# Patient Record
Sex: Male | Born: 1945
Health system: Southern US, Community
[De-identification: ages and names within clinical notes are randomized; demographics above are authoritative.]

## PROBLEM LIST (undated history)

## (undated) ENCOUNTER — Emergency Department: Discharge status: Absent without leave | Payer: Medicare HMO

## (undated) DIAGNOSIS — T8859XA Other complications of anesthesia, initial encounter: Secondary | ICD-10-CM

## (undated) DIAGNOSIS — M48061 Spinal stenosis, lumbar region without neurogenic claudication: Secondary | ICD-10-CM

## (undated) DIAGNOSIS — R252 Cramp and spasm: Secondary | ICD-10-CM

## (undated) DIAGNOSIS — F32A Depression, unspecified: Secondary | ICD-10-CM

## (undated) DIAGNOSIS — R0609 Other forms of dyspnea: Secondary | ICD-10-CM

## (undated) DIAGNOSIS — E119 Type 2 diabetes mellitus without complications: Secondary | ICD-10-CM

## (undated) DIAGNOSIS — E7800 Pure hypercholesterolemia, unspecified: Secondary | ICD-10-CM

## (undated) DIAGNOSIS — N529 Male erectile dysfunction, unspecified: Secondary | ICD-10-CM

## (undated) DIAGNOSIS — Z8489 Family history of other specified conditions: Secondary | ICD-10-CM

## (undated) DIAGNOSIS — M545 Low back pain, unspecified: Secondary | ICD-10-CM

## (undated) DIAGNOSIS — M1711 Unilateral primary osteoarthritis, right knee: Secondary | ICD-10-CM

## (undated) DIAGNOSIS — D869 Sarcoidosis, unspecified: Secondary | ICD-10-CM

## (undated) DIAGNOSIS — F329 Major depressive disorder, single episode, unspecified: Secondary | ICD-10-CM

## (undated) DIAGNOSIS — Z9889 Other specified postprocedural states: Secondary | ICD-10-CM

## (undated) DIAGNOSIS — I2510 Atherosclerotic heart disease of native coronary artery without angina pectoris: Secondary | ICD-10-CM

## (undated) DIAGNOSIS — E785 Hyperlipidemia, unspecified: Secondary | ICD-10-CM

## (undated) DIAGNOSIS — Z79899 Other long term (current) drug therapy: Secondary | ICD-10-CM

## (undated) DIAGNOSIS — M25561 Pain in right knee: Secondary | ICD-10-CM

## (undated) DIAGNOSIS — K760 Fatty (change of) liver, not elsewhere classified: Secondary | ICD-10-CM

## (undated) DIAGNOSIS — G4730 Sleep apnea, unspecified: Secondary | ICD-10-CM

## (undated) DIAGNOSIS — R413 Other amnesia: Secondary | ICD-10-CM

## (undated) DIAGNOSIS — I4510 Unspecified right bundle-branch block: Secondary | ICD-10-CM

## (undated) DIAGNOSIS — I82509 Chronic embolism and thrombosis of unspecified deep veins of unspecified lower extremity: Secondary | ICD-10-CM

## (undated) DIAGNOSIS — I10 Essential (primary) hypertension: Secondary | ICD-10-CM

## (undated) DIAGNOSIS — H25019 Cortical age-related cataract, unspecified eye: Secondary | ICD-10-CM

## (undated) DIAGNOSIS — F325 Major depressive disorder, single episode, in full remission: Secondary | ICD-10-CM

## (undated) DIAGNOSIS — Z7960 Long term (current) use of unspecified immunomodulators and immunosuppressants: Secondary | ICD-10-CM

## (undated) DIAGNOSIS — G8929 Other chronic pain: Secondary | ICD-10-CM

## (undated) DIAGNOSIS — Z8619 Personal history of other infectious and parasitic diseases: Secondary | ICD-10-CM

## (undated) DIAGNOSIS — G2581 Restless legs syndrome: Secondary | ICD-10-CM

## (undated) DIAGNOSIS — E669 Obesity, unspecified: Secondary | ICD-10-CM

## (undated) DIAGNOSIS — K649 Unspecified hemorrhoids: Secondary | ICD-10-CM

## (undated) DIAGNOSIS — F419 Anxiety disorder, unspecified: Secondary | ICD-10-CM

## (undated) DIAGNOSIS — Z7901 Long term (current) use of anticoagulants: Secondary | ICD-10-CM

## (undated) DIAGNOSIS — T4145XA Adverse effect of unspecified anesthetic, initial encounter: Secondary | ICD-10-CM

## (undated) DIAGNOSIS — I700 Atherosclerosis of aorta: Secondary | ICD-10-CM

## (undated) DIAGNOSIS — G4700 Insomnia, unspecified: Secondary | ICD-10-CM

## (undated) DIAGNOSIS — M1990 Unspecified osteoarthritis, unspecified site: Secondary | ICD-10-CM

## (undated) DIAGNOSIS — R110 Nausea: Secondary | ICD-10-CM

## (undated) DIAGNOSIS — E118 Type 2 diabetes mellitus with unspecified complications: Secondary | ICD-10-CM

## (undated) DIAGNOSIS — R918 Other nonspecific abnormal finding of lung field: Secondary | ICD-10-CM

## (undated) DIAGNOSIS — K219 Gastro-esophageal reflux disease without esophagitis: Secondary | ICD-10-CM

## (undated) DIAGNOSIS — I82409 Acute embolism and thrombosis of unspecified deep veins of unspecified lower extremity: Secondary | ICD-10-CM

## (undated) HISTORY — PX: EYE SURGERY: SHX253

## (undated) HISTORY — PX: CATARACT EXTRACTION, BILATERAL: SHX1313

## (undated) HISTORY — PX: TONSILLECTOMY: SUR1361

---

## 2010-06-14 ENCOUNTER — Other Ambulatory Visit: Payer: Self-pay | Admitting: Orthopedic Surgery

## 2010-06-14 ENCOUNTER — Other Ambulatory Visit: Payer: Self-pay

## 2010-06-14 DIAGNOSIS — M25531 Pain in right wrist: Secondary | ICD-10-CM

## 2010-10-29 ENCOUNTER — Other Ambulatory Visit (HOSPITAL_COMMUNITY): Payer: Self-pay | Admitting: Physical Medicine and Rehabilitation

## 2010-10-29 DIAGNOSIS — M545 Low back pain: Secondary | ICD-10-CM

## 2010-11-11 ENCOUNTER — Ambulatory Visit (HOSPITAL_COMMUNITY)
Admission: RE | Admit: 2010-11-11 | Discharge: 2010-11-11 | Disposition: A | Discharge status: Home / Self Care | Payer: Managed Care, Other (non HMO) | Source: Ambulatory Visit | Attending: Physical Medicine and Rehabilitation | Admitting: Physical Medicine and Rehabilitation

## 2010-11-11 ENCOUNTER — Encounter (HOSPITAL_COMMUNITY)
Admission: RE | Admit: 2010-11-11 | Discharge: 2010-11-11 | Disposition: A | Discharge status: Home / Self Care | Payer: Managed Care, Other (non HMO) | Source: Ambulatory Visit | Attending: Physical Medicine and Rehabilitation | Admitting: Physical Medicine and Rehabilitation

## 2010-11-11 DIAGNOSIS — M545 Low back pain, unspecified: Secondary | ICD-10-CM | POA: Insufficient documentation

## 2010-11-11 DIAGNOSIS — G8929 Other chronic pain: Secondary | ICD-10-CM | POA: Insufficient documentation

## 2010-11-11 DIAGNOSIS — R948 Abnormal results of function studies of other organs and systems: Secondary | ICD-10-CM | POA: Insufficient documentation

## 2010-11-11 MED ORDER — TECHNETIUM TC 99M MEDRONATE IV KIT
25.0000 | PACK | Freq: Once | INTRAVENOUS | Status: AC | PRN
  Administered 2010-11-11: 25 via INTRAVENOUS

## 2011-03-20 ENCOUNTER — Ambulatory Visit
Admission: RE | Admit: 2011-03-20 | Discharge: 2011-03-20 | Disposition: A | Discharge status: Home / Self Care | Payer: Managed Care, Other (non HMO) | Source: Ambulatory Visit | Attending: Neurosurgery | Admitting: Neurosurgery

## 2011-03-20 ENCOUNTER — Other Ambulatory Visit: Payer: Self-pay | Admitting: Neurosurgery

## 2011-03-20 DIAGNOSIS — M545 Low back pain: Secondary | ICD-10-CM

## 2012-04-14 HISTORY — PX: ACHILLES TENDON REPAIR: SUR1153

## 2013-12-09 DIAGNOSIS — M545 Low back pain, unspecified: Secondary | ICD-10-CM | POA: Insufficient documentation

## 2013-12-09 DIAGNOSIS — I10 Essential (primary) hypertension: Secondary | ICD-10-CM | POA: Insufficient documentation

## 2013-12-09 DIAGNOSIS — M533 Sacrococcygeal disorders, not elsewhere classified: Secondary | ICD-10-CM | POA: Insufficient documentation

## 2013-12-28 DIAGNOSIS — M48061 Spinal stenosis, lumbar region without neurogenic claudication: Secondary | ICD-10-CM | POA: Insufficient documentation

## 2013-12-28 DIAGNOSIS — M47816 Spondylosis without myelopathy or radiculopathy, lumbar region: Secondary | ICD-10-CM | POA: Insufficient documentation

## 2014-05-30 DIAGNOSIS — F325 Major depressive disorder, single episode, in full remission: Secondary | ICD-10-CM | POA: Insufficient documentation

## 2014-05-30 DIAGNOSIS — E119 Type 2 diabetes mellitus without complications: Secondary | ICD-10-CM | POA: Insufficient documentation

## 2014-05-30 DIAGNOSIS — I10 Essential (primary) hypertension: Secondary | ICD-10-CM | POA: Insufficient documentation

## 2014-08-20 DIAGNOSIS — F5111 Primary hypersomnia: Secondary | ICD-10-CM | POA: Diagnosis not present

## 2014-08-20 DIAGNOSIS — G4710 Hypersomnia, unspecified: Secondary | ICD-10-CM | POA: Diagnosis not present

## 2014-09-01 DIAGNOSIS — G4733 Obstructive sleep apnea (adult) (pediatric): Secondary | ICD-10-CM | POA: Diagnosis not present

## 2014-09-19 DIAGNOSIS — H43313 Vitreous membranes and strands, bilateral: Secondary | ICD-10-CM | POA: Diagnosis not present

## 2014-09-19 DIAGNOSIS — H5210 Myopia, unspecified eye: Secondary | ICD-10-CM | POA: Diagnosis not present

## 2014-09-19 DIAGNOSIS — H5203 Hypermetropia, bilateral: Secondary | ICD-10-CM | POA: Diagnosis not present

## 2014-09-21 DIAGNOSIS — L219 Seborrheic dermatitis, unspecified: Secondary | ICD-10-CM | POA: Diagnosis not present

## 2014-09-21 DIAGNOSIS — L821 Other seborrheic keratosis: Secondary | ICD-10-CM | POA: Diagnosis not present

## 2014-09-21 DIAGNOSIS — D229 Melanocytic nevi, unspecified: Secondary | ICD-10-CM | POA: Diagnosis not present

## 2014-09-21 DIAGNOSIS — L718 Other rosacea: Secondary | ICD-10-CM | POA: Diagnosis not present

## 2014-09-21 DIAGNOSIS — D1800 Hemangioma unspecified site: Secondary | ICD-10-CM | POA: Diagnosis not present

## 2014-09-21 DIAGNOSIS — L905 Scar conditions and fibrosis of skin: Secondary | ICD-10-CM | POA: Diagnosis not present

## 2014-09-21 DIAGNOSIS — Z1283 Encounter for screening for malignant neoplasm of skin: Secondary | ICD-10-CM | POA: Diagnosis not present

## 2014-09-21 DIAGNOSIS — L304 Erythema intertrigo: Secondary | ICD-10-CM | POA: Diagnosis not present

## 2014-10-02 DIAGNOSIS — G4733 Obstructive sleep apnea (adult) (pediatric): Secondary | ICD-10-CM | POA: Diagnosis not present

## 2014-10-24 DIAGNOSIS — G2581 Restless legs syndrome: Secondary | ICD-10-CM | POA: Diagnosis not present

## 2014-10-24 DIAGNOSIS — G4733 Obstructive sleep apnea (adult) (pediatric): Secondary | ICD-10-CM | POA: Diagnosis not present

## 2014-10-24 DIAGNOSIS — G479 Sleep disorder, unspecified: Secondary | ICD-10-CM | POA: Diagnosis not present

## 2014-10-31 DIAGNOSIS — M654 Radial styloid tenosynovitis [de Quervain]: Secondary | ICD-10-CM | POA: Diagnosis not present

## 2014-10-31 DIAGNOSIS — M25531 Pain in right wrist: Secondary | ICD-10-CM | POA: Diagnosis not present

## 2014-10-31 DIAGNOSIS — N403 Nodular prostate with lower urinary tract symptoms: Secondary | ICD-10-CM | POA: Diagnosis not present

## 2014-10-31 DIAGNOSIS — G479 Sleep disorder, unspecified: Secondary | ICD-10-CM | POA: Diagnosis not present

## 2014-11-27 DIAGNOSIS — G4733 Obstructive sleep apnea (adult) (pediatric): Secondary | ICD-10-CM | POA: Diagnosis not present

## 2014-11-27 DIAGNOSIS — M25511 Pain in right shoulder: Secondary | ICD-10-CM | POA: Diagnosis not present

## 2014-12-01 DIAGNOSIS — G4733 Obstructive sleep apnea (adult) (pediatric): Secondary | ICD-10-CM | POA: Diagnosis not present

## 2014-12-19 DIAGNOSIS — N401 Enlarged prostate with lower urinary tract symptoms: Secondary | ICD-10-CM | POA: Diagnosis not present

## 2014-12-19 DIAGNOSIS — N138 Other obstructive and reflux uropathy: Secondary | ICD-10-CM | POA: Diagnosis not present

## 2014-12-21 DIAGNOSIS — E780 Pure hypercholesterolemia: Secondary | ICD-10-CM | POA: Diagnosis not present

## 2014-12-21 DIAGNOSIS — E119 Type 2 diabetes mellitus without complications: Secondary | ICD-10-CM | POA: Diagnosis not present

## 2014-12-21 DIAGNOSIS — Z79899 Other long term (current) drug therapy: Secondary | ICD-10-CM | POA: Diagnosis not present

## 2014-12-28 DIAGNOSIS — G8929 Other chronic pain: Secondary | ICD-10-CM | POA: Diagnosis not present

## 2014-12-28 DIAGNOSIS — M546 Pain in thoracic spine: Secondary | ICD-10-CM | POA: Diagnosis not present

## 2014-12-28 DIAGNOSIS — M25511 Pain in right shoulder: Secondary | ICD-10-CM | POA: Diagnosis not present

## 2014-12-29 DIAGNOSIS — Z125 Encounter for screening for malignant neoplasm of prostate: Secondary | ICD-10-CM | POA: Diagnosis not present

## 2014-12-29 DIAGNOSIS — G8929 Other chronic pain: Secondary | ICD-10-CM | POA: Insufficient documentation

## 2014-12-29 DIAGNOSIS — E7800 Pure hypercholesterolemia, unspecified: Secondary | ICD-10-CM | POA: Insufficient documentation

## 2014-12-29 DIAGNOSIS — E119 Type 2 diabetes mellitus without complications: Secondary | ICD-10-CM | POA: Diagnosis not present

## 2014-12-29 DIAGNOSIS — M25511 Pain in right shoulder: Secondary | ICD-10-CM

## 2014-12-29 DIAGNOSIS — Z79899 Other long term (current) drug therapy: Secondary | ICD-10-CM | POA: Diagnosis not present

## 2014-12-29 DIAGNOSIS — I10 Essential (primary) hypertension: Secondary | ICD-10-CM | POA: Diagnosis not present

## 2015-01-01 ENCOUNTER — Other Ambulatory Visit: Payer: Self-pay | Admitting: Student

## 2015-01-01 DIAGNOSIS — M25511 Pain in right shoulder: Secondary | ICD-10-CM

## 2015-01-01 DIAGNOSIS — G4733 Obstructive sleep apnea (adult) (pediatric): Secondary | ICD-10-CM | POA: Diagnosis not present

## 2015-01-02 DIAGNOSIS — R3912 Poor urinary stream: Secondary | ICD-10-CM | POA: Diagnosis not present

## 2015-01-02 DIAGNOSIS — R3915 Urgency of urination: Secondary | ICD-10-CM | POA: Diagnosis not present

## 2015-01-02 DIAGNOSIS — R351 Nocturia: Secondary | ICD-10-CM | POA: Diagnosis not present

## 2015-01-05 DIAGNOSIS — M546 Pain in thoracic spine: Secondary | ICD-10-CM | POA: Diagnosis not present

## 2015-01-09 ENCOUNTER — Ambulatory Visit: Payer: Managed Care, Other (non HMO)

## 2015-01-09 DIAGNOSIS — N138 Other obstructive and reflux uropathy: Secondary | ICD-10-CM | POA: Diagnosis not present

## 2015-01-09 DIAGNOSIS — N3281 Overactive bladder: Secondary | ICD-10-CM | POA: Diagnosis not present

## 2015-01-09 DIAGNOSIS — N401 Enlarged prostate with lower urinary tract symptoms: Secondary | ICD-10-CM | POA: Diagnosis not present

## 2015-01-10 ENCOUNTER — Ambulatory Visit
Admission: RE | Admit: 2015-01-10 | Discharge: 2015-01-10 | Disposition: A | Discharge status: Home / Self Care | Payer: Commercial Managed Care - HMO | Source: Ambulatory Visit | Attending: Student | Admitting: Student

## 2015-01-10 DIAGNOSIS — M25511 Pain in right shoulder: Secondary | ICD-10-CM | POA: Insufficient documentation

## 2015-01-10 DIAGNOSIS — M75101 Unspecified rotator cuff tear or rupture of right shoulder, not specified as traumatic: Secondary | ICD-10-CM | POA: Insufficient documentation

## 2015-01-10 DIAGNOSIS — G8929 Other chronic pain: Secondary | ICD-10-CM | POA: Insufficient documentation

## 2015-01-24 DIAGNOSIS — Z23 Encounter for immunization: Secondary | ICD-10-CM | POA: Diagnosis not present

## 2015-01-25 DIAGNOSIS — M7581 Other shoulder lesions, right shoulder: Secondary | ICD-10-CM | POA: Diagnosis not present

## 2015-01-25 DIAGNOSIS — M75121 Complete rotator cuff tear or rupture of right shoulder, not specified as traumatic: Secondary | ICD-10-CM | POA: Diagnosis not present

## 2015-01-31 DIAGNOSIS — G4733 Obstructive sleep apnea (adult) (pediatric): Secondary | ICD-10-CM | POA: Diagnosis not present

## 2015-03-03 DIAGNOSIS — G4733 Obstructive sleep apnea (adult) (pediatric): Secondary | ICD-10-CM | POA: Diagnosis not present

## 2015-03-14 DIAGNOSIS — R69 Illness, unspecified: Secondary | ICD-10-CM | POA: Diagnosis not present

## 2015-03-27 DIAGNOSIS — H2513 Age-related nuclear cataract, bilateral: Secondary | ICD-10-CM | POA: Diagnosis not present

## 2015-04-02 DIAGNOSIS — G4733 Obstructive sleep apnea (adult) (pediatric): Secondary | ICD-10-CM | POA: Diagnosis not present

## 2015-05-03 DIAGNOSIS — G4733 Obstructive sleep apnea (adult) (pediatric): Secondary | ICD-10-CM | POA: Diagnosis not present

## 2015-06-03 DIAGNOSIS — G4733 Obstructive sleep apnea (adult) (pediatric): Secondary | ICD-10-CM | POA: Diagnosis not present

## 2015-06-04 DIAGNOSIS — M75121 Complete rotator cuff tear or rupture of right shoulder, not specified as traumatic: Secondary | ICD-10-CM | POA: Diagnosis not present

## 2015-06-04 DIAGNOSIS — M7581 Other shoulder lesions, right shoulder: Secondary | ICD-10-CM | POA: Diagnosis not present

## 2015-07-03 DIAGNOSIS — Z125 Encounter for screening for malignant neoplasm of prostate: Secondary | ICD-10-CM | POA: Diagnosis not present

## 2015-07-03 DIAGNOSIS — Z79899 Other long term (current) drug therapy: Secondary | ICD-10-CM | POA: Diagnosis not present

## 2015-07-03 DIAGNOSIS — E7800 Pure hypercholesterolemia, unspecified: Secondary | ICD-10-CM | POA: Diagnosis not present

## 2015-07-03 DIAGNOSIS — E119 Type 2 diabetes mellitus without complications: Secondary | ICD-10-CM | POA: Diagnosis not present

## 2015-07-09 DIAGNOSIS — E119 Type 2 diabetes mellitus without complications: Secondary | ICD-10-CM | POA: Diagnosis not present

## 2015-07-09 DIAGNOSIS — Z79899 Other long term (current) drug therapy: Secondary | ICD-10-CM | POA: Diagnosis not present

## 2015-07-09 DIAGNOSIS — M545 Low back pain: Secondary | ICD-10-CM | POA: Diagnosis not present

## 2015-07-09 DIAGNOSIS — G8929 Other chronic pain: Secondary | ICD-10-CM | POA: Diagnosis not present

## 2015-07-09 DIAGNOSIS — Z0000 Encounter for general adult medical examination without abnormal findings: Secondary | ICD-10-CM | POA: Diagnosis not present

## 2015-07-09 DIAGNOSIS — E7800 Pure hypercholesterolemia, unspecified: Secondary | ICD-10-CM | POA: Diagnosis not present

## 2015-07-12 DIAGNOSIS — M4726 Other spondylosis with radiculopathy, lumbar region: Secondary | ICD-10-CM | POA: Diagnosis not present

## 2015-07-12 DIAGNOSIS — M5416 Radiculopathy, lumbar region: Secondary | ICD-10-CM | POA: Diagnosis not present

## 2015-07-12 DIAGNOSIS — M4806 Spinal stenosis, lumbar region: Secondary | ICD-10-CM | POA: Diagnosis not present

## 2015-07-12 DIAGNOSIS — M5136 Other intervertebral disc degeneration, lumbar region: Secondary | ICD-10-CM | POA: Diagnosis not present

## 2015-07-24 ENCOUNTER — Encounter
Admission: RE | Admit: 2015-07-24 | Discharge: 2015-07-24 | Disposition: A | Discharge status: Home / Self Care | Payer: Commercial Managed Care - HMO | Source: Ambulatory Visit | Attending: Surgery | Admitting: Surgery

## 2015-07-24 DIAGNOSIS — Z01810 Encounter for preprocedural cardiovascular examination: Secondary | ICD-10-CM | POA: Diagnosis not present

## 2015-07-24 DIAGNOSIS — Z01812 Encounter for preprocedural laboratory examination: Secondary | ICD-10-CM | POA: Insufficient documentation

## 2015-07-24 DIAGNOSIS — I10 Essential (primary) hypertension: Secondary | ICD-10-CM | POA: Diagnosis not present

## 2015-07-24 HISTORY — DX: Gastro-esophageal reflux disease without esophagitis: K21.9

## 2015-07-24 HISTORY — DX: Sleep apnea, unspecified: G47.30

## 2015-07-24 HISTORY — DX: Family history of other specified conditions: Z84.89

## 2015-07-24 HISTORY — DX: Unspecified osteoarthritis, unspecified site: M19.90

## 2015-07-24 HISTORY — DX: Major depressive disorder, single episode, unspecified: F32.9

## 2015-07-24 HISTORY — DX: Depression, unspecified: F32.A

## 2015-07-24 HISTORY — DX: Essential (primary) hypertension: I10

## 2015-07-24 LAB — POTASSIUM: POTASSIUM: 3.9 mmol/L (ref 3.5–5.1)

## 2015-07-24 NOTE — Patient Instructions (Signed)
  Your procedure is scheduled [id number] April 20 , 2017. Report to Same Day Surgery. To find out your arrival time please call [phone] between 1PM - 3PM on Wednesday August 01, 2015.  Remember: Instructions that are not followed completely may result in serious medical risk, up to and including death, or upon the discretion of your surgeon and anesthesiologist your surgery may need to be rescheduled.    _x___ 1. Do not eat food or drink liquids after midnight. No gum chewing or hard candies.     ____ 2. No Alcohol for 24 hours before or after surgery.   ____ 3. Bring all medications with you on the day of surgery if instructed.    __x__ 4. Notify your doctor if there is any change in your medical condition     (cold, fever, infections).     Do not wear jewelry, make-up, hairpins, clips or nail polish.  Do not wear lotions, powders, or perfumes.   Do not shave 48 hours prior to surgery. Men may shave face and neck.  Do not bring valuables to the hospital.    [facility] [place] is not responsible for any belongings or valuables.               Contacts, dentures or bridgework may not be worn into surgery.  Leave your suitcase in the car. After surgery it may be brought to your room.  For patients admitted to the hospital, discharge time is determined by your treatment team.   Patients discharged the day of surgery will not be allowed to drive home.    Please read over the following fact sheets that you were given:   [facility] Preparing for Surgery  __x__ Take these medicines the morning of surgery with A SIP OF WATER:    1. amLODipine (NORVASC)  2. atorvastatin (LIPITOR)   3. citalopram (CELEXA)  4.omeprazole (PRILOSEC)   ____ Fleet Enema (as directed)   _x___ Use CHG Soap as directed on instruction sheet  ____ Use inhalers on the day of surgery and bring to hospital day of surgery  ____ Stop metformin 2 days prior to surgery    ____ Take 1/2 of usual insulin dose the  night before surgery and none on the morning of surgery.   _x___ Stop aspirin 7 days prior to surgery.  _x___ Stop Anti-inflammatories such as Meloxicam Advil, Aleve, Ibuprofen, Motrin, Naproxen, Naprosyn, Goodies powders or aspirin products. OK to take  tylenol.   ____ Stop supplements until after surgery.    _x___ Bring C-Pap to the hospital.

## 2015-08-01 MED ORDER — LACTATED RINGERS IV SOLN: INTRAVENOUS | Status: DC

## 2015-08-02 ENCOUNTER — Ambulatory Visit: Payer: Commercial Managed Care - HMO | Admitting: Anesthesiology

## 2015-08-02 ENCOUNTER — Encounter: Payer: Self-pay | Admitting: Anesthesiology

## 2015-08-02 ENCOUNTER — Encounter
Admission: RE | Disposition: A | Discharge status: Home / Self Care | Payer: Self-pay | Source: Ambulatory Visit | Attending: Surgery

## 2015-08-02 ENCOUNTER — Ambulatory Visit
Admission: RE | Admit: 2015-08-02 | Discharge: 2015-08-02 | Disposition: A | Discharge status: Home / Self Care | Payer: Commercial Managed Care - HMO | Source: Ambulatory Visit | Attending: Surgery | Admitting: Surgery

## 2015-08-02 DIAGNOSIS — I10 Essential (primary) hypertension: Secondary | ICD-10-CM | POA: Insufficient documentation

## 2015-08-02 DIAGNOSIS — M94211 Chondromalacia, right shoulder: Secondary | ICD-10-CM | POA: Insufficient documentation

## 2015-08-02 DIAGNOSIS — Z8249 Family history of ischemic heart disease and other diseases of the circulatory system: Secondary | ICD-10-CM | POA: Insufficient documentation

## 2015-08-02 DIAGNOSIS — Z9889 Other specified postprocedural states: Secondary | ICD-10-CM | POA: Insufficient documentation

## 2015-08-02 DIAGNOSIS — Z7982 Long term (current) use of aspirin: Secondary | ICD-10-CM | POA: Diagnosis not present

## 2015-08-02 DIAGNOSIS — M75101 Unspecified rotator cuff tear or rupture of right shoulder, not specified as traumatic: Secondary | ICD-10-CM | POA: Insufficient documentation

## 2015-08-02 DIAGNOSIS — Z880 Allergy status to penicillin: Secondary | ICD-10-CM | POA: Insufficient documentation

## 2015-08-02 DIAGNOSIS — Z801 Family history of malignant neoplasm of trachea, bronchus and lung: Secondary | ICD-10-CM | POA: Diagnosis not present

## 2015-08-02 DIAGNOSIS — Z833 Family history of diabetes mellitus: Secondary | ICD-10-CM | POA: Diagnosis not present

## 2015-08-02 DIAGNOSIS — M7541 Impingement syndrome of right shoulder: Secondary | ICD-10-CM | POA: Diagnosis not present

## 2015-08-02 DIAGNOSIS — M7581 Other shoulder lesions, right shoulder: Secondary | ICD-10-CM | POA: Diagnosis not present

## 2015-08-02 DIAGNOSIS — G8918 Other acute postprocedural pain: Secondary | ICD-10-CM | POA: Diagnosis not present

## 2015-08-02 DIAGNOSIS — F329 Major depressive disorder, single episode, unspecified: Secondary | ICD-10-CM | POA: Diagnosis not present

## 2015-08-02 DIAGNOSIS — E119 Type 2 diabetes mellitus without complications: Secondary | ICD-10-CM | POA: Diagnosis not present

## 2015-08-02 DIAGNOSIS — Z791 Long term (current) use of non-steroidal anti-inflammatories (NSAID): Secondary | ICD-10-CM | POA: Insufficient documentation

## 2015-08-02 DIAGNOSIS — Z5333 Arthroscopic surgical procedure converted to open procedure: Secondary | ICD-10-CM | POA: Diagnosis not present

## 2015-08-02 DIAGNOSIS — M75121 Complete rotator cuff tear or rupture of right shoulder, not specified as traumatic: Secondary | ICD-10-CM | POA: Diagnosis not present

## 2015-08-02 DIAGNOSIS — Z79899 Other long term (current) drug therapy: Secondary | ICD-10-CM | POA: Diagnosis not present

## 2015-08-02 DIAGNOSIS — M25511 Pain in right shoulder: Secondary | ICD-10-CM | POA: Diagnosis not present

## 2015-08-02 HISTORY — PX: SHOULDER ARTHROSCOPY WITH OPEN ROTATOR CUFF REPAIR: SHX6092

## 2015-08-02 LAB — GLUCOSE, CAPILLARY
Glucose-Capillary: 134 mg/dL — ABNORMAL HIGH (ref 65–99)
Glucose-Capillary: 124 mg/dL — ABNORMAL HIGH (ref 65–99)

## 2015-08-02 SURGERY — ARTHROSCOPY, SHOULDER WITH REPAIR, ROTATOR CUFF, OPEN
Anesthesia: General | Site: Shoulder | Laterality: Right | Wound class: Clean

## 2015-08-02 MED ORDER — BUPIVACAINE-EPINEPHRINE 0.5% -1:200000 IJ SOLN
INTRAMUSCULAR | Status: DC | PRN
  Administered 2015-08-02: 30 mL

## 2015-08-02 MED ORDER — ROPIVACAINE HCL 5 MG/ML IJ SOLN
INTRAMUSCULAR | Status: AC
  Filled 2015-08-02: qty 40

## 2015-08-02 MED ORDER — FENTANYL CITRATE (PF) 100 MCG/2ML IJ SOLN: 25.0000 ug | INTRAMUSCULAR | Status: DC | PRN

## 2015-08-02 MED ORDER — GLYCOPYRROLATE 0.2 MG/ML IJ SOLN
INTRAMUSCULAR | Status: DC | PRN
  Administered 2015-08-02: 0.2 mg via INTRAVENOUS

## 2015-08-02 MED ORDER — SODIUM CHLORIDE 0.9 % IV SOLN
INTRAVENOUS | Status: DC
  Administered 2015-08-02: 07:00:00 via INTRAVENOUS

## 2015-08-02 MED ORDER — SUGAMMADEX SODIUM 200 MG/2ML IV SOLN
INTRAVENOUS | Status: DC | PRN
  Administered 2015-08-02: 213.2 mg via INTRAVENOUS

## 2015-08-02 MED ORDER — OXYCODONE HCL 5 MG PO TABS: 5.0000 mg | ORAL_TABLET | Freq: Once | ORAL | Status: DC | PRN

## 2015-08-02 MED ORDER — ONDANSETRON HCL 4 MG/2ML IJ SOLN
INTRAMUSCULAR | Status: DC | PRN
  Administered 2015-08-02: 4 mg via INTRAVENOUS

## 2015-08-02 MED ORDER — LACTATED RINGERS IV SOLN
INTRAVENOUS | Status: DC | PRN
  Administered 2015-08-02 (×2): via INTRAVENOUS

## 2015-08-02 MED ORDER — BUPIVACAINE-EPINEPHRINE (PF) 0.5% -1:200000 IJ SOLN
INTRAMUSCULAR | Status: AC
  Filled 2015-08-02: qty 30

## 2015-08-02 MED ORDER — CLINDAMYCIN PHOSPHATE 900 MG/50ML IV SOLN: 900.0000 mg | Freq: Once | INTRAVENOUS | Status: DC

## 2015-08-02 MED ORDER — FENTANYL CITRATE (PF) 100 MCG/2ML IJ SOLN
INTRAMUSCULAR | Status: DC | PRN
  Administered 2015-08-02 (×2): 50 ug via INTRAVENOUS

## 2015-08-02 MED ORDER — OXYCODONE HCL 5 MG PO TABS: 5.0000 mg | ORAL_TABLET | ORAL | Status: DC | PRN

## 2015-08-02 MED ORDER — SODIUM CHLORIDE 0.9 % IV SOLN
10000.0000 ug | INTRAVENOUS | Status: DC | PRN
  Administered 2015-08-02: 10 ug/min via INTRAVENOUS

## 2015-08-02 MED ORDER — LIDOCAINE HCL (CARDIAC) 20 MG/ML IV SOLN
INTRAVENOUS | Status: DC | PRN
  Administered 2015-08-02: 20 mg via INTRAVENOUS

## 2015-08-02 MED ORDER — CLINDAMYCIN PHOSPHATE 900 MG/50ML IV SOLN
INTRAVENOUS | Status: AC
  Administered 2015-08-02: 900 mg via INTRAVENOUS
  Filled 2015-08-02: qty 50

## 2015-08-02 MED ORDER — EPINEPHRINE HCL 1 MG/ML IJ SOLN
INTRAMUSCULAR | Status: AC
  Filled 2015-08-02: qty 3

## 2015-08-02 MED ORDER — OXYCODONE HCL 5 MG/5ML PO SOLN: 5.0000 mg | Freq: Once | ORAL | Status: DC | PRN

## 2015-08-02 MED ORDER — ROPIVACAINE HCL 5 MG/ML IJ SOLN
INTRAMUSCULAR | Status: DC | PRN
  Administered 2015-08-02 (×3): 10 mL via PERINEURAL

## 2015-08-02 MED ORDER — MIDAZOLAM HCL 2 MG/2ML IJ SOLN
INTRAMUSCULAR | Status: DC | PRN
  Administered 2015-08-02: 2 mg via INTRAVENOUS

## 2015-08-02 MED ORDER — EPINEPHRINE HCL 1 MG/ML IJ SOLN
INTRAMUSCULAR | Status: DC | PRN
  Administered 2015-08-02: 2 mL

## 2015-08-02 MED ORDER — PROPOFOL 10 MG/ML IV BOLUS
INTRAVENOUS | Status: DC | PRN
  Administered 2015-08-02: 180 mg via INTRAVENOUS

## 2015-08-02 MED ORDER — SUCCINYLCHOLINE CHLORIDE 20 MG/ML IJ SOLN
INTRAMUSCULAR | Status: DC | PRN
  Administered 2015-08-02: 120 mg via INTRAVENOUS

## 2015-08-02 MED ORDER — ROCURONIUM BROMIDE 100 MG/10ML IV SOLN
INTRAVENOUS | Status: DC | PRN
  Administered 2015-08-02: 30 mg via INTRAVENOUS
  Administered 2015-08-02: 10 mg via INTRAVENOUS

## 2015-08-02 SURGICAL SUPPLY — 48 items
BIT DRILL JUGRKNT W/NDL BIT2.9 (DRILL) IMPLANT
BLADE FULL RADIUS 3.5 (BLADE) ×3 IMPLANT
BLADE SHAVER 4.5X7 STR FR (MISCELLANEOUS) IMPLANT
BUR ACROMIONIZER 4.0 (BURR) ×3 IMPLANT
BUR BR 5.5 WIDE MOUTH (BURR) IMPLANT
CANNULA 8.5X75 THRED (CANNULA) ×3 IMPLANT
CANNULA SHAVER 8MMX76MM (CANNULA) IMPLANT
CHLORAPREP W/TINT 26ML (MISCELLANEOUS) ×6 IMPLANT
COVER MAYO STAND STRL (DRAPES) ×3 IMPLANT
DECANTER SPIKE VIAL GLASS SM (MISCELLANEOUS) ×3 IMPLANT
DRAPE IMP U-DRAPE 54X76 (DRAPES) ×3 IMPLANT
DRAPE SURG 17X11 SM STRL (DRAPES) ×3 IMPLANT
DRILL JUGGERKNOT W/NDL BIT 2.9 (DRILL)
DRSG OPSITE POSTOP 4X8 (GAUZE/BANDAGES/DRESSINGS) IMPLANT
ELECT REM PT RETURN 9FT ADLT (ELECTROSURGICAL) ×3
ELECTRODE REM PT RTRN 9FT ADLT (ELECTROSURGICAL) ×1 IMPLANT
GAUZE PETRO XEROFOAM 1X8 (MISCELLANEOUS) ×3 IMPLANT
GAUZE SPONGE 4X4 12PLY STRL (GAUZE/BANDAGES/DRESSINGS) ×3 IMPLANT
GLOVE BIO SURGEON STRL SZ7.5 (GLOVE) ×6 IMPLANT
GLOVE BIO SURGEON STRL SZ8 (GLOVE) ×6 IMPLANT
GLOVE BIOGEL PI IND STRL 8 (GLOVE) ×1 IMPLANT
GLOVE BIOGEL PI INDICATOR 8 (GLOVE) ×2
GLOVE INDICATOR 8.0 STRL GRN (GLOVE) ×3 IMPLANT
GOWN STRL REUS W/ TWL LRG LVL3 (GOWN DISPOSABLE) ×2 IMPLANT
GOWN STRL REUS W/ TWL XL LVL3 (GOWN DISPOSABLE) ×1 IMPLANT
GOWN STRL REUS W/TWL LRG LVL3 (GOWN DISPOSABLE) ×4
GOWN STRL REUS W/TWL XL LVL3 (GOWN DISPOSABLE) ×2
GRASPER SUT 15 45D LOW PRO (SUTURE) IMPLANT
IV LACTATED RINGER IRRG 3000ML (IV SOLUTION) ×4
IV LR IRRIG 3000ML ARTHROMATIC (IV SOLUTION) ×2 IMPLANT
MANIFOLD NEPTUNE II (INSTRUMENTS) ×3 IMPLANT
MASK FACE SPIDER DISP (MASK) ×3 IMPLANT
MAT BLUE FLOOR 46X72 FLO (MISCELLANEOUS) ×3 IMPLANT
NEEDLE REVERSE CUT 1/2 CRC (NEEDLE) IMPLANT
PACK ARTHROSCOPY SHOULDER (MISCELLANEOUS) ×3 IMPLANT
SLING ARM LRG DEEP (SOFTGOODS) ×6 IMPLANT
SLING ULTRA II LG (MISCELLANEOUS) IMPLANT
STAPLER SKIN PROX 35W (STAPLE) ×3 IMPLANT
STRAP SAFETY BODY (MISCELLANEOUS) ×3 IMPLANT
SUT ETHIBOND 0 MO6 C/R (SUTURE) ×3 IMPLANT
SUT PROLENE 4 0 PS 2 18 (SUTURE) ×3 IMPLANT
SUT VIC AB 2-0 CT1 27 (SUTURE) ×4
SUT VIC AB 2-0 CT1 TAPERPNT 27 (SUTURE) ×2 IMPLANT
TAPE MICROFOAM 4IN (TAPE) ×3 IMPLANT
TUBING ARTHRO INFLOW-ONLY STRL (TUBING) ×3 IMPLANT
TUBING CONNECTING 10 (TUBING) ×2 IMPLANT
TUBING CONNECTING 10' (TUBING) ×1
WAND HAND CNTRL MULTIVAC 90 (MISCELLANEOUS) ×3 IMPLANT

## 2015-08-02 NOTE — H&P (Signed)
Paper H&P to be scanned into permanent record. H&P reviewed. No changes. 

## 2015-08-02 NOTE — Anesthesia Postprocedure Evaluation (Signed)
Anesthesia Post Note  Patient: Dominque Kravchuk  Procedure(s) Performed: Procedure(s) (LRB): SHOULDER ARTHROSCOPY WITH LIMITED DEBRIDEMENT,  MINI OPEN ROTATOR CUFF REPAIR, DECOMPRESSION (Right)  Patient location during evaluation: PACU Anesthesia Type: General Level of consciousness: awake and alert Pain management: pain level controlled Vital Signs Assessment: post-procedure vital signs reviewed and stable Respiratory status: spontaneous breathing, nonlabored ventilation, respiratory function stable and patient connected to nasal cannula oxygen Cardiovascular status: blood pressure returned to baseline and stable Postop Assessment: no signs of nausea or vomiting Anesthetic complications: no    Last Vitals:  Filed Vitals:   08/02/15 1005 08/02/15 1019  BP: 109/60 125/62  Pulse: 72 71  Temp: 36.4 C 36.1 C  Resp: 19 18    Last Pain: There were no vitals filed for this visit.               Precious Haws Piscitello

## 2015-08-02 NOTE — Anesthesia Preprocedure Evaluation (Signed)
Anesthesia Evaluation  Patient identified by MRN, date of birth, ID band Patient awake    Reviewed: Allergy & Precautions, H&P , NPO status , Patient's Chart, lab work & pertinent test results  History of Anesthesia Complications Negative for: history of anesthetic complications  Airway Mallampati: III  TM Distance: >3 FB Neck ROM: full    Dental  (+) Poor Dentition   Pulmonary neg shortness of breath, sleep apnea and Continuous Positive Airway Pressure Ventilation ,    Pulmonary exam normal breath sounds clear to auscultation       Cardiovascular Exercise Tolerance: Good hypertension, (-) angina(-) Past MI and (-) DOE negative cardio ROS Normal cardiovascular exam Rhythm:regular Rate:Normal     Neuro/Psych PSYCHIATRIC DISORDERS Depression negative neurological ROS     GI/Hepatic Neg liver ROS, GERD  Controlled,  Endo/Other  negative endocrine ROS  Renal/GU negative Renal ROS  negative genitourinary   Musculoskeletal  (+) Arthritis ,   Abdominal   Peds  Hematology negative hematology ROS (+)   Anesthesia Other Findings Past Medical History:   Family history of adverse reaction to anesthes*                Comment:daughter nausea   Hypertension                                                 Depression                                                   GERD (gastroesophageal reflux disease)                       Arthritis                                                      Comment:back   Sleep apnea                                                 Past Surgical History:   TONSILLECTOMY                                                 ACHILLES TENDON REPAIR                          Right 2014           Reproductive/Obstetrics negative OB ROS                             Anesthesia Physical Anesthesia Plan  ASA: III  Anesthesia Plan: General ETT   Post-op Pain Management: GA  combined w/ Regional for post-op pain   Induction:   Airway Management  Planned:   Additional Equipment:   Intra-op Plan:   Post-operative Plan:   Informed Consent: I have reviewed the patients History and Physical, chart, labs and discussed the procedure including the risks, benefits and alternatives for the proposed anesthesia with the patient or authorized representative who has indicated his/her understanding and acceptance.   Dental Advisory Given  Plan Discussed with: Anesthesiologist, CRNA and Surgeon  Anesthesia Plan Comments:         Anesthesia Quick Evaluation

## 2015-08-02 NOTE — Transfer of Care (Signed)
Immediate Anesthesia Transfer of Care Note  Patient: George Daniels  Procedure(s) Performed: Procedure(s): SHOULDER ARTHROSCOPY WITH LIMITED DEBRIDEMENT,  MINI OPEN ROTATOR CUFF REPAIR, DECOMPRESSION (Right)  Patient Location: PACU  Anesthesia Type:General  Level of Consciousness: awake, alert  and oriented  Airway & Oxygen Therapy: Patient Spontanous Breathing  Post-op Assessment: Report given to RN and Post -op Vital signs reviewed and stable  Post vital signs: Reviewed and stable  Last Vitals:  Filed Vitals:   08/02/15 0612 08/02/15 0920  BP:  116/64  Pulse:  76  Temp: 36.2 C 36.1 C  Resp:  13    Complications: No apparent anesthesia complications

## 2015-08-02 NOTE — Anesthesia Procedure Notes (Addendum)
Anesthesia Regional Block:  Interscalene brachial plexus block  Pre-Anesthetic Checklist: ,, timeout performed, Correct Patient, Correct Site, Correct Laterality, Correct Procedure, Correct Position, site marked, Risks and benefits discussed,  Surgical consent,  Pre-op evaluation,  At surgeon's request and post-op pain management  Laterality: Upper and Right  Prep: chloraprep       Needles:  Injection technique: Single-shot  Needle Type: Stimiplex     Needle Length: 5cm 5 cm Needle Gauge: 22 and 22 G    Additional Needles:  Procedures: ultrasound guided (picture in chart) Interscalene brachial plexus block Narrative:  Start time: 08/02/2015 7:32 AM End time: 08/02/2015 7:35 AM Injection made incrementally with aspirations every 5 mL.  Performed by: Personally  Anesthesiologist: Katy Fitch K  Additional Notes: Functioning IV was confirmed and monitors were applied.  A 64mm 22ga Stimuplex needle was used. Sterile prep,hand hygiene and sterile gloves were used.  Negative aspiration and negative test dose prior to incremental administration of local anesthetic. The patient tolerated the procedure well with no immediate complications.  Patient endorses baseline weakness from injury in right shoulder.   Procedure Name: Intubation Date/Time: 08/02/2015 7:50 AM Performed by: Jennette Bill Pre-anesthesia Checklist: Patient identified, Emergency Drugs available, Suction available, Patient being monitored and Timeout performed Patient Re-evaluated:Patient Re-evaluated prior to inductionOxygen Delivery Method: Circle system utilized Preoxygenation: Pre-oxygenation with 100% oxygen Intubation Type: IV induction Ventilation: Mask ventilation without difficulty Laryngoscope Size: 3 Grade View: Grade II Tube type: Oral Tube size: 7.5 mm Number of attempts: 1 Placement Confirmation: ETT inserted through vocal cords under direct vision,  positive ETCO2 and breath sounds checked-  equal and bilateral Secured at: 24 cm Dental Injury: Teeth and Oropharynx as per pre-operative assessment

## 2015-08-02 NOTE — Discharge Instructions (Addendum)
Keep dressing dry and intact.  May shower after dressing changed on post-op day #4 (Monday).  Cover staples with Band-Aids after drying off and reapply shoulder immobilizer. Apply ice frequently to shoulder. Keep shoulder immobilizer on at all times except may remove for bathing purposes. Follow-up in 10-14 days or as scheduled.  AMBULATORY SURGERY  DISCHARGE INSTRUCTIONS  1) The drugs that you were given will stay in your system until tomorrow so for the next 24 hours you should not:  A) Drive an automobile B) Make any legal decisions C) Drink any alcoholic beverage  2) You may resume regular meals tomorrow.  Today it is better to start with liquids and gradually work up to solid foods. You may eat anything you prefer, but it is better to start with liquids, then soup and crackers, and gradually work up to solid foods.  3) Please notify your doctor immediately if you have any unusual bleeding, trouble breathing, redness and pain at the surgery site, drainage, fever, or pain not relieved by medication.  4) Additional Instructions:  Please contact your physician with any problems or Same Day Surgery at [phone], Monday through Friday 6 am to 4 pm, or [place] [facility] number at [phone].

## 2015-08-02 NOTE — Op Note (Signed)
08/02/2015  9:21 AM  Patient:   George Daniels  Pre-Op Diagnosis:   Impingement/tendinopathy with rotator cuff tear, right shoulder.  Postoperative diagnosis: Impingement/tendinopathy with bursal surface tear of supraspinatus and intramuscular tear of infraspinatus and labral fraying, right shoulder.  Procedure: Limited arthroscopic debridement, arthroscopic subacromial decompression, and mini-open repairs of supraspinatus and infraspinatus tears, right shoulder.  Anesthesia: General endotracheal with interscalene block placed preoperatively by the anesthesiologist.  Surgeon:   Pascal Lux, MD  Assistant:   None  Findings: As above. There was some fraying of the anterior and postero-superior portions of the labrum without detachment from the glenoid. There was grade 1-2 chondromalacia involving the central portion of the glenoid, but the articular surface of the humeral head was in excellent condition. The biceps tendon and articular portion of the rotator cuff both were in excellent condition as well.  Complications: None  Fluids:   2000 cc  Estimated blood loss: 5 cc  Tourniquet time: None  Drains: None  Closure: Staples   Brief clinical note: The patient is a 70 year old male with a long history of gradually worsening right shoulder pain. The patient's symptoms have progressed despite medications, activity modification, etc. The patient's history and examination are consistent with impingement/tendinopathy with a rotator cuff tear. These findings were confirmed by MRI scan. The patient presents at this time for definitive management of these shoulder symptoms.  Procedure: The patient was brought into the operating room and lain in the supine position. After adequate IV sedation was achieved, the patient underwent placement of an interscalene block by the anesthesiologist. The patient then underwent general endotracheal intubation and anesthesia before  being repositioned in the beach chair position using the beach chair positioner. The right shoulder and upper extremity were prepped with ChloraPrep solution before being draped sterilely. Preoperative antibiotics were administered. A timeout was performed to confirm the proper surgical site before the expected portal sites and incision site were injected with 0.5% Sensorcaine with epinephrine. A posterior portal was created and the glenohumeral joint thoroughly inspected with the findings as described above. An anterior portal was created using an outside-in technique. The labrum and rotator cuff were further probed, again confirming the above-noted findings. The areas of labral fraying anteriorly and posterior superiorly were debrided using the full-radius resector. The ArthroCare wand was inserted and used to obtain hemostasis as well as to "anneal" the labrum superiorly and anteriorly. The instruments were removed from the joint after suctioning the excess fluid.  The camera was repositioned through the posterior portal into the subacromial space. A separate lateral portal was created using an outside-in technique. The 3.5 mm full-radius resector was introduced and used to perform a subtotal bursectomy. The ArthroCare wand was then inserted and used to remove the periosteal tissue off the undersurface of the anterior third of the acromion as well as to recess the coracoacromial ligament from its attachment along the anterior and lateral margins of the acromion. The 4.0 mm acromionizing bur was introduced and used to complete the decompression by removing the undersurface of the anterior third of the acromion. The full radius resector was reintroduced to remove any residual bony debris before the ArthroCare wand was reintroduced to obtain hemostasis. The instruments were then removed from the subacromial space after suctioning the excess fluid.  An approximately 4-5 cm incision was made over the anterolateral  aspect of the shoulder beginning at the anterolateral corner of the acromion and extending distally in line with the bicipital groove. This incision was carried  down through the subcutaneous tissues to expose the deltoid fascia. The raphae between the anterior and middle thirds was identified and this plane developed to provide access into the subacromial space. Additional bursal tissues were debrided sharply using Metzenbaum scissors. The  small full-thickness intramuscular tear of the infraspinatus was identified and repaired using a single #0 Ethibond suture placed in a side-to-side fashion. Laterally, there was a bursal surface tear involving the anterior insertional fibers of the supraspinatus tendon. This was repaired using a second #0 Ethibond interrupted suture. Apparent watertight closures were obtained for both repairs.  The wound was copiously irrigated with sterile saline solution before the deltoid raphae was reapproximated using 2-0 Vicryl interrupted sutures. The subcutaneous tissues were closed in two layers using 2-0 Vicryl interrupted sutures before the skin was closed using staples. The portal sites also were closed using staples. A sterile bulky dressing was applied to the shoulder before the arm was placed into a shoulder immobilizer. The patient was then awakened, extubated, and returned to the recovery room in satisfactory condition after tolerating the procedure well.

## 2015-08-03 ENCOUNTER — Encounter: Payer: Self-pay | Admitting: Surgery

## 2015-08-07 DIAGNOSIS — M25511 Pain in right shoulder: Secondary | ICD-10-CM | POA: Diagnosis not present

## 2015-08-15 DIAGNOSIS — M25511 Pain in right shoulder: Secondary | ICD-10-CM | POA: Diagnosis not present

## 2015-08-22 DIAGNOSIS — M25511 Pain in right shoulder: Secondary | ICD-10-CM | POA: Diagnosis not present

## 2015-09-05 DIAGNOSIS — M25511 Pain in right shoulder: Secondary | ICD-10-CM | POA: Diagnosis not present

## 2015-09-13 DIAGNOSIS — M25511 Pain in right shoulder: Secondary | ICD-10-CM | POA: Diagnosis not present

## 2015-09-18 DIAGNOSIS — M25511 Pain in right shoulder: Secondary | ICD-10-CM | POA: Diagnosis not present

## 2015-09-20 DIAGNOSIS — M25511 Pain in right shoulder: Secondary | ICD-10-CM | POA: Diagnosis not present

## 2015-09-25 DIAGNOSIS — Z9889 Other specified postprocedural states: Secondary | ICD-10-CM | POA: Diagnosis not present

## 2015-09-25 DIAGNOSIS — M25511 Pain in right shoulder: Secondary | ICD-10-CM | POA: Diagnosis not present

## 2015-09-25 DIAGNOSIS — R29898 Other symptoms and signs involving the musculoskeletal system: Secondary | ICD-10-CM | POA: Diagnosis not present

## 2015-09-27 DIAGNOSIS — M25511 Pain in right shoulder: Secondary | ICD-10-CM | POA: Diagnosis not present

## 2015-10-02 DIAGNOSIS — M25511 Pain in right shoulder: Secondary | ICD-10-CM | POA: Diagnosis not present

## 2015-10-05 DIAGNOSIS — M25511 Pain in right shoulder: Secondary | ICD-10-CM | POA: Diagnosis not present

## 2015-10-08 DIAGNOSIS — M25511 Pain in right shoulder: Secondary | ICD-10-CM | POA: Diagnosis not present

## 2015-10-10 DIAGNOSIS — M25511 Pain in right shoulder: Secondary | ICD-10-CM | POA: Diagnosis not present

## 2015-10-17 DIAGNOSIS — M25511 Pain in right shoulder: Secondary | ICD-10-CM | POA: Diagnosis not present

## 2015-10-19 DIAGNOSIS — K1379 Other lesions of oral mucosa: Secondary | ICD-10-CM | POA: Diagnosis not present

## 2015-10-19 DIAGNOSIS — M25511 Pain in right shoulder: Secondary | ICD-10-CM | POA: Diagnosis not present

## 2015-10-23 DIAGNOSIS — M25511 Pain in right shoulder: Secondary | ICD-10-CM | POA: Diagnosis not present

## 2015-10-26 DIAGNOSIS — M25511 Pain in right shoulder: Secondary | ICD-10-CM | POA: Diagnosis not present

## 2015-10-30 DIAGNOSIS — M25511 Pain in right shoulder: Secondary | ICD-10-CM | POA: Diagnosis not present

## 2015-11-02 DIAGNOSIS — M25511 Pain in right shoulder: Secondary | ICD-10-CM | POA: Diagnosis not present

## 2015-11-05 DIAGNOSIS — M75121 Complete rotator cuff tear or rupture of right shoulder, not specified as traumatic: Secondary | ICD-10-CM | POA: Diagnosis not present

## 2015-11-05 DIAGNOSIS — M7581 Other shoulder lesions, right shoulder: Secondary | ICD-10-CM | POA: Diagnosis not present

## 2015-11-05 DIAGNOSIS — M25511 Pain in right shoulder: Secondary | ICD-10-CM | POA: Diagnosis not present

## 2015-11-07 DIAGNOSIS — M25511 Pain in right shoulder: Secondary | ICD-10-CM | POA: Diagnosis not present

## 2015-11-12 DIAGNOSIS — M25511 Pain in right shoulder: Secondary | ICD-10-CM | POA: Diagnosis not present

## 2015-11-14 DIAGNOSIS — M25511 Pain in right shoulder: Secondary | ICD-10-CM | POA: Diagnosis not present

## 2015-11-15 DIAGNOSIS — H5210 Myopia, unspecified eye: Secondary | ICD-10-CM | POA: Diagnosis not present

## 2015-11-15 DIAGNOSIS — H524 Presbyopia: Secondary | ICD-10-CM | POA: Diagnosis not present

## 2015-11-15 DIAGNOSIS — H43313 Vitreous membranes and strands, bilateral: Secondary | ICD-10-CM | POA: Diagnosis not present

## 2015-11-15 DIAGNOSIS — H2513 Age-related nuclear cataract, bilateral: Secondary | ICD-10-CM | POA: Diagnosis not present

## 2015-11-15 DIAGNOSIS — H5213 Myopia, bilateral: Secondary | ICD-10-CM | POA: Diagnosis not present

## 2015-11-20 DIAGNOSIS — M25511 Pain in right shoulder: Secondary | ICD-10-CM | POA: Diagnosis not present

## 2015-11-22 DIAGNOSIS — M25511 Pain in right shoulder: Secondary | ICD-10-CM | POA: Diagnosis not present

## 2015-11-26 DIAGNOSIS — M25511 Pain in right shoulder: Secondary | ICD-10-CM | POA: Diagnosis not present

## 2015-11-29 DIAGNOSIS — M25511 Pain in right shoulder: Secondary | ICD-10-CM | POA: Diagnosis not present

## 2015-12-04 DIAGNOSIS — M25511 Pain in right shoulder: Secondary | ICD-10-CM | POA: Diagnosis not present

## 2015-12-06 DIAGNOSIS — M25511 Pain in right shoulder: Secondary | ICD-10-CM | POA: Diagnosis not present

## 2015-12-07 DIAGNOSIS — G2581 Restless legs syndrome: Secondary | ICD-10-CM | POA: Diagnosis not present

## 2015-12-11 DIAGNOSIS — M25511 Pain in right shoulder: Secondary | ICD-10-CM | POA: Diagnosis not present

## 2015-12-13 DIAGNOSIS — M25511 Pain in right shoulder: Secondary | ICD-10-CM | POA: Diagnosis not present

## 2015-12-18 DIAGNOSIS — R29898 Other symptoms and signs involving the musculoskeletal system: Secondary | ICD-10-CM | POA: Diagnosis not present

## 2015-12-18 DIAGNOSIS — M25611 Stiffness of right shoulder, not elsewhere classified: Secondary | ICD-10-CM | POA: Diagnosis not present

## 2015-12-18 DIAGNOSIS — M25511 Pain in right shoulder: Secondary | ICD-10-CM | POA: Diagnosis not present

## 2015-12-18 DIAGNOSIS — Z9889 Other specified postprocedural states: Secondary | ICD-10-CM | POA: Diagnosis not present

## 2015-12-20 DIAGNOSIS — M25511 Pain in right shoulder: Secondary | ICD-10-CM | POA: Diagnosis not present

## 2015-12-28 DIAGNOSIS — M25511 Pain in right shoulder: Secondary | ICD-10-CM | POA: Diagnosis not present

## 2015-12-31 DIAGNOSIS — M25511 Pain in right shoulder: Secondary | ICD-10-CM | POA: Diagnosis not present

## 2016-01-04 DIAGNOSIS — M75121 Complete rotator cuff tear or rupture of right shoulder, not specified as traumatic: Secondary | ICD-10-CM | POA: Diagnosis not present

## 2016-01-04 DIAGNOSIS — M7581 Other shoulder lesions, right shoulder: Secondary | ICD-10-CM | POA: Diagnosis not present

## 2016-01-08 DIAGNOSIS — M25511 Pain in right shoulder: Secondary | ICD-10-CM | POA: Diagnosis not present

## 2016-01-09 DIAGNOSIS — L219 Seborrheic dermatitis, unspecified: Secondary | ICD-10-CM | POA: Diagnosis not present

## 2016-01-09 DIAGNOSIS — R202 Paresthesia of skin: Secondary | ICD-10-CM | POA: Diagnosis not present

## 2016-01-09 DIAGNOSIS — L718 Other rosacea: Secondary | ICD-10-CM | POA: Diagnosis not present

## 2016-01-09 DIAGNOSIS — D1800 Hemangioma unspecified site: Secondary | ICD-10-CM | POA: Diagnosis not present

## 2016-01-09 DIAGNOSIS — L578 Other skin changes due to chronic exposure to nonionizing radiation: Secondary | ICD-10-CM | POA: Diagnosis not present

## 2016-01-09 DIAGNOSIS — D179 Benign lipomatous neoplasm, unspecified: Secondary | ICD-10-CM | POA: Diagnosis not present

## 2016-01-09 DIAGNOSIS — Z1283 Encounter for screening for malignant neoplasm of skin: Secondary | ICD-10-CM | POA: Diagnosis not present

## 2016-01-09 DIAGNOSIS — D229 Melanocytic nevi, unspecified: Secondary | ICD-10-CM | POA: Diagnosis not present

## 2016-01-09 DIAGNOSIS — L304 Erythema intertrigo: Secondary | ICD-10-CM | POA: Diagnosis not present

## 2016-01-15 DIAGNOSIS — M25511 Pain in right shoulder: Secondary | ICD-10-CM | POA: Diagnosis not present

## 2016-01-16 DIAGNOSIS — E119 Type 2 diabetes mellitus without complications: Secondary | ICD-10-CM | POA: Diagnosis not present

## 2016-01-16 DIAGNOSIS — E7800 Pure hypercholesterolemia, unspecified: Secondary | ICD-10-CM | POA: Diagnosis not present

## 2016-01-16 DIAGNOSIS — Z79899 Other long term (current) drug therapy: Secondary | ICD-10-CM | POA: Diagnosis not present

## 2016-01-16 DIAGNOSIS — G2581 Restless legs syndrome: Secondary | ICD-10-CM | POA: Diagnosis not present

## 2016-01-22 DIAGNOSIS — Z79899 Other long term (current) drug therapy: Secondary | ICD-10-CM | POA: Diagnosis not present

## 2016-01-22 DIAGNOSIS — Z23 Encounter for immunization: Secondary | ICD-10-CM | POA: Diagnosis not present

## 2016-01-22 DIAGNOSIS — Z125 Encounter for screening for malignant neoplasm of prostate: Secondary | ICD-10-CM | POA: Diagnosis not present

## 2016-01-22 DIAGNOSIS — G894 Chronic pain syndrome: Secondary | ICD-10-CM | POA: Diagnosis not present

## 2016-01-22 DIAGNOSIS — I10 Essential (primary) hypertension: Secondary | ICD-10-CM | POA: Diagnosis not present

## 2016-01-22 DIAGNOSIS — E119 Type 2 diabetes mellitus without complications: Secondary | ICD-10-CM | POA: Diagnosis not present

## 2016-01-22 DIAGNOSIS — E7800 Pure hypercholesterolemia, unspecified: Secondary | ICD-10-CM | POA: Diagnosis not present

## 2016-01-24 DIAGNOSIS — R29898 Other symptoms and signs involving the musculoskeletal system: Secondary | ICD-10-CM | POA: Diagnosis not present

## 2016-01-24 DIAGNOSIS — M25511 Pain in right shoulder: Secondary | ICD-10-CM | POA: Diagnosis not present

## 2016-02-07 DIAGNOSIS — Z9889 Other specified postprocedural states: Secondary | ICD-10-CM | POA: Diagnosis not present

## 2016-02-07 DIAGNOSIS — R29898 Other symptoms and signs involving the musculoskeletal system: Secondary | ICD-10-CM | POA: Diagnosis not present

## 2016-02-07 DIAGNOSIS — M25511 Pain in right shoulder: Secondary | ICD-10-CM | POA: Diagnosis not present

## 2016-02-07 DIAGNOSIS — M25611 Stiffness of right shoulder, not elsewhere classified: Secondary | ICD-10-CM | POA: Diagnosis not present

## 2016-02-21 DIAGNOSIS — H25043 Posterior subcapsular polar age-related cataract, bilateral: Secondary | ICD-10-CM | POA: Diagnosis not present

## 2016-03-10 DIAGNOSIS — Z6832 Body mass index (BMI) 32.0-32.9, adult: Secondary | ICD-10-CM | POA: Diagnosis not present

## 2016-03-10 DIAGNOSIS — G2581 Restless legs syndrome: Secondary | ICD-10-CM | POA: Diagnosis not present

## 2016-03-10 DIAGNOSIS — E6609 Other obesity due to excess calories: Secondary | ICD-10-CM | POA: Diagnosis not present

## 2016-03-26 DIAGNOSIS — L304 Erythema intertrigo: Secondary | ICD-10-CM | POA: Diagnosis not present

## 2016-03-26 DIAGNOSIS — L718 Other rosacea: Secondary | ICD-10-CM | POA: Diagnosis not present

## 2016-03-26 DIAGNOSIS — L219 Seborrheic dermatitis, unspecified: Secondary | ICD-10-CM | POA: Diagnosis not present

## 2016-05-06 DIAGNOSIS — H02839 Dermatochalasis of unspecified eye, unspecified eyelid: Secondary | ICD-10-CM | POA: Diagnosis not present

## 2016-05-06 DIAGNOSIS — H2513 Age-related nuclear cataract, bilateral: Secondary | ICD-10-CM | POA: Diagnosis not present

## 2016-05-06 DIAGNOSIS — H18413 Arcus senilis, bilateral: Secondary | ICD-10-CM | POA: Diagnosis not present

## 2016-05-06 DIAGNOSIS — H2511 Age-related nuclear cataract, right eye: Secondary | ICD-10-CM | POA: Diagnosis not present

## 2016-05-06 DIAGNOSIS — E119 Type 2 diabetes mellitus without complications: Secondary | ICD-10-CM | POA: Diagnosis not present

## 2016-05-14 DIAGNOSIS — E7800 Pure hypercholesterolemia, unspecified: Secondary | ICD-10-CM | POA: Diagnosis not present

## 2016-05-14 DIAGNOSIS — E119 Type 2 diabetes mellitus without complications: Secondary | ICD-10-CM | POA: Diagnosis not present

## 2016-05-14 DIAGNOSIS — Z0000 Encounter for general adult medical examination without abnormal findings: Secondary | ICD-10-CM | POA: Insufficient documentation

## 2016-05-14 DIAGNOSIS — I10 Essential (primary) hypertension: Secondary | ICD-10-CM | POA: Diagnosis not present

## 2016-05-21 DIAGNOSIS — M7711 Lateral epicondylitis, right elbow: Secondary | ICD-10-CM | POA: Diagnosis not present

## 2016-05-21 DIAGNOSIS — M25521 Pain in right elbow: Secondary | ICD-10-CM | POA: Diagnosis not present

## 2016-05-21 DIAGNOSIS — M7521 Bicipital tendinitis, right shoulder: Secondary | ICD-10-CM | POA: Diagnosis not present

## 2016-06-30 DIAGNOSIS — H2511 Age-related nuclear cataract, right eye: Secondary | ICD-10-CM | POA: Diagnosis not present

## 2016-07-01 DIAGNOSIS — H2512 Age-related nuclear cataract, left eye: Secondary | ICD-10-CM | POA: Diagnosis not present

## 2016-07-21 DIAGNOSIS — H2512 Age-related nuclear cataract, left eye: Secondary | ICD-10-CM | POA: Diagnosis not present

## 2016-07-29 ENCOUNTER — Other Ambulatory Visit: Payer: Self-pay | Admitting: Student

## 2016-07-29 DIAGNOSIS — M7711 Lateral epicondylitis, right elbow: Secondary | ICD-10-CM

## 2016-08-06 DIAGNOSIS — E119 Type 2 diabetes mellitus without complications: Secondary | ICD-10-CM | POA: Diagnosis not present

## 2016-08-06 DIAGNOSIS — R5383 Other fatigue: Secondary | ICD-10-CM | POA: Diagnosis not present

## 2016-08-06 DIAGNOSIS — E7800 Pure hypercholesterolemia, unspecified: Secondary | ICD-10-CM | POA: Diagnosis not present

## 2016-08-06 DIAGNOSIS — I10 Essential (primary) hypertension: Secondary | ICD-10-CM | POA: Diagnosis not present

## 2016-08-07 ENCOUNTER — Ambulatory Visit
Admission: RE | Admit: 2016-08-07 | Discharge: 2016-08-07 | Disposition: A | Discharge status: Home / Self Care | Payer: Medicare HMO | Source: Ambulatory Visit | Attending: Student | Admitting: Student

## 2016-08-07 DIAGNOSIS — M7710 Lateral epicondylitis, unspecified elbow: Secondary | ICD-10-CM | POA: Diagnosis not present

## 2016-08-07 DIAGNOSIS — M25521 Pain in right elbow: Secondary | ICD-10-CM | POA: Diagnosis not present

## 2016-08-07 DIAGNOSIS — M7711 Lateral epicondylitis, right elbow: Secondary | ICD-10-CM | POA: Diagnosis not present

## 2016-08-11 DIAGNOSIS — Z0100 Encounter for examination of eyes and vision without abnormal findings: Secondary | ICD-10-CM | POA: Diagnosis not present

## 2016-08-12 DIAGNOSIS — E7800 Pure hypercholesterolemia, unspecified: Secondary | ICD-10-CM | POA: Diagnosis not present

## 2016-08-12 DIAGNOSIS — I10 Essential (primary) hypertension: Secondary | ICD-10-CM | POA: Diagnosis not present

## 2016-08-12 DIAGNOSIS — E119 Type 2 diabetes mellitus without complications: Secondary | ICD-10-CM | POA: Diagnosis not present

## 2016-08-12 DIAGNOSIS — Z0000 Encounter for general adult medical examination without abnormal findings: Secondary | ICD-10-CM | POA: Diagnosis not present

## 2016-08-15 DIAGNOSIS — M7711 Lateral epicondylitis, right elbow: Secondary | ICD-10-CM | POA: Insufficient documentation

## 2016-08-18 DIAGNOSIS — M5416 Radiculopathy, lumbar region: Secondary | ICD-10-CM | POA: Diagnosis not present

## 2016-08-18 DIAGNOSIS — M4726 Other spondylosis with radiculopathy, lumbar region: Secondary | ICD-10-CM | POA: Diagnosis not present

## 2016-08-18 DIAGNOSIS — M5136 Other intervertebral disc degeneration, lumbar region: Secondary | ICD-10-CM | POA: Diagnosis not present

## 2016-08-18 DIAGNOSIS — M48062 Spinal stenosis, lumbar region with neurogenic claudication: Secondary | ICD-10-CM | POA: Diagnosis not present

## 2016-09-04 DIAGNOSIS — H209 Unspecified iridocyclitis: Secondary | ICD-10-CM | POA: Diagnosis not present

## 2016-09-04 DIAGNOSIS — H04123 Dry eye syndrome of bilateral lacrimal glands: Secondary | ICD-10-CM | POA: Diagnosis not present

## 2016-09-10 DIAGNOSIS — H209 Unspecified iridocyclitis: Secondary | ICD-10-CM | POA: Diagnosis not present

## 2016-09-24 ENCOUNTER — Encounter
Admission: RE | Admit: 2016-09-24 | Discharge: 2016-09-24 | Disposition: A | Discharge status: Home / Self Care | Payer: Medicare HMO | Source: Ambulatory Visit | Attending: Surgery | Admitting: Surgery

## 2016-09-24 DIAGNOSIS — I10 Essential (primary) hypertension: Secondary | ICD-10-CM | POA: Insufficient documentation

## 2016-09-24 DIAGNOSIS — M7711 Lateral epicondylitis, right elbow: Secondary | ICD-10-CM | POA: Diagnosis not present

## 2016-09-24 DIAGNOSIS — I4510 Unspecified right bundle-branch block: Secondary | ICD-10-CM | POA: Insufficient documentation

## 2016-09-24 DIAGNOSIS — Z01818 Encounter for other preprocedural examination: Secondary | ICD-10-CM | POA: Insufficient documentation

## 2016-09-24 DIAGNOSIS — M25532 Pain in left wrist: Secondary | ICD-10-CM | POA: Diagnosis not present

## 2016-09-24 DIAGNOSIS — S63502A Unspecified sprain of left wrist, initial encounter: Secondary | ICD-10-CM | POA: Diagnosis not present

## 2016-09-24 HISTORY — DX: Type 2 diabetes mellitus without complications: E11.9

## 2016-09-24 HISTORY — DX: Restless legs syndrome: G25.81

## 2016-09-24 NOTE — Patient Instructions (Signed)
  Your procedure is scheduled on: 10/02/16 Report to Same Day Surgery 2nd floor medical mall [facility] Entrance-take elevator on left to 2nd floor.  Check in with surgery information desk.) To find out your arrival time please call [phone] between 1PM - 3PM on 10/01/16  Remember: Instructions that are not followed completely may result in serious medical risk, up to and including death, or upon the discretion of your surgeon and anesthesiologist your surgery may need to be rescheduled.    _x___ 1. Do not eat food or drink liquids after midnight. No gum chewing or                              hard candies.     __x__ 2. No Alcohol for 24 hours before or after surgery.   __x__3. No Smoking for 24 prior to surgery.   ____  4. Bring all medications with you on the day of surgery if instructed.    __x__ 5. Notify your doctor if there is any change in your medical condition     (cold, fever, infections).     Do not wear jewelry, make-up, hairpins, clips or nail polish.  Do not wear lotions, powders, or perfumes. You may wear deodorant.  Do not shave 48 hours prior to surgery. Men may shave face and neck.  Do not bring valuables to the hospital.    [facility] is not responsible for any belongings or valuables.               Contacts, dentures or bridgework may not be worn into surgery.  Leave your suitcase in the car. After surgery it may be brought to your room.  For patients admitted to the hospital, discharge time is determined by your                       treatment team.   Patients discharged the day of surgery will not be allowed to drive home.  You will need someone to drive you home and stay with you the night of your procedure.    Please read over the following fact sheets that you were given:   [facility] Preparing for Surgery  _x___ Take anti-hypertensive (unless it includes a diuretic), cardiac, seizure, asthma,     anti-reflux and psychiatric medicines. These  include:  1. OMEPRAZOLE  2. CITALOPRAM  3. ATORVASTATIN  4. AMLODIPINE  5. ALPRAZOLAM IF NEEDED  6.  ____Fleets enema or Magnesium Citrate as directed.   _x___ Use CHG Soap or sage wipes as directed on instruction sheet   ____ Use inhalers on the day of surgery and bring to hospital day of surgery  _X___ Stop Metformin and Janumet 2 days prior to surgery.    ____ Take 1/2 of usual insulin dose the night before surgery and none on the morning     surgery.   _x___ Follow recommendations from Cardiologist, Pulmonologist or PCP regarding stopping Aspirin, Coumadin, Pllavix ,Eliquis, Effient, or Pradaxa, and Pletal. STOP ASPIRIN 1 WEEK PREOP X____Stop Anti-inflammatories such as Advil, Aleve, Ibuprofen, Motrin, Naproxen, Naprosyn, Goodies powders or aspirin products. OK to take Tylenol and Celebrex. STOP MELOXICAM NOW _x___ Stop supplements until after surgery.  But may continue Vitamin D, Vitamin B, and multivitamin. STOP FISH OIL NOW  ____ Bring C-Pap to the hospital.

## 2016-09-26 ENCOUNTER — Encounter
Admission: RE | Admit: 2016-09-26 | Discharge: 2016-09-26 | Disposition: A | Discharge status: Home / Self Care | Payer: Medicare HMO | Source: Ambulatory Visit | Attending: Surgery | Admitting: Surgery

## 2016-09-26 DIAGNOSIS — Z01818 Encounter for other preprocedural examination: Secondary | ICD-10-CM | POA: Diagnosis not present

## 2016-09-26 DIAGNOSIS — I10 Essential (primary) hypertension: Secondary | ICD-10-CM | POA: Diagnosis not present

## 2016-09-26 DIAGNOSIS — I4510 Unspecified right bundle-branch block: Secondary | ICD-10-CM | POA: Diagnosis not present

## 2016-09-26 DIAGNOSIS — M7711 Lateral epicondylitis, right elbow: Secondary | ICD-10-CM | POA: Diagnosis not present

## 2016-09-26 LAB — CBC
HCT: 42.4 % (ref 40.0–52.0)
HEMOGLOBIN: 14.7 g/dL (ref 13.0–18.0)
MCH: 30.4 pg (ref 26.0–34.0)
MCHC: 34.6 g/dL (ref 32.0–36.0)
MCV: 87.9 fL (ref 80.0–100.0)
Platelets: 152 10*3/uL (ref 150–440)
RBC: 4.82 MIL/uL (ref 4.40–5.90)
RDW: 13.6 % (ref 11.5–14.5)
WBC: 4.9 10*3/uL (ref 3.8–10.6)

## 2016-09-26 LAB — POTASSIUM: Potassium: 3.9 mmol/L (ref 3.5–5.1)

## 2016-09-26 NOTE — Pre-Procedure Instructions (Signed)
Pt here for lab draw and EKG for upcoming surgery on 10/02/16. EKG little to no change from EKG done on 07/24/2015.

## 2016-10-01 ENCOUNTER — Ambulatory Visit: Admission: RE | Admit: 2016-10-01 | Payer: Managed Care, Other (non HMO) | Source: Ambulatory Visit | Admitting: Surgery

## 2016-10-01 ENCOUNTER — Encounter: Admission: RE | Payer: Self-pay | Source: Ambulatory Visit

## 2016-10-01 SURGERY — TENNIS ELBOW RELEASE/NIRSCHEL PROCEDURE
Anesthesia: Choice | Laterality: Right

## 2016-10-01 MED ORDER — CLINDAMYCIN PHOSPHATE 900 MG/50ML IV SOLN
900.0000 mg | Freq: Once | INTRAVENOUS | Status: AC
  Administered 2016-10-02: 900 mg via INTRAVENOUS

## 2016-10-02 ENCOUNTER — Encounter: Payer: Self-pay | Admitting: *Deleted

## 2016-10-02 ENCOUNTER — Ambulatory Visit: Payer: Medicare HMO | Admitting: Anesthesiology

## 2016-10-02 ENCOUNTER — Ambulatory Visit
Admission: RE | Admit: 2016-10-02 | Discharge: 2016-10-02 | Disposition: A | Discharge status: Home / Self Care | Payer: Medicare HMO | Source: Ambulatory Visit | Attending: Surgery | Admitting: Surgery

## 2016-10-02 ENCOUNTER — Encounter
Admission: RE | Disposition: A | Discharge status: Home / Self Care | Payer: Self-pay | Source: Ambulatory Visit | Attending: Surgery

## 2016-10-02 DIAGNOSIS — K219 Gastro-esophageal reflux disease without esophagitis: Secondary | ICD-10-CM | POA: Insufficient documentation

## 2016-10-02 DIAGNOSIS — Z7982 Long term (current) use of aspirin: Secondary | ICD-10-CM | POA: Insufficient documentation

## 2016-10-02 DIAGNOSIS — Z7984 Long term (current) use of oral hypoglycemic drugs: Secondary | ICD-10-CM | POA: Insufficient documentation

## 2016-10-02 DIAGNOSIS — G4730 Sleep apnea, unspecified: Secondary | ICD-10-CM | POA: Insufficient documentation

## 2016-10-02 DIAGNOSIS — F329 Major depressive disorder, single episode, unspecified: Secondary | ICD-10-CM | POA: Insufficient documentation

## 2016-10-02 DIAGNOSIS — Z79899 Other long term (current) drug therapy: Secondary | ICD-10-CM | POA: Diagnosis not present

## 2016-10-02 DIAGNOSIS — I10 Essential (primary) hypertension: Secondary | ICD-10-CM | POA: Insufficient documentation

## 2016-10-02 DIAGNOSIS — M7711 Lateral epicondylitis, right elbow: Secondary | ICD-10-CM | POA: Diagnosis not present

## 2016-10-02 HISTORY — PX: TENNIS ELBOW RELEASE/NIRSCHEL PROCEDURE: SHX6651

## 2016-10-02 LAB — GLUCOSE, CAPILLARY
GLUCOSE-CAPILLARY: 107 mg/dL — AB (ref 65–99)
Glucose-Capillary: 108 mg/dL — ABNORMAL HIGH (ref 65–99)

## 2016-10-02 SURGERY — TENNIS ELBOW RELEASE/NIRSCHEL PROCEDURE
Anesthesia: General | Laterality: Right

## 2016-10-02 MED ORDER — FENTANYL CITRATE (PF) 100 MCG/2ML IJ SOLN
INTRAMUSCULAR | Status: DC | PRN
  Administered 2016-10-02 (×2): 50 ug via INTRAVENOUS

## 2016-10-02 MED ORDER — CLINDAMYCIN PHOSPHATE 900 MG/50ML IV SOLN
INTRAVENOUS | Status: AC
  Filled 2016-10-02: qty 50

## 2016-10-02 MED ORDER — MIDAZOLAM HCL 2 MG/2ML IJ SOLN
INTRAMUSCULAR | Status: AC
  Filled 2016-10-02: qty 2

## 2016-10-02 MED ORDER — EPHEDRINE SULFATE 50 MG/ML IJ SOLN
INTRAMUSCULAR | Status: DC | PRN
  Administered 2016-10-02: 10 mg via INTRAVENOUS
  Administered 2016-10-02: 5 mg via INTRAVENOUS
  Administered 2016-10-02 (×2): 10 mg via INTRAVENOUS

## 2016-10-02 MED ORDER — ACETAMINOPHEN 10 MG/ML IV SOLN
INTRAVENOUS | Status: DC | PRN
  Administered 2016-10-02: 1000 mg via INTRAVENOUS

## 2016-10-02 MED ORDER — FENTANYL CITRATE (PF) 100 MCG/2ML IJ SOLN
INTRAMUSCULAR | Status: AC
  Filled 2016-10-02: qty 2

## 2016-10-02 MED ORDER — EPHEDRINE SULFATE 50 MG/ML IJ SOLN
INTRAMUSCULAR | Status: AC
  Filled 2016-10-02: qty 1

## 2016-10-02 MED ORDER — NEOMYCIN-POLYMYXIN B GU 40-200000 IR SOLN
Status: AC
  Filled 2016-10-02: qty 2

## 2016-10-02 MED ORDER — FENTANYL CITRATE (PF) 100 MCG/2ML IJ SOLN: 25.0000 ug | INTRAMUSCULAR | Status: DC | PRN

## 2016-10-02 MED ORDER — OXYCODONE HCL 5 MG PO TABS: 5.0000 mg | ORAL_TABLET | ORAL | 0 refills | Status: DC | PRN

## 2016-10-02 MED ORDER — LIDOCAINE HCL (PF) 2 % IJ SOLN
INTRAMUSCULAR | Status: AC
  Filled 2016-10-02: qty 2

## 2016-10-02 MED ORDER — ONDANSETRON HCL 4 MG/2ML IJ SOLN
INTRAMUSCULAR | Status: DC | PRN
  Administered 2016-10-02: 4 mg via INTRAVENOUS

## 2016-10-02 MED ORDER — ONDANSETRON HCL 4 MG/2ML IJ SOLN
INTRAMUSCULAR | Status: AC
  Filled 2016-10-02: qty 2

## 2016-10-02 MED ORDER — BUPIVACAINE HCL (PF) 0.5 % IJ SOLN
INTRAMUSCULAR | Status: DC | PRN
  Administered 2016-10-02: 10 mL

## 2016-10-02 MED ORDER — GLYCOPYRROLATE 0.2 MG/ML IJ SOLN
INTRAMUSCULAR | Status: DC | PRN
  Administered 2016-10-02: 0.2 mg via INTRAVENOUS

## 2016-10-02 MED ORDER — PROPOFOL 10 MG/ML IV BOLUS
INTRAVENOUS | Status: DC | PRN
  Administered 2016-10-02: 200 mg via INTRAVENOUS

## 2016-10-02 MED ORDER — SODIUM CHLORIDE 0.9 % IV SOLN
INTRAVENOUS | Status: DC
  Administered 2016-10-02: 75 mL/h via INTRAVENOUS
  Administered 2016-10-02: 12:00:00 via INTRAVENOUS

## 2016-10-02 MED ORDER — MIDAZOLAM HCL 2 MG/2ML IJ SOLN
INTRAMUSCULAR | Status: DC | PRN
  Administered 2016-10-02: 2 mg via INTRAVENOUS

## 2016-10-02 MED ORDER — ACETAMINOPHEN 10 MG/ML IV SOLN
INTRAVENOUS | Status: AC
  Filled 2016-10-02: qty 100

## 2016-10-02 MED ORDER — ONDANSETRON HCL 4 MG/2ML IJ SOLN
4.0000 mg | Freq: Once | INTRAMUSCULAR | Status: DC | PRN
Start: 2016-10-02 — End: 2016-10-02

## 2016-10-02 MED ORDER — BUPIVACAINE HCL (PF) 0.5 % IJ SOLN
INTRAMUSCULAR | Status: AC
  Filled 2016-10-02: qty 30

## 2016-10-02 MED ORDER — GLYCOPYRROLATE 0.2 MG/ML IJ SOLN
INTRAMUSCULAR | Status: AC
  Filled 2016-10-02: qty 1

## 2016-10-02 MED ORDER — LIDOCAINE HCL (CARDIAC) 20 MG/ML IV SOLN
INTRAVENOUS | Status: DC | PRN
  Administered 2016-10-02: 100 mg via INTRAVENOUS

## 2016-10-02 MED ORDER — NEOMYCIN-POLYMYXIN B GU 40-200000 IR SOLN
Status: DC | PRN
  Administered 2016-10-02: 2 mL

## 2016-10-02 SURGICAL SUPPLY — 54 items
ANCHOR SUT 1.45 SZ 1 SHORT (Anchor) ×6 IMPLANT
BANDAGE ACE 4X5 VEL STRL LF (GAUZE/BANDAGES/DRESSINGS) ×6 IMPLANT
BIT DRILL JUGRKNT W/NDL BIT2.9 (DRILL) IMPLANT
BLADE SURG SZ10 CARB STEEL (BLADE) ×3 IMPLANT
BNDG COHESIVE 4X5 TAN STRL (GAUZE/BANDAGES/DRESSINGS) ×3 IMPLANT
BNDG ESMARK 4X12 TAN STRL LF (GAUZE/BANDAGES/DRESSINGS) ×3 IMPLANT
CANISTER SUCT 1200ML W/VALVE (MISCELLANEOUS) ×3 IMPLANT
CHLORAPREP W/TINT 26ML (MISCELLANEOUS) ×6 IMPLANT
CLOSURE WOUND 1/4X4 (GAUZE/BANDAGES/DRESSINGS) ×1
CORD BIP STRL DISP 12FT (MISCELLANEOUS) ×3 IMPLANT
CUFF TOURN 18 STER (MISCELLANEOUS) ×3 IMPLANT
CUFF TOURN 24 STER (MISCELLANEOUS) IMPLANT
DRAPE SURG 17X11 SM STRL (DRAPES) ×3 IMPLANT
DRILL JUGGERKNOT W/NDL BIT 2.9 (DRILL)
ELECT REM PT RETURN 9FT ADLT (ELECTROSURGICAL) ×3
ELECTRODE REM PT RTRN 9FT ADLT (ELECTROSURGICAL) ×1 IMPLANT
FORCEPS JEWEL BIP 4-3/4 STR (INSTRUMENTS) ×3 IMPLANT
GAUZE SPONGE 4X4 12PLY STRL (GAUZE/BANDAGES/DRESSINGS) ×3 IMPLANT
GAUZE XEROFORM 4X4 STRL (GAUZE/BANDAGES/DRESSINGS) ×3 IMPLANT
GLOVE BIO SURGEON STRL SZ8 (GLOVE) ×9 IMPLANT
GLOVE INDICATOR 8.0 STRL GRN (GLOVE) ×9 IMPLANT
GOWN STRL REUS W/ TWL LRG LVL3 (GOWN DISPOSABLE) ×1 IMPLANT
GOWN STRL REUS W/ TWL XL LVL3 (GOWN DISPOSABLE) ×2 IMPLANT
GOWN STRL REUS W/TWL LRG LVL3 (GOWN DISPOSABLE) ×2
GOWN STRL REUS W/TWL XL LVL3 (GOWN DISPOSABLE) ×4
KIT RM TURNOVER STRD PROC AR (KITS) ×3 IMPLANT
LABEL OR SOLS (LABEL) ×3 IMPLANT
LOOP RED MAXI  1X406MM (MISCELLANEOUS) ×2
LOOP VESSEL MAXI 1X406 RED (MISCELLANEOUS) ×1 IMPLANT
NDL SAFETY 18GX1.5 (NEEDLE) ×3 IMPLANT
NEEDLE FILTER BLUNT 18X 1/2SAF (NEEDLE) ×4
NEEDLE FILTER BLUNT 18X1 1/2 (NEEDLE) ×2 IMPLANT
NS IRRIG 1000ML POUR BTL (IV SOLUTION) IMPLANT
PACK EXTREMITY ARMC (MISCELLANEOUS) ×3 IMPLANT
PAD CAST CTTN 4X4 STRL (SOFTGOODS) IMPLANT
PADDING CAST COTTON 4X4 STRL (SOFTGOODS)
SLING ARM LRG DEEP (SOFTGOODS) ×3 IMPLANT
SPLINT WRIST LG RT TX900304 (SOFTGOODS) ×3 IMPLANT
SPONGE LAP 18X18 5 PK (GAUZE/BANDAGES/DRESSINGS) ×3 IMPLANT
STAPLER SKIN PROX 35W (STAPLE) IMPLANT
STOCKINETTE IMPERVIOUS 9X36 MD (GAUZE/BANDAGES/DRESSINGS) ×3 IMPLANT
STRAP SAFETY BODY (MISCELLANEOUS) ×6 IMPLANT
STRIP CLOSURE SKIN 1/4X4 (GAUZE/BANDAGES/DRESSINGS) ×2 IMPLANT
SUT PROLENE 4 0 PS 2 18 (SUTURE) IMPLANT
SUT VIC AB 0 CT2 27 (SUTURE) IMPLANT
SUT VIC AB 2-0 CT1 (SUTURE) ×3 IMPLANT
SUT VIC AB 3-0 SH 27 (SUTURE) ×2
SUT VIC AB 3-0 SH 27X BRD (SUTURE) ×1 IMPLANT
SUT VIC AB 4-0 SH 27 (SUTURE)
SUT VIC AB 4-0 SH 27XANBCTRL (SUTURE) IMPLANT
SUT VICRYL+ 3-0 36IN CT-1 (SUTURE) IMPLANT
SWABSTK COMLB BENZOIN TINCTURE (MISCELLANEOUS) ×3 IMPLANT
SYR 5ML LL (SYRINGE) ×3 IMPLANT
SYRINGE 10CC LL (SYRINGE) ×3 IMPLANT

## 2016-10-02 NOTE — Anesthesia Postprocedure Evaluation (Signed)
Anesthesia Post Note  Patient: Amias Hutchinson  Procedure(s) Performed: Procedure(s) (LRB): TENNIS ELBOW / OPEN DEBRIDMENT OF THE COMMON EXTENSOR ORGIN OF RIGHT ELBOW (Right)  Patient location during evaluation: PACU Anesthesia Type: General Level of consciousness: awake and alert Pain management: pain level controlled Vital Signs Assessment: post-procedure vital signs reviewed and stable Respiratory status: spontaneous breathing, nonlabored ventilation, respiratory function stable and patient connected to nasal cannula oxygen Cardiovascular status: blood pressure returned to baseline and stable Postop Assessment: no signs of nausea or vomiting Anesthetic complications: no     Last Vitals:  Vitals:   10/02/16 1303 10/02/16 1337  BP: 134/77 128/68  Pulse: 71 73  Resp: 16 16  Temp: 36.1 C     Last Pain:  Vitals:   10/02/16 1337  TempSrc:   PainSc: 2                  Martha Clan

## 2016-10-02 NOTE — Anesthesia Post-op Follow-up Note (Cosign Needed)
Anesthesia QCDR form completed.        

## 2016-10-02 NOTE — Op Note (Signed)
10/02/2016  12:15 PM  Patient:   George Daniels  Pre-Op Diagnosis:   Chronic lateral epicondylitis, right elbow.  Post-Op Diagnosis:   Same.  Procedure:   Debridement/repair of common extensor origin, right elbow.  Surgeon:   Pascal Lux, MD  Assistant:   None  Anesthesia:   General LMA  Findings:   As above.  Complications:   None  EBL:   <5 cc  Fluids:   800 cc crystalloid  TT:   33 minutes at 250 mmHg  Drains:   None  Closure:   3-0 Vicryl subcuticular sutures  Implants:   Biomet JuggerKnot 1.4 mm anchor x2  Brief Clinical Note:   The patient is a 71 year old male with a long history of intermittent lateral right elbow pain. His symptoms have persisted despite medications, injections, etc. His history and examination consistent with chronic lateral epicondylitis. The patient presents at this time for debridement and repair of the common extensor origin of his right elbow.  Procedure:   The patient was brought into the operating room and lain in the supine position. After adequate general laryngal mask anesthesia was obtained, the patient's right upper extremity was prepped with ChloraPrep solution before being draped sterilely. Preoperative antibiotics were administered. A timeout was performed to verify the appropriate surgical site before the limb was exsanguinated with an Esmarch and the tourniquet inflated to 250 mmHg. An approximately 4-5 cm incision was made over the lateral aspect of the elbow beginning at the lateral epicondyle and extending distally in line with the common extensor origin tendons. The incision was carried down through the subcutaneous tissues to expose the common extensor origin. The extensor carpi radialis brevis tendon was identified and incised in line with the incision. The areas of significantly degenerative tendinous tissues were debrided sharply with a #15 blade down to the epicondylar region. The bone itself was roughened with a rongeur to  provide a good bleeding surface for reattachment of the tendon. Two Biomet 1.4 mm JuggerKnot anchors were inserted into the exposed bone of the lateral epicondyle. The sutures were passed through the tendon and tied securely to effect the repair. The wound was copiously irrigated with bacitracin saline solution using bulb irrigation before several 2-0 Vicryl interrupted sutures were used to close the longitudinal incision in the tendon in a side-to-side fashion. The subcutaneous tissues were closed using 2-0 Vicryl interrupted sutures before the skin was closed using 3-0 Vicryl subcuticular sutures. Benzoin and Steri-Strips were applied to the skin. A total of 10 cc of 0.5% plain Sensorcaine was injected in and around the incision to help with postoperative analgesia before a sterile bulky dressing was applied to the elbow. A Velcro wrist immobilizer was applied before the patient was awakened, extubated, and returned to the recovery room in satisfactory condition after tolerating the procedure well.

## 2016-10-02 NOTE — Discharge Instructions (Addendum)
Keep dressing dry and intact. Keep hand elevated above heart level. May shower after dressing removed on postop day 4 (Monday). Cover staples/sutures with Band-Aids after drying off, then reapply velcro wrist splint. Apply ice to affected area frequently. Take ibuprofen 800 mg TID with meals for 7-10 days, then as necessary. Take pain medication as prescribed when needed.  Return for follow-up in 10-14 days or as scheduled.   AMBULATORY SURGERY  DISCHARGE INSTRUCTIONS   1) The drugs that you were given will stay in your system until tomorrow so for the next 24 hours you should not:  A) Drive an automobile B) Make any legal decisions C) Drink any alcoholic beverage   2) You may resume regular meals tomorrow.  Today it is better to start with liquids and gradually work up to solid foods.  You may eat anything you prefer, but it is better to start with liquids, then soup and crackers, and gradually work up to solid foods.   3) Please notify your doctor immediately if you have any unusual bleeding, trouble breathing, redness and pain at the surgery site, drainage, fever, or pain not relieved by medication.   4) Additional Instructions:

## 2016-10-02 NOTE — Transfer of Care (Signed)
Immediate Anesthesia Transfer of Care Note  Patient: George Daniels  Procedure(s) Performed: Procedure(s): TENNIS ELBOW / OPEN DEBRIDMENT OF THE COMMON EXTENSOR ORGIN OF RIGHT ELBOW (Right)  Patient Location: PACU  Anesthesia Type:General  Level of Consciousness: awake  Airway & Oxygen Therapy: Patient Spontanous Breathing and Patient connected to face mask oxygen  Post-op Assessment: Report given to RN and Post -op Vital signs reviewed and stable  Post vital signs: Reviewed and stable  Last Vitals:  Vitals:   10/02/16 0949  BP: 125/70  Pulse: 63  Resp: 16  Temp: (!) 35.9 C    Last Pain:  Vitals:   10/02/16 0949  TempSrc: Oral  PainSc: 3       Patients Stated Pain Goal: 0 (05/05/22 1146)  Complications: No apparent anesthesia complications

## 2016-10-02 NOTE — Anesthesia Preprocedure Evaluation (Signed)
Anesthesia Evaluation  Patient identified by MRN, date of birth, ID band Patient awake    Reviewed: Allergy & Precautions, H&P , NPO status , Patient's Chart, lab work & pertinent test results  History of Anesthesia Complications Negative for: history of anesthetic complications  Airway Mallampati: III  TM Distance: >3 FB Neck ROM: full    Dental  (+) Poor Dentition   Pulmonary neg shortness of breath, sleep apnea and Continuous Positive Airway Pressure Ventilation , neg recent URI,    Pulmonary exam normal breath sounds clear to auscultation       Cardiovascular Exercise Tolerance: Good hypertension, (-) angina(-) Past MI and (-) DOE negative cardio ROS Normal cardiovascular exam Rhythm:regular Rate:Normal     Neuro/Psych PSYCHIATRIC DISORDERS negative neurological ROS     GI/Hepatic Neg liver ROS, GERD  Controlled,  Endo/Other  negative endocrine ROSdiabetes  Renal/GU negative Renal ROS  negative genitourinary   Musculoskeletal  (+) Arthritis ,   Abdominal   Peds  Hematology negative hematology ROS (+)   Anesthesia Other Findings Past Medical History:   Family history of adverse reaction to anesthes*                Comment:daughter nausea   Hypertension                                                 Depression                                                   GERD (gastroesophageal reflux disease)                       Arthritis                                                      Comment:back   Sleep apnea                                                 Past Surgical History:   TONSILLECTOMY                                                 ACHILLES TENDON REPAIR                          Right 2014           Reproductive/Obstetrics negative OB ROS                             Anesthesia Physical  Anesthesia Plan  ASA: III  Anesthesia Plan: General   Post-op Pain  Management: GA combined w/ Regional for post-op pain   Induction: Intravenous  PONV Risk Score and Plan: 2 and Ondansetron and Dexamethasone  Airway Management Planned: LMA  Additional Equipment:   Intra-op Plan:   Post-operative Plan: Extubation in OR  Informed Consent: I have reviewed the patients History and Physical, chart, labs and discussed the procedure including the risks, benefits and alternatives for the proposed anesthesia with the patient or authorized representative who has indicated his/her understanding and acceptance.   Dental Advisory Given  Plan Discussed with: Anesthesiologist, CRNA and Surgeon  Anesthesia Plan Comments:         Anesthesia Quick Evaluation

## 2016-10-02 NOTE — Anesthesia Procedure Notes (Signed)
Procedure Name: LMA Insertion Date/Time: 10/02/2016 11:01 AM Performed by: Aline Brochure Pre-anesthesia Checklist: Patient identified, Emergency Drugs available, Suction available and Patient being monitored Patient Re-evaluated:Patient Re-evaluated prior to inductionOxygen Delivery Method: Circle system utilized Preoxygenation: Pre-oxygenation with 100% oxygen Intubation Type: IV induction Ventilation: Mask ventilation without difficulty LMA: LMA inserted LMA Size: 4.5 Number of attempts: 1 Placement Confirmation: positive ETCO2 and breath sounds checked- equal and bilateral Tube secured with: Tape Dental Injury: Teeth and Oropharynx as per pre-operative assessment

## 2016-10-02 NOTE — H&P (Signed)
Paper H&P to be scanned into permanent record. H&P reviewed and patient re-examined. No changes. 

## 2016-11-04 DIAGNOSIS — G2581 Restless legs syndrome: Secondary | ICD-10-CM | POA: Diagnosis not present

## 2016-11-10 DIAGNOSIS — S63502A Unspecified sprain of left wrist, initial encounter: Secondary | ICD-10-CM | POA: Insufficient documentation

## 2016-11-18 DIAGNOSIS — M6281 Muscle weakness (generalized): Secondary | ICD-10-CM | POA: Diagnosis not present

## 2016-11-19 ENCOUNTER — Ambulatory Visit: Payer: Medicare HMO | Admitting: Occupational Therapy

## 2016-11-20 DIAGNOSIS — M6281 Muscle weakness (generalized): Secondary | ICD-10-CM | POA: Diagnosis not present

## 2016-11-25 DIAGNOSIS — M6281 Muscle weakness (generalized): Secondary | ICD-10-CM | POA: Diagnosis not present

## 2016-11-27 DIAGNOSIS — M6281 Muscle weakness (generalized): Secondary | ICD-10-CM | POA: Diagnosis not present

## 2016-12-02 DIAGNOSIS — M6281 Muscle weakness (generalized): Secondary | ICD-10-CM | POA: Diagnosis not present

## 2016-12-04 DIAGNOSIS — M6281 Muscle weakness (generalized): Secondary | ICD-10-CM | POA: Diagnosis not present

## 2016-12-09 DIAGNOSIS — I10 Essential (primary) hypertension: Secondary | ICD-10-CM | POA: Diagnosis not present

## 2016-12-09 DIAGNOSIS — M6281 Muscle weakness (generalized): Secondary | ICD-10-CM | POA: Diagnosis not present

## 2016-12-09 DIAGNOSIS — E119 Type 2 diabetes mellitus without complications: Secondary | ICD-10-CM | POA: Diagnosis not present

## 2016-12-09 DIAGNOSIS — E7800 Pure hypercholesterolemia, unspecified: Secondary | ICD-10-CM | POA: Diagnosis not present

## 2016-12-09 DIAGNOSIS — Z0000 Encounter for general adult medical examination without abnormal findings: Secondary | ICD-10-CM | POA: Diagnosis not present

## 2016-12-10 DIAGNOSIS — E7800 Pure hypercholesterolemia, unspecified: Secondary | ICD-10-CM | POA: Diagnosis not present

## 2016-12-10 DIAGNOSIS — E119 Type 2 diabetes mellitus without complications: Secondary | ICD-10-CM | POA: Diagnosis not present

## 2016-12-10 DIAGNOSIS — S63502D Unspecified sprain of left wrist, subsequent encounter: Secondary | ICD-10-CM | POA: Diagnosis not present

## 2016-12-10 DIAGNOSIS — Z0000 Encounter for general adult medical examination without abnormal findings: Secondary | ICD-10-CM | POA: Diagnosis not present

## 2016-12-10 DIAGNOSIS — I10 Essential (primary) hypertension: Secondary | ICD-10-CM | POA: Diagnosis not present

## 2016-12-11 DIAGNOSIS — M6281 Muscle weakness (generalized): Secondary | ICD-10-CM | POA: Diagnosis not present

## 2016-12-16 DIAGNOSIS — E119 Type 2 diabetes mellitus without complications: Secondary | ICD-10-CM | POA: Diagnosis not present

## 2016-12-16 DIAGNOSIS — I10 Essential (primary) hypertension: Secondary | ICD-10-CM | POA: Diagnosis not present

## 2016-12-16 DIAGNOSIS — E7800 Pure hypercholesterolemia, unspecified: Secondary | ICD-10-CM | POA: Diagnosis not present

## 2016-12-16 DIAGNOSIS — F325 Major depressive disorder, single episode, in full remission: Secondary | ICD-10-CM | POA: Diagnosis not present

## 2017-01-12 DIAGNOSIS — M48062 Spinal stenosis, lumbar region with neurogenic claudication: Secondary | ICD-10-CM | POA: Diagnosis not present

## 2017-01-12 DIAGNOSIS — M4726 Other spondylosis with radiculopathy, lumbar region: Secondary | ICD-10-CM | POA: Diagnosis not present

## 2017-01-12 DIAGNOSIS — M5136 Other intervertebral disc degeneration, lumbar region: Secondary | ICD-10-CM | POA: Diagnosis not present

## 2017-01-12 DIAGNOSIS — M5416 Radiculopathy, lumbar region: Secondary | ICD-10-CM | POA: Diagnosis not present

## 2017-01-19 DIAGNOSIS — L578 Other skin changes due to chronic exposure to nonionizing radiation: Secondary | ICD-10-CM | POA: Diagnosis not present

## 2017-01-19 DIAGNOSIS — L304 Erythema intertrigo: Secondary | ICD-10-CM | POA: Diagnosis not present

## 2017-01-19 DIAGNOSIS — D179 Benign lipomatous neoplasm, unspecified: Secondary | ICD-10-CM | POA: Diagnosis not present

## 2017-01-19 DIAGNOSIS — D1801 Hemangioma of skin and subcutaneous tissue: Secondary | ICD-10-CM | POA: Diagnosis not present

## 2017-01-19 DIAGNOSIS — L812 Freckles: Secondary | ICD-10-CM | POA: Diagnosis not present

## 2017-01-19 DIAGNOSIS — L821 Other seborrheic keratosis: Secondary | ICD-10-CM | POA: Diagnosis not present

## 2017-01-19 DIAGNOSIS — Z1283 Encounter for screening for malignant neoplasm of skin: Secondary | ICD-10-CM | POA: Diagnosis not present

## 2017-01-19 DIAGNOSIS — D225 Melanocytic nevi of trunk: Secondary | ICD-10-CM | POA: Diagnosis not present

## 2017-01-27 DIAGNOSIS — H5213 Myopia, bilateral: Secondary | ICD-10-CM | POA: Diagnosis not present

## 2017-02-10 DIAGNOSIS — M1612 Unilateral primary osteoarthritis, left hip: Secondary | ICD-10-CM | POA: Diagnosis not present

## 2017-03-17 DIAGNOSIS — D1722 Benign lipomatous neoplasm of skin and subcutaneous tissue of left arm: Secondary | ICD-10-CM | POA: Diagnosis not present

## 2017-03-17 DIAGNOSIS — D485 Neoplasm of uncertain behavior of skin: Secondary | ICD-10-CM | POA: Diagnosis not present

## 2017-03-17 DIAGNOSIS — R234 Changes in skin texture: Secondary | ICD-10-CM | POA: Diagnosis not present

## 2017-03-17 DIAGNOSIS — L720 Epidermal cyst: Secondary | ICD-10-CM | POA: Diagnosis not present

## 2017-03-24 DIAGNOSIS — D485 Neoplasm of uncertain behavior of skin: Secondary | ICD-10-CM | POA: Diagnosis not present

## 2017-03-24 DIAGNOSIS — D1721 Benign lipomatous neoplasm of skin and subcutaneous tissue of right arm: Secondary | ICD-10-CM | POA: Diagnosis not present

## 2017-03-24 DIAGNOSIS — D1722 Benign lipomatous neoplasm of skin and subcutaneous tissue of left arm: Secondary | ICD-10-CM | POA: Diagnosis not present

## 2017-03-26 ENCOUNTER — Other Ambulatory Visit: Payer: Self-pay | Admitting: Physical Medicine and Rehabilitation

## 2017-03-26 DIAGNOSIS — M5416 Radiculopathy, lumbar region: Secondary | ICD-10-CM

## 2017-03-31 DIAGNOSIS — L720 Epidermal cyst: Secondary | ICD-10-CM | POA: Diagnosis not present

## 2017-03-31 DIAGNOSIS — L719 Rosacea, unspecified: Secondary | ICD-10-CM | POA: Diagnosis not present

## 2017-03-31 DIAGNOSIS — D1721 Benign lipomatous neoplasm of skin and subcutaneous tissue of right arm: Secondary | ICD-10-CM | POA: Diagnosis not present

## 2017-04-01 ENCOUNTER — Ambulatory Visit
Admission: RE | Admit: 2017-04-01 | Discharge: 2017-04-01 | Disposition: A | Discharge status: Home / Self Care | Payer: Medicare HMO | Source: Ambulatory Visit | Attending: Physical Medicine and Rehabilitation | Admitting: Physical Medicine and Rehabilitation

## 2017-04-01 DIAGNOSIS — M5416 Radiculopathy, lumbar region: Secondary | ICD-10-CM | POA: Insufficient documentation

## 2017-04-01 DIAGNOSIS — M533 Sacrococcygeal disorders, not elsewhere classified: Secondary | ICD-10-CM | POA: Diagnosis not present

## 2017-04-01 DIAGNOSIS — M48061 Spinal stenosis, lumbar region without neurogenic claudication: Secondary | ICD-10-CM | POA: Diagnosis not present

## 2017-04-01 DIAGNOSIS — M545 Low back pain: Secondary | ICD-10-CM | POA: Diagnosis not present

## 2017-04-03 ENCOUNTER — Inpatient Hospital Stay: Payer: Medicare HMO | Attending: Oncology | Admitting: Oncology

## 2017-04-03 ENCOUNTER — Encounter: Payer: Self-pay | Admitting: Oncology

## 2017-04-03 ENCOUNTER — Inpatient Hospital Stay: Payer: Medicare HMO

## 2017-04-03 VITALS — BP 131/73 | HR 90 | Temp 98.5°F | Resp 18 | Ht 72.0 in | Wt 228.0 lb

## 2017-04-03 DIAGNOSIS — G4730 Sleep apnea, unspecified: Secondary | ICD-10-CM | POA: Insufficient documentation

## 2017-04-03 DIAGNOSIS — N281 Cyst of kidney, acquired: Secondary | ICD-10-CM | POA: Diagnosis not present

## 2017-04-03 DIAGNOSIS — Z79899 Other long term (current) drug therapy: Secondary | ICD-10-CM | POA: Insufficient documentation

## 2017-04-03 DIAGNOSIS — M48061 Spinal stenosis, lumbar region without neurogenic claudication: Secondary | ICD-10-CM | POA: Diagnosis not present

## 2017-04-03 DIAGNOSIS — R590 Localized enlarged lymph nodes: Secondary | ICD-10-CM | POA: Diagnosis not present

## 2017-04-03 DIAGNOSIS — C7951 Secondary malignant neoplasm of bone: Secondary | ICD-10-CM | POA: Insufficient documentation

## 2017-04-03 DIAGNOSIS — K219 Gastro-esophageal reflux disease without esophagitis: Secondary | ICD-10-CM | POA: Diagnosis not present

## 2017-04-03 DIAGNOSIS — M5126 Other intervertebral disc displacement, lumbar region: Secondary | ICD-10-CM | POA: Insufficient documentation

## 2017-04-03 DIAGNOSIS — C801 Malignant (primary) neoplasm, unspecified: Secondary | ICD-10-CM | POA: Diagnosis not present

## 2017-04-03 DIAGNOSIS — E119 Type 2 diabetes mellitus without complications: Secondary | ICD-10-CM | POA: Insufficient documentation

## 2017-04-03 DIAGNOSIS — F329 Major depressive disorder, single episode, unspecified: Secondary | ICD-10-CM | POA: Diagnosis not present

## 2017-04-03 DIAGNOSIS — G2581 Restless legs syndrome: Secondary | ICD-10-CM | POA: Insufficient documentation

## 2017-04-03 DIAGNOSIS — M1990 Unspecified osteoarthritis, unspecified site: Secondary | ICD-10-CM | POA: Insufficient documentation

## 2017-04-03 DIAGNOSIS — R918 Other nonspecific abnormal finding of lung field: Secondary | ICD-10-CM | POA: Diagnosis not present

## 2017-04-03 DIAGNOSIS — I10 Essential (primary) hypertension: Secondary | ICD-10-CM | POA: Insufficient documentation

## 2017-04-03 LAB — COMPREHENSIVE METABOLIC PANEL
ALK PHOS: 97 U/L (ref 38–126)
ALT: 26 U/L (ref 17–63)
AST: 24 U/L (ref 15–41)
Albumin: 4.2 g/dL (ref 3.5–5.0)
Anion gap: 9 (ref 5–15)
BUN: 14 mg/dL (ref 6–20)
CALCIUM: 9.4 mg/dL (ref 8.9–10.3)
CO2: 30 mmol/L (ref 22–32)
CREATININE: 0.96 mg/dL (ref 0.61–1.24)
Chloride: 95 mmol/L — ABNORMAL LOW (ref 101–111)
Glucose, Bld: 114 mg/dL — ABNORMAL HIGH (ref 65–99)
Potassium: 3.8 mmol/L (ref 3.5–5.1)
Sodium: 134 mmol/L — ABNORMAL LOW (ref 135–145)
Total Bilirubin: 1.1 mg/dL (ref 0.3–1.2)
Total Protein: 7.4 g/dL (ref 6.5–8.1)

## 2017-04-03 LAB — CBC WITH DIFFERENTIAL/PLATELET
BASOS PCT: 1 %
Basophils Absolute: 0.1 10*3/uL (ref 0–0.1)
EOS ABS: 0.2 10*3/uL (ref 0–0.7)
EOS PCT: 3 %
HCT: 43.1 % (ref 40.0–52.0)
HEMOGLOBIN: 14.9 g/dL (ref 13.0–18.0)
LYMPHS ABS: 1 10*3/uL (ref 1.0–3.6)
Lymphocytes Relative: 16 %
MCH: 31.2 pg (ref 26.0–34.0)
MCHC: 34.5 g/dL (ref 32.0–36.0)
MCV: 90.3 fL (ref 80.0–100.0)
MONOS PCT: 7 %
Monocytes Absolute: 0.4 10*3/uL (ref 0.2–1.0)
NEUTROS PCT: 73 %
Neutro Abs: 4.4 10*3/uL (ref 1.4–6.5)
PLATELETS: 183 10*3/uL (ref 150–440)
RBC: 4.78 MIL/uL (ref 4.40–5.90)
RDW: 13.6 % (ref 11.5–14.5)
WBC: 6.1 10*3/uL (ref 3.8–10.6)

## 2017-04-03 LAB — LACTATE DEHYDROGENASE: LDH: 130 U/L (ref 98–192)

## 2017-04-03 LAB — PSA: Prostatic Specific Antigen: 0.7 ng/mL (ref 0.00–4.00)

## 2017-04-03 NOTE — Progress Notes (Signed)
Hematology/Oncology Consult note [facility] [phone] [phone]  Patient Care Team: Kirk Ruths, MD as PCP - General (Internal Medicine)   Name of the patient: George Daniels  [id number]  02-08-1946    Reason for referral- bone lesions   Referring physician- Dr. Ouida Sills  Date of visit: 04/03/17   History of presenting illness-patient is a 71 year old male with a past medical history significant for chronic back pain secondary to arthritis, hypertension among other medical problems.  He reports that his back pain has been gradually getting worse over the last few months which prompted MRI of the lumbar spine without contrast.  MRI on 04/01/2017 was compared to the one that he had in 2013Which showed multiple new lesions in his bones some of which were present in 2013.  Market progression of numerous bone lesions consistent with metastatic disease.  New severe spinal stenosis at L4-L5 due to a combination of hypertrophy of the ligamentum flavum and facet joints and a broad-based disc bulge.  Chronic progressive severe bilateral facet arthritis at L5-S1 without focal neural impingement patient states that back in  January 2013 patient had some symptoms of back pain which had prompted an MRI of the spine.  This was followed by a bone scan and a repeat MRI in September 2013 and he was not aware of any suspicious bony lesions that could have been suggestive of cancer.  He is also been getting his PSA checked and reports that his last PSA done less than 3 months ago was normal.  He is a lifetime non-smoker and is also up-to-date with his colonoscopy which was 3 years ago and was normal.  He has also seen urology in the past for symptoms of urinary frequency and hesitancy and had workup done at that time which was reportedly unremarkable  Other than chronic back pain which is been acutely worse over the last couple of months patient reports  feeling well otherwise.  His appetite is good and he denies any unintentional weight denies any blood in his urine or stool  ECOG PS- 0  Pain scale- 4   Review of systems- Review of Systems  Constitutional: Positive for malaise/fatigue. Negative for chills, fever and weight loss.  HENT: Negative for congestion, ear discharge and nosebleeds.   Eyes: Negative for blurred vision.  Respiratory: Negative for cough, hemoptysis, sputum production, shortness of breath and wheezing.   Cardiovascular: Negative for chest pain, palpitations, orthopnea and claudication.  Gastrointestinal: Negative for abdominal pain, blood in stool, constipation, diarrhea, heartburn, melena, nausea and vomiting.  Genitourinary: Negative for dysuria, flank pain, frequency, hematuria and urgency.  Musculoskeletal: Positive for back pain. Negative for joint pain and myalgias.  Skin: Negative for rash.  Neurological: Negative for dizziness, tingling, focal weakness, seizures, weakness and headaches.  Endo/Heme/Allergies: Does not bruise/bleed easily.  Psychiatric/Behavioral: Negative for depression and suicidal ideas. The patient does not have insomnia.     Allergies  Allergen Reactions  . Penicillins Rash    Has patient had a PCN reaction causing immediate rash, facial/tongue/throat swelling, SOB or lightheadedness with hypotension: No Has patient had a PCN reaction causing severe rash involving mucus membranes or skin necrosis: No Has patient had a PCN reaction that required hospitalization: No Has patient had a PCN reaction occurring within the last 10 years: No If all of the above answers are "NO", then may proceed with Cephalosporin use.     There are no active problems to display for this patient.  Past Medical History:  Diagnosis Date  . Arthritis    back  . Depression   . Diabetes mellitus without complication ([place])   . Family history of adverse reaction to anesthesia    daughter nausea  . GERD  (gastroesophageal reflux disease)   . Hypertension   . RLS (restless legs syndrome)   . Sleep apnea    does not use cpap     Past Surgical History:  Procedure Laterality Date  . ACHILLES TENDON REPAIR Right 2014  . SHOULDER ARTHROSCOPY WITH OPEN ROTATOR CUFF REPAIR Right 08/02/2015   Procedure: SHOULDER ARTHROSCOPY WITH LIMITED DEBRIDEMENT,  MINI OPEN ROTATOR CUFF REPAIR, DECOMPRESSION;  Surgeon: Corky Mull, MD;  Location: [facility] ORS;  Service: Orthopedics;  Laterality: Right;  . TENNIS ELBOW RELEASE/NIRSCHEL PROCEDURE Right 10/02/2016   Procedure: TENNIS ELBOW / OPEN DEBRIDMENT OF THE COMMON EXTENSOR ORGIN OF RIGHT ELBOW;  Surgeon: Corky Mull, MD;  Location: [facility] ORS;  Service: Orthopedics;  Laterality: Right;  . TONSILLECTOMY      Social History   Socioeconomic History  . Marital status: Married    Spouse name: Not on file  . Number of children: Not on file  . Years of education: Not on file  . Highest education level: Not on file  Social Needs  . Financial resource strain: Not on file  . Food insecurity - worry: Not on file  . Food insecurity - inability: Not on file  . Transportation needs - medical: Not on file  . Transportation needs - non-medical: Not on file  Occupational History  . Not on file  Tobacco Use  . Smoking status: Never Smoker  . Smokeless tobacco: Never Used  Substance and Sexual Activity  . Alcohol use: Yes    Alcohol/week: 0.0 - 0.6 oz    Comment: 1 bloody Mary every two weeks  . Drug use: No  . Sexual activity: Not on file  Other Topics Concern  . Not on file  Social History Narrative  . Not on file     History reviewed. No pertinent family history.   Current Outpatient Medications:  .  ALPRAZolam (XANAX) 0.5 MG tablet, Take 0.5 mg by mouth daily as needed for anxiety. , Disp: , Rfl:  .  amLODipine (NORVASC) 5 MG tablet, Take 5 mg by mouth daily. , Disp: , Rfl:  .  aspirin 81 MG tablet, Take 81 mg by mouth every morning., Disp: , Rfl:    .  atorvastatin (LIPITOR) 10 MG tablet, Take 10 mg by mouth every morning., Disp: , Rfl:  .  cetirizine (ZYRTEC) 10 MG tablet, Take 10 mg by mouth at bedtime., Disp: , Rfl:  .  citalopram (CELEXA) 40 MG tablet, Take 40 mg by mouth every morning., Disp: , Rfl:  .  clotrimazole (LOTRIMIN) 1 % cream, Apply 1 application topically every other day., Disp: , Rfl:  .  gabapentin (NEURONTIN) 300 MG capsule, Take 300 mg by mouth at bedtime., Disp: , Rfl:  .  hydrocortisone cream 1 %, Apply 1 application topically every other day., Disp: , Rfl:  .  lisinopril-hydrochlorothiazide (PRINZIDE,ZESTORETIC) 20-25 MG tablet, Take 1 tablet by mouth every morning., Disp: , Rfl:  .  metFORMIN (GLUCOPHAGE-XR) 500 MG 24 hr tablet, Take 500 mg by mouth daily with supper. , Disp: , Rfl:  .  Multiple Vitamin (MULTIVITAMIN WITH MINERALS) TABS tablet, Take 1 tablet by mouth daily., Disp: , Rfl:  .  naproxen sodium (ALEVE) 220 MG tablet, Take 220 mg  by mouth., Disp: , Rfl:  .  Omega-3 Fatty Acids (FISH OIL) 1000 MG CAPS, Take 1,000 mg by mouth daily., Disp: , Rfl:  .  omeprazole (PRILOSEC) 40 MG capsule, Take 40 mg by mouth every evening., Disp: , Rfl:  .  rOPINIRole (REQUIP) 1 MG tablet, Take 1 mg by mouth at bedtime. , Disp: , Rfl:  .  tiZANidine (ZANAFLEX) 2 MG tablet, Take 2 mg by mouth every 6 (six) hours as needed for muscle spasms., Disp: , Rfl:  .  traZODone (DESYREL) 100 MG tablet, Take 100 mg by mouth at bedtime., Disp: , Rfl:    Physical exam:  Vitals:   04/03/17 1154 04/03/17 1156  BP:  131/73  Pulse:  90  Resp: 18   Temp:  98.5 F (36.9 C)  TempSrc:  Tympanic  Weight: 228 lb (103.4 kg)   Height: 6' (1.829 m)    Physical Exam  Constitutional: He is oriented to person, place, and time and well-developed, well-nourished, and in no distress.  HENT:  Head: Normocephalic and atraumatic.  Eyes: EOM are normal. Pupils are equal, round, and reactive to light.  Neck: Normal range of motion.   Cardiovascular: Normal rate, regular rhythm and normal heart sounds.  Pulmonary/Chest: Effort normal and breath sounds normal.  Abdominal: Soft. Bowel sounds are normal.  No palpable splenomegaly  Lymphadenopathy:  No palpable cervical, supraclavicular, axillary or inguinal adenopathy  Neurological: He is alert and oriented to person, place, and time.  Strength 5 x 5 in bilateral lower extremities  Skin: Skin is warm and dry.       CMP Latest Ref Rng & Units 09/26/2016  Potassium 3.5 - 5.1 mmol/L 3.9   CBC Latest Ref Rng & Units 09/26/2016  WBC 3.8 - 10.6 K/uL 4.9  Hemoglobin 13.0 - 18.0 g/dL 14.7  Hematocrit 40.0 - 52.0 % 42.4  Platelets 150 - 440 K/uL 152    No images are attached to the encounter.  Mr Lumbar Spine Wo Contrast  Result Date: 04/02/2017 CLINICAL DATA:  Chronic low back pain and left leg pain. EXAM: MRI LUMBAR SPINE WITHOUT CONTRAST TECHNIQUE: Multiplanar, multisequence MR imaging of the lumbar spine was performed. No intravenous contrast was administered. COMPARISON:  Radiographs dated 05/30/2014 and MRI dated 05/21/2011 FINDINGS: Segmentation:  Standard. Alignment: Minimal retrolisthesis of L1 on L2 and of L2 on L3. Minimal anterolisthesis of L4 on L5. Vertebrae: There has been marked progression in the size of all of the lesions demonstrated on the prior exam of 05/21/2011. In addition, there are new lesions in T12, L4, L5, and S3. The previously demonstrated lesions in L1, L2, L3, L4, and S1 have all enlarged. There is no pathologic fracture or cortical disruption. No extension of the lesions into the soft tissues or spinal canal. Conus medullaris and cauda equina: Conus extends to the L1 level. Conus and cauda equina appear normal. Paraspinal and other soft tissues: Bilateral benign-appearing renal cysts. Otherwise negative. Disc levels: T12-L1:  Normal disc.  No neural impingement. L1-2: New disc desiccation and disc space narrowing with a small broad-based disc  bulge with accompanying osteophytes without neural impingement. Marked progression of bone lesions in the vertebral bodies extending into the posterior elements of L1 and L2. L2-3: New disc desiccation and disc space narrowing with a small broad-based disc bulge with accompanying osteophytes with a small soft disc protrusion into the left neural foramen. The diffuse disc bulge creates moderate compression of the thecal sac without focal neural impingement. Slight hypertrophy  of the ligamentum flavum and facet joints. Progressive bone lesions in the L3 vertebra extending into the left side of the posterior elements. L3-4: New disc desiccation. New small broad-based disc bulge without neural impingement. Small protrusion into the lateral aspect of the left neural foramen which posteriorly deviates the left L3 nerve lateral to the neural foramen, best seen on image 23 of series 5. Progression of bone lesions in L4. L4-5: New disc desiccation with a small broad-based disc bulge. Progressive now severe bilateral facet arthritis with increased ligamentum flavum hypertrophy combining with the disc bulge to create severe spinal stenosis and moderately severe bilateral foraminal stenosis. New small lesion in the superior aspect of the L5 vertebral body. L5-S1: New slight disc bulge central and to the left. Moderately severe bilateral facet arthritis without focal neural impingement. Marked progression of bone lesion in the S1 segment. Sacrum: New lesions in S2. Lesion and S3. Possible small lesion in the right ilium seen on image 38 of series 5. IMPRESSION: 1. Marked progression of numerous bone lesions consistent with metastatic disease. Multiple new lesions as described. Given the almost 6 year time interval between the 2 MRI scans, I suspect that the most likely diagnosis is prostate cancer. 2. New severe spinal stenosis at L4-5 due to a combination of hypertrophy of the ligamentum flavum and facet joints and a  broad-based disc bulge. 3. Chronic progressive severe bilateral facet arthritis at L5-S1 without focal neural impingement. Electronically Signed   By: Lorriane Shire M.D.   On: 04/02/2017 10:39    Assessment and plan- Patient is a 71 y.o. male referred for findings of suspicious bony metastases on recent MRI of the lumbar spine  I have reviewed the MRI images again with radiology today and the etiology of these bone lesions as to whether there definitively malignant or not is currently unclear. There is also a possibility of multiple myeloma causing bone lesions given that these lesions have been present back in 2013 as well but appear worse at this time.  MRI was reported to be suspicious for prostate cancer.  However patient's recent PSA done within 3 months was reportedly normal per patient.  I did see a PSA from March 2017 which was normal at 0.6 as well  At this point I would like to proceed with a CT chest abdomen and pelvis with contrast as well as a bone scan.  If there are any other suspicious primary sites noted on the CT scan, I would like to biopsy those before attempting a bone biopsy.  I will also get a CBC, CMP, LDH, myeloma panel, serum free light chains, 24-hour urine protein and urine protein electrophoresis as well as PSA and serum testosterone today  I will tentatively see the patient back in 2 weeks time after these tests are done for further management possibly after the biopsy  Currently patient is not in severe pain and there is no sign of cord compression noted on the MRI as well as on clinical exam.  I will therefore hold off on radiation oncology referral  Thank you for this kind referral and the opportunity to participate in the care of this patient   Visit Diagnosis 1. Bone metastases ([place])     Dr. Randa Evens, MD, MPH [facility] Pager- [phone] 04/03/2017  1:10 PM

## 2017-04-04 LAB — TESTOSTERONE: Testosterone: 237 ng/dL — ABNORMAL LOW (ref 264–916)

## 2017-04-06 LAB — MULTIPLE MYELOMA PANEL, SERUM
ALBUMIN/GLOB SERPL: 1.6 (ref 0.7–1.7)
ALPHA2 GLOB SERPL ELPH-MCNC: 0.7 g/dL (ref 0.4–1.0)
Albumin SerPl Elph-Mcnc: 4.2 g/dL (ref 2.9–4.4)
Alpha 1: 0.3 g/dL (ref 0.0–0.4)
B-Globulin SerPl Elph-Mcnc: 1 g/dL (ref 0.7–1.3)
GAMMA GLOB SERPL ELPH-MCNC: 0.9 g/dL (ref 0.4–1.8)
GLOBULIN, TOTAL: 2.8 g/dL (ref 2.2–3.9)
IGG (IMMUNOGLOBIN G), SERUM: 942 mg/dL (ref 700–1600)
IgA: 79 mg/dL (ref 61–437)
IgM (Immunoglobulin M), Srm: 40 mg/dL (ref 20–172)
Total Protein ELP: 7 g/dL (ref 6.0–8.5)

## 2017-04-06 LAB — KAPPA/LAMBDA LIGHT CHAINS
KAPPA, LAMDA LIGHT CHAIN RATIO: 1.74 — AB (ref 0.26–1.65)
Kappa free light chain: 14.8 mg/L (ref 3.3–19.4)
Lambda free light chains: 8.5 mg/L (ref 5.7–26.3)

## 2017-04-08 DIAGNOSIS — R918 Other nonspecific abnormal finding of lung field: Secondary | ICD-10-CM | POA: Diagnosis not present

## 2017-04-08 DIAGNOSIS — R590 Localized enlarged lymph nodes: Secondary | ICD-10-CM | POA: Diagnosis not present

## 2017-04-08 DIAGNOSIS — E119 Type 2 diabetes mellitus without complications: Secondary | ICD-10-CM | POA: Diagnosis not present

## 2017-04-08 DIAGNOSIS — C7951 Secondary malignant neoplasm of bone: Secondary | ICD-10-CM | POA: Diagnosis not present

## 2017-04-08 DIAGNOSIS — K219 Gastro-esophageal reflux disease without esophagitis: Secondary | ICD-10-CM | POA: Diagnosis not present

## 2017-04-08 DIAGNOSIS — C801 Malignant (primary) neoplasm, unspecified: Secondary | ICD-10-CM | POA: Diagnosis not present

## 2017-04-08 DIAGNOSIS — M48061 Spinal stenosis, lumbar region without neurogenic claudication: Secondary | ICD-10-CM | POA: Diagnosis not present

## 2017-04-08 DIAGNOSIS — I10 Essential (primary) hypertension: Secondary | ICD-10-CM | POA: Diagnosis not present

## 2017-04-08 DIAGNOSIS — M5126 Other intervertebral disc displacement, lumbar region: Secondary | ICD-10-CM | POA: Diagnosis not present

## 2017-04-09 ENCOUNTER — Other Ambulatory Visit: Payer: Self-pay

## 2017-04-09 DIAGNOSIS — C7951 Secondary malignant neoplasm of bone: Secondary | ICD-10-CM

## 2017-04-09 LAB — PROTEIN, URINE, 24 HOUR
Collection Interval-UPROT: 24 hours
Protein, Urine: <6 mg/dL
URINE TOTAL VOLUME-UPROT: 1400 mL

## 2017-04-13 ENCOUNTER — Ambulatory Visit
Admission: RE | Admit: 2017-04-13 | Discharge: 2017-04-13 | Disposition: A | Discharge status: Home / Self Care | Payer: Medicare HMO | Source: Ambulatory Visit | Attending: Oncology | Admitting: Oncology

## 2017-04-13 ENCOUNTER — Inpatient Hospital Stay (HOSPITAL_BASED_OUTPATIENT_CLINIC_OR_DEPARTMENT_OTHER): Payer: Medicare HMO | Admitting: Oncology

## 2017-04-13 ENCOUNTER — Encounter: Payer: Self-pay | Admitting: Oncology

## 2017-04-13 ENCOUNTER — Other Ambulatory Visit: Payer: Self-pay | Admitting: Oncology

## 2017-04-13 ENCOUNTER — Encounter
Admission: RE | Admit: 2017-04-13 | Discharge: 2017-04-13 | Disposition: A | Discharge status: Home / Self Care | Payer: Medicare HMO | Source: Ambulatory Visit | Attending: Oncology | Admitting: Oncology

## 2017-04-13 VITALS — BP 143/76 | HR 84 | Temp 97.5°F | Resp 18

## 2017-04-13 DIAGNOSIS — C801 Malignant (primary) neoplasm, unspecified: Secondary | ICD-10-CM

## 2017-04-13 DIAGNOSIS — R9389 Abnormal findings on diagnostic imaging of other specified body structures: Secondary | ICD-10-CM | POA: Insufficient documentation

## 2017-04-13 DIAGNOSIS — E119 Type 2 diabetes mellitus without complications: Secondary | ICD-10-CM | POA: Diagnosis not present

## 2017-04-13 DIAGNOSIS — C7951 Secondary malignant neoplasm of bone: Secondary | ICD-10-CM | POA: Diagnosis not present

## 2017-04-13 DIAGNOSIS — R918 Other nonspecific abnormal finding of lung field: Secondary | ICD-10-CM | POA: Insufficient documentation

## 2017-04-13 DIAGNOSIS — K219 Gastro-esophageal reflux disease without esophagitis: Secondary | ICD-10-CM | POA: Diagnosis not present

## 2017-04-13 DIAGNOSIS — I10 Essential (primary) hypertension: Secondary | ICD-10-CM | POA: Diagnosis not present

## 2017-04-13 DIAGNOSIS — R590 Localized enlarged lymph nodes: Secondary | ICD-10-CM | POA: Diagnosis not present

## 2017-04-13 DIAGNOSIS — K7689 Other specified diseases of liver: Secondary | ICD-10-CM | POA: Insufficient documentation

## 2017-04-13 DIAGNOSIS — Z7189 Other specified counseling: Secondary | ICD-10-CM

## 2017-04-13 DIAGNOSIS — M48061 Spinal stenosis, lumbar region without neurogenic claudication: Secondary | ICD-10-CM | POA: Diagnosis not present

## 2017-04-13 DIAGNOSIS — M5126 Other intervertebral disc displacement, lumbar region: Secondary | ICD-10-CM | POA: Diagnosis not present

## 2017-04-13 MED ORDER — TECHNETIUM TC 99M MEDRONATE IV KIT
25.0000 | PACK | Freq: Once | INTRAVENOUS | Status: AC | PRN
  Administered 2017-04-13: 22.7 via INTRAVENOUS

## 2017-04-13 MED ORDER — IOPAMIDOL (ISOVUE-300) INJECTION 61%
100.0000 mL | Freq: Once | INTRAVENOUS | Status: AC | PRN
  Administered 2017-04-13: 100 mL via INTRAVENOUS

## 2017-04-13 NOTE — Progress Notes (Signed)
Hematology/Oncology Consult note [facility]  Telephone:([phone] [phone]  Patient Care Team: Kirk Ruths, MD as PCP - General (Internal Medicine)   Name of the patient: George Daniels  [id number]  [birth year]   Date of visit: 04/13/17  Diagnosis- lung mass and bone mets  Chief complaint/ Reason for visit- discuss CT scan results and further management  Heme/Onc history: patient is a 71 year old male with a past medical history significant for chronic back pain secondary to arthritis, hypertension among other medical problems.  He reports that his back pain has been gradually getting worse over the last few months which prompted MRI of the lumbar spine without contrast.  MRI on 04/01/2017 was compared to the one that he had in 2013Which showed multiple new lesions in his bones some of which were present in 2013.  Market progression of numerous bone lesions consistent with metastatic disease.  New severe spinal stenosis at L4-L5 due to a combination of hypertrophy of the ligamentum flavum and facet joints and a broad-based disc bulge.  Chronic progressive severe bilateral facet arthritis at L5-S1 without focal neural impingement patient states that back in  January 2013 patient had some symptoms of back pain which had prompted an MRI of the spine.  This was followed by a bone scan and a repeat MRI in September 2013 and he was not aware of any suspicious bony lesions that could have been suggestive of cancer.  He is also been getting his PSA checked and reports that his last PSA done less than 3 months ago was normal.  He is a lifetime non-smoker and is also up-to-date with his colonoscopy which was 3 years ago and was normal.  He has also seen urology in the past for symptoms of urinary frequency and hesitancy and had workup done at that time which was reportedly unremarkable  Other than chronic back pain which is been acutely worse over the  last couple of months patient reports feeling well otherwise.  His appetite is good and he denies any unintentional weight denies any blood in his urine or stool  Blood work from 04/03/2017 showed normal PSA.  CBC and CMP were within normal limits.  LDH was normal.  Multiple myeloma panel revealed no monoclonal protein.  Serum free light chain was mildly elevated at 1.74.  Patient had a CT chest abdomen and pelvis with contrast which showed a large irregular mass in the right lower lobe measuring 6.4 x 3.2 cm in size.  Multiple bilateral irregular pulmonary nodules.  Mediastinal adenopathy ranging from 1-1.3 cm.  Multiple lytic lesions throughout the thoracic spine.  Low-attenuation lesion in the liver in the right hepatic lobe measuring 1.9 x 1.7 cm and an additional lesion measuring 1.1 cm.  Interval history- continues to have chronic back pain but patient states it is tolerable at this time. Denies any new complaints  ECOG PS- 0 Pain scale- 0 Opioid associated constipation- no  Review of systems- Review of Systems  Constitutional: Negative for chills, fever, malaise/fatigue and weight loss.  HENT: Negative for congestion, ear discharge and nosebleeds.   Eyes: Negative for blurred vision.  Respiratory: Negative for cough, hemoptysis, sputum production, shortness of breath and wheezing.   Cardiovascular: Negative for chest pain, palpitations, orthopnea and claudication.  Gastrointestinal: Negative for abdominal pain, blood in stool, constipation, diarrhea, heartburn, melena, nausea and vomiting.  Genitourinary: Negative for dysuria, flank pain, frequency, hematuria and urgency.  Musculoskeletal: Positive for back pain. Negative for  joint pain and myalgias.  Skin: Negative for rash.  Neurological: Negative for dizziness, tingling, focal weakness, seizures, weakness and headaches.  Endo/Heme/Allergies: Does not bruise/bleed easily.  Psychiatric/Behavioral: Negative for depression and suicidal  ideas. The patient does not have insomnia.       Allergies  Allergen Reactions  . Penicillins Rash    Has patient had a PCN reaction causing immediate rash, facial/tongue/throat swelling, SOB or lightheadedness with hypotension: No Has patient had a PCN reaction causing severe rash involving mucus membranes or skin necrosis: No Has patient had a PCN reaction that required hospitalization: No Has patient had a PCN reaction occurring within the last 10 years: No If all of the above answers are "NO", then may proceed with Cephalosporin use.      Past Medical History:  Diagnosis Date  . Arthritis    back  . Depression   . Diabetes mellitus without complication ([place])   . Family history of adverse reaction to anesthesia    daughter nausea  . GERD (gastroesophageal reflux disease)   . Hypertension   . RLS (restless legs syndrome)   . Sleep apnea    does not use cpap     Past Surgical History:  Procedure Laterality Date  . ACHILLES TENDON REPAIR Right 2014  . SHOULDER ARTHROSCOPY WITH OPEN ROTATOR CUFF REPAIR Right 08/02/2015   Procedure: SHOULDER ARTHROSCOPY WITH LIMITED DEBRIDEMENT,  MINI OPEN ROTATOR CUFF REPAIR, DECOMPRESSION;  Surgeon: Corky Mull, MD;  Location: [facility] ORS;  Service: Orthopedics;  Laterality: Right;  . TENNIS ELBOW RELEASE/NIRSCHEL PROCEDURE Right 10/02/2016   Procedure: TENNIS ELBOW / OPEN DEBRIDMENT OF THE COMMON EXTENSOR ORGIN OF RIGHT ELBOW;  Surgeon: Corky Mull, MD;  Location: [facility] ORS;  Service: Orthopedics;  Laterality: Right;  . TONSILLECTOMY      Social History   Socioeconomic History  . Marital status: Married    Spouse name: Not on file  . Number of children: Not on file  . Years of education: Not on file  . Highest education level: Not on file  Social Needs  . Financial resource strain: Not on file  . Food insecurity - worry: Not on file  . Food insecurity - inability: Not on file  . Transportation needs - medical: Not on file  .  Transportation needs - non-medical: Not on file  Occupational History  . Not on file  Tobacco Use  . Smoking status: Never Smoker  . Smokeless tobacco: Never Used  Substance and Sexual Activity  . Alcohol use: Yes    Alcohol/week: 0.0 - 0.6 oz    Comment: 1 bloody Mary every two weeks  . Drug use: No  . Sexual activity: Not on file  Other Topics Concern  . Not on file  Social History Narrative  . Not on file    No family history on file.   Current Outpatient Medications:  .  ALPRAZolam (XANAX) 0.5 MG tablet, Take 0.5 mg by mouth daily as needed for anxiety. , Disp: , Rfl:  .  amLODipine (NORVASC) 5 MG tablet, Take 5 mg by mouth daily. , Disp: , Rfl:  .  aspirin 81 MG tablet, Take 81 mg by mouth every morning., Disp: , Rfl:  .  atorvastatin (LIPITOR) 10 MG tablet, Take 10 mg by mouth every morning., Disp: , Rfl:  .  cetirizine (ZYRTEC) 10 MG tablet, Take 10 mg by mouth at bedtime., Disp: , Rfl:  .  citalopram (CELEXA) 40 MG tablet, Take 40 mg by  mouth every morning., Disp: , Rfl:  .  clotrimazole (LOTRIMIN) 1 % cream, Apply 1 application topically every other day., Disp: , Rfl:  .  gabapentin (NEURONTIN) 300 MG capsule, Take 300 mg by mouth at bedtime., Disp: , Rfl:  .  hydrocortisone cream 1 %, Apply 1 application topically every other day., Disp: , Rfl:  .  lisinopril-hydrochlorothiazide (PRINZIDE,ZESTORETIC) 20-25 MG tablet, Take 1 tablet by mouth every morning., Disp: , Rfl:  .  metFORMIN (GLUCOPHAGE-XR) 500 MG 24 hr tablet, Take 500 mg by mouth daily with supper. , Disp: , Rfl:  .  Multiple Vitamin (MULTIVITAMIN WITH MINERALS) TABS tablet, Take 1 tablet by mouth daily., Disp: , Rfl:  .  naproxen sodium (ALEVE) 220 MG tablet, Take 220 mg by mouth., Disp: , Rfl:  .  Omega-3 Fatty Acids (FISH OIL) 1000 MG CAPS, Take 1,000 mg by mouth daily., Disp: , Rfl:  .  omeprazole (PRILOSEC) 40 MG capsule, Take 40 mg by mouth every evening., Disp: , Rfl:  .  rOPINIRole (REQUIP) 1 MG tablet,  Take 1 mg by mouth at bedtime. , Disp: , Rfl:  .  tiZANidine (ZANAFLEX) 2 MG tablet, Take 2 mg by mouth every 6 (six) hours as needed for muscle spasms., Disp: , Rfl:  .  traZODone (DESYREL) 100 MG tablet, Take 100 mg by mouth at bedtime., Disp: , Rfl:   Physical exam:  Vitals:   04/13/17 1518  BP: (!) 143/76  Pulse: 84  Resp: 18  Temp: (!) 97.5 F (36.4 C)  TempSrc: Tympanic   Physical Exam  Constitutional: He is oriented to person, place, and time and well-developed, well-nourished, and in no distress.  HENT:  Head: Normocephalic and atraumatic.  Eyes: EOM are normal. Pupils are equal, round, and reactive to light.  Neck: Normal range of motion.  Cardiovascular: Normal rate, regular rhythm and normal heart sounds.  Pulmonary/Chest: Effort normal and breath sounds normal.  Abdominal: Soft. Bowel sounds are normal.  Neurological: He is alert and oriented to person, place, and time.  Skin: Skin is warm and dry.     CMP Latest Ref Rng & Units 04/03/2017  Glucose 65 - 99 mg/dL 114(H)  BUN 6 - 20 mg/dL 14  Creatinine 0.61 - 1.24 mg/dL 0.96  Sodium 135 - 145 mmol/L 134(L)  Potassium 3.5 - 5.1 mmol/L 3.8  Chloride 101 - 111 mmol/L 95(L)  CO2 22 - 32 mmol/L 30  Calcium 8.9 - 10.3 mg/dL 9.4  Total Protein 6.5 - 8.1 g/dL 7.4  Total Bilirubin 0.3 - 1.2 mg/dL 1.1  Alkaline Phos 38 - 126 U/L 97  AST 15 - 41 U/L 24  ALT 17 - 63 U/L 26   CBC Latest Ref Rng & Units 04/03/2017  WBC 3.8 - 10.6 K/uL 6.1  Hemoglobin 13.0 - 18.0 g/dL 14.9  Hematocrit 40.0 - 52.0 % 43.1  Platelets 150 - 440 K/uL 183    No images are attached to the encounter.  Ct Chest W Contrast  Result Date: 04/13/2017 CLINICAL DATA:  Patient with suspicious osseous lesions on prior MRI concerning for the possibility of metastatic prostate cancer. EXAM: CT CHEST, ABDOMEN, AND PELVIS WITH CONTRAST TECHNIQUE: Multidetector CT imaging of the chest, abdomen and pelvis was performed following the standard protocol  during bolus administration of intravenous contrast. CONTRAST:  155m ISOVUE-300 IOPAMIDOL (ISOVUE-300) INJECTION 61% COMPARISON:  MR lumbar spine 04/01/2017 FINDINGS: CT CHEST FINDINGS Cardiovascular: Normal heart size. No pericardial effusion. Thoracic aortic vascular calcifications. Aorta and main  pulmonary artery are normal in caliber. Mediastinum/Nodes: Multiple prominent and mildly enlarged mediastinal lymph nodes including a 1.0 cm right peritracheal lymph node, a 1.2 cm prevascular lymph node (image 20; series 2) and a 1.3 cm subcarinal lymph node (image 26; series 2). Additional prominent subcentimeter lymph nodes are demonstrated. Esophagus is normal in appearance. Lungs/Pleura: Central airways are patent. Within the right lower lobe there is a 6.4 x 3.2 cm irregular mass (image 109; series 4). There are multiple bilateral irregular pulmonary nodules demonstrated throughout the lungs bilaterally with a reference 1.3 cm nodule in the left upper lobe (image 36; series 4) and a 1.5 cm nodule within the right middle lobe (image 74; series 4). No pleural effusion or pneumothorax. Musculoskeletal: Multiple lytic lesions throughout the visualized thoracic spine including the T3, T4 and T7 vertebral bodies. CT ABDOMEN PELVIS FINDINGS Hepatobiliary: There is a 1.9 x 1.7 cm indeterminate low-attenuation lesion within the right hepatic lobe (image 54; series 2). There is an additional 1.1 cm low-attenuation lesion within the hepatic dome (image 49; series 2). Additional subcentimeter low-attenuation lesions are demonstrated within the liver. Gallbladder is unremarkable. No intrahepatic or extrahepatic biliary ductal dilatation. Pancreas: Unremarkable Spleen:  Unremarkable Adrenals/Urinary Tract: The adrenal glands are normal. Kidneys enhance symmetrically with contrast. There is a 4.7 cm exophytic cyst off the interpolar region of the left kidney. There is a 4.0 cm cyst within the interpolar region of the right  kidney and a 1.8 cm cyst within the inferior pole of the right kidney. No suspicious enhancing renal masses identified. No hydronephrosis. Urinary bladder is unremarkable. Stomach/Bowel: Normal morphology of the stomach. No abnormal bowel wall thickening or evidence for bowel obstruction. No free fluid or free intraperitoneal air. Vascular/Lymphatic: Normal caliber abdominal aorta. No retroperitoneal lymphadenopathy. Peripheral calcified atherosclerotic plaque. Reproductive: Prostate is heterogeneous. Other: Small fat containing left inguinal hernia. Musculoskeletal: Multiple lytic lesions are demonstrated throughout the visualized lumbar spine and pelvis including a 2.4 cm lesion within the left ilium (image 108; series 2) and a 2.4 cm lesion within the L4 vertebral body. IMPRESSION: 1. Large irregular mass within the right lower lobe which may represent primary pulmonary malignancy or metastatic disease. 2. Multiple small irregular pulmonary nodules throughout the lungs bilaterally concerning for metastatic disease. 3. Prominent and mildly enlarged mediastinal lymph nodes concerning for metastatic disease. 4. Indeterminate hepatic lesions raising the possibility of metastatic disease. Depending upon pathologic diagnosis and clinical treatment, consider further evaluation with MRI. 5. Multiple lytic lesions throughout the skeleton compatible with osseous metastatic disease. Electronically Signed   By: Lovey Newcomer M.D.   On: 04/13/2017 13:22   Mr Lumbar Spine Wo Contrast  Result Date: 04/02/2017 CLINICAL DATA:  Chronic low back pain and left leg pain. EXAM: MRI LUMBAR SPINE WITHOUT CONTRAST TECHNIQUE: Multiplanar, multisequence MR imaging of the lumbar spine was performed. No intravenous contrast was administered. COMPARISON:  Radiographs dated 05/30/2014 and MRI dated 05/21/2011 FINDINGS: Segmentation:  Standard. Alignment: Minimal retrolisthesis of L1 on L2 and of L2 on L3. Minimal anterolisthesis of L4 on  L5. Vertebrae: There has been marked progression in the size of all of the lesions demonstrated on the prior exam of 05/21/2011. In addition, there are new lesions in T12, L4, L5, and S3. The previously demonstrated lesions in L1, L2, L3, L4, and S1 have all enlarged. There is no pathologic fracture or cortical disruption. No extension of the lesions into the soft tissues or spinal canal. Conus medullaris and cauda equina: Conus extends to the  L1 level. Conus and cauda equina appear normal. Paraspinal and other soft tissues: Bilateral benign-appearing renal cysts. Otherwise negative. Disc levels: T12-L1:  Normal disc.  No neural impingement. L1-2: New disc desiccation and disc space narrowing with a small broad-based disc bulge with accompanying osteophytes without neural impingement. Marked progression of bone lesions in the vertebral bodies extending into the posterior elements of L1 and L2. L2-3: New disc desiccation and disc space narrowing with a small broad-based disc bulge with accompanying osteophytes with a small soft disc protrusion into the left neural foramen. The diffuse disc bulge creates moderate compression of the thecal sac without focal neural impingement. Slight hypertrophy of the ligamentum flavum and facet joints. Progressive bone lesions in the L3 vertebra extending into the left side of the posterior elements. L3-4: New disc desiccation. New small broad-based disc bulge without neural impingement. Small protrusion into the lateral aspect of the left neural foramen which posteriorly deviates the left L3 nerve lateral to the neural foramen, best seen on image 23 of series 5. Progression of bone lesions in L4. L4-5: New disc desiccation with a small broad-based disc bulge. Progressive now severe bilateral facet arthritis with increased ligamentum flavum hypertrophy combining with the disc bulge to create severe spinal stenosis and moderately severe bilateral foraminal stenosis. New small lesion  in the superior aspect of the L5 vertebral body. L5-S1: New slight disc bulge central and to the left. Moderately severe bilateral facet arthritis without focal neural impingement. Marked progression of bone lesion in the S1 segment. Sacrum: New lesions in S2. Lesion and S3. Possible small lesion in the right ilium seen on image 38 of series 5. IMPRESSION: 1. Marked progression of numerous bone lesions consistent with metastatic disease. Multiple new lesions as described. Given the almost 6 year time interval between the 2 MRI scans, I suspect that the most likely diagnosis is prostate cancer. 2. New severe spinal stenosis at L4-5 due to a combination of hypertrophy of the ligamentum flavum and facet joints and a broad-based disc bulge. 3. Chronic progressive severe bilateral facet arthritis at L5-S1 without focal neural impingement. Electronically Signed   By: Lorriane Shire M.D.   On: 04/02/2017 10:39   Ct Abdomen Pelvis W Contrast  Result Date: 04/13/2017 CLINICAL DATA:  Patient with suspicious osseous lesions on prior MRI concerning for the possibility of metastatic prostate cancer. EXAM: CT CHEST, ABDOMEN, AND PELVIS WITH CONTRAST TECHNIQUE: Multidetector CT imaging of the chest, abdomen and pelvis was performed following the standard protocol during bolus administration of intravenous contrast. CONTRAST:  162m ISOVUE-300 IOPAMIDOL (ISOVUE-300) INJECTION 61% COMPARISON:  MR lumbar spine 04/01/2017 FINDINGS: CT CHEST FINDINGS Cardiovascular: Normal heart size. No pericardial effusion. Thoracic aortic vascular calcifications. Aorta and main pulmonary artery are normal in caliber. Mediastinum/Nodes: Multiple prominent and mildly enlarged mediastinal lymph nodes including a 1.0 cm right peritracheal lymph node, a 1.2 cm prevascular lymph node (image 20; series 2) and a 1.3 cm subcarinal lymph node (image 26; series 2). Additional prominent subcentimeter lymph nodes are demonstrated. Esophagus is normal in  appearance. Lungs/Pleura: Central airways are patent. Within the right lower lobe there is a 6.4 x 3.2 cm irregular mass (image 109; series 4). There are multiple bilateral irregular pulmonary nodules demonstrated throughout the lungs bilaterally with a reference 1.3 cm nodule in the left upper lobe (image 36; series 4) and a 1.5 cm nodule within the right middle lobe (image 74; series 4). No pleural effusion or pneumothorax. Musculoskeletal: Multiple lytic lesions throughout the visualized  thoracic spine including the T3, T4 and T7 vertebral bodies. CT ABDOMEN PELVIS FINDINGS Hepatobiliary: There is a 1.9 x 1.7 cm indeterminate low-attenuation lesion within the right hepatic lobe (image 54; series 2). There is an additional 1.1 cm low-attenuation lesion within the hepatic dome (image 49; series 2). Additional subcentimeter low-attenuation lesions are demonstrated within the liver. Gallbladder is unremarkable. No intrahepatic or extrahepatic biliary ductal dilatation. Pancreas: Unremarkable Spleen:  Unremarkable Adrenals/Urinary Tract: The adrenal glands are normal. Kidneys enhance symmetrically with contrast. There is a 4.7 cm exophytic cyst off the interpolar region of the left kidney. There is a 4.0 cm cyst within the interpolar region of the right kidney and a 1.8 cm cyst within the inferior pole of the right kidney. No suspicious enhancing renal masses identified. No hydronephrosis. Urinary bladder is unremarkable. Stomach/Bowel: Normal morphology of the stomach. No abnormal bowel wall thickening or evidence for bowel obstruction. No free fluid or free intraperitoneal air. Vascular/Lymphatic: Normal caliber abdominal aorta. No retroperitoneal lymphadenopathy. Peripheral calcified atherosclerotic plaque. Reproductive: Prostate is heterogeneous. Other: Small fat containing left inguinal hernia. Musculoskeletal: Multiple lytic lesions are demonstrated throughout the visualized lumbar spine and pelvis including a  2.4 cm lesion within the left ilium (image 108; series 2) and a 2.4 cm lesion within the L4 vertebral body. IMPRESSION: 1. Large irregular mass within the right lower lobe which may represent primary pulmonary malignancy or metastatic disease. 2. Multiple small irregular pulmonary nodules throughout the lungs bilaterally concerning for metastatic disease. 3. Prominent and mildly enlarged mediastinal lymph nodes concerning for metastatic disease. 4. Indeterminate hepatic lesions raising the possibility of metastatic disease. Depending upon pathologic diagnosis and clinical treatment, consider further evaluation with MRI. 5. Multiple lytic lesions throughout the skeleton compatible with osseous metastatic disease. Electronically Signed   By: Lovey Newcomer M.D.   On: 04/13/2017 13:22     Assessment and plan- Patient is a 71 y.o. male with newly diagnosed right lower lobe lung mass, mediastinal adenopathy, possible liver lesions and multiple bone lesions  I have personally reviewed the CT chest abdomen and pelvis images.  I also reviewed the imaging findings with Dr. Vernard Gambles from radiology who recommends a PET/CT scan or an MRI of the liver to characterize the liver lesions better to see if these are truly metastatic lesions and if so that would be the best site to biopsy.  I discussed these findings with the patient in detail.  At this time I will proceed with a PET/CT scan to complete his staging workup and get an MRI of the brain with and without contrast.  Depending on the findings of the PET/CT scan I will arrange for an IR guided biopsy either of the liver lesion or the right lower lobe lung mass.  I will see the patient back in 2 weeks time to discuss the results of the biopsy and further management.  Findings of CT scan are highly concerning for primary lung cancer that is now metastatic to the liver and the bones.  Given that he is been a lifelong non-smoker there is a high likelihood of finding molecular  alterations such as EGFR, AlK, Ros, MET or BRAF and therefore he may be a candidate for targeted therapy subsequently.  Treatment will be palliative and not curative  Discussed role of palliative RT to his back. Patient states his pain is tolerable and would like to hold off on that at this time  Given that patient has bone metastases he will also be a candidate  for monthly Xgeva.  Discussed risks and benefits of Xgeva including all but not limited to hypocalcemia and osteonecrosis of the jaw.  He will therefore need dental clearance before we proceed with Xgeva   Total face to face encounter time for this patient visit was 45 min. >50% of the time was  spent in counseling and coordination of care. Time spent in discussing CT scan findings possible diagnosis and implications of future treatment    Visit Diagnosis 1. Lung mass   2. Bone metastases ([place])   3. Goals of care, counseling/discussion      Dr. Randa Evens, MD, MPH [facility] Pager- [phone] 04/13/2017

## 2017-04-16 ENCOUNTER — Encounter: Payer: Self-pay | Admitting: *Deleted

## 2017-04-16 ENCOUNTER — Encounter: Payer: Self-pay | Admitting: Oncology

## 2017-04-16 LAB — UPEP/TP, 24-HR URINE
ALPHA 2, URINE: 0 %
Albumin, U: 100 %
Alpha 1, Urine: 0 %
BETA UR: 0 %
Gamma Globulin, Urine: 0 %
TOTAL PROTEIN, URINE-UR/DAY: 88 mg/(24.h) (ref 30–150)
Total Protein, Urine: 6.3 mg/dL
Total Volume: 1400

## 2017-04-17 ENCOUNTER — Ambulatory Visit: Payer: Medicare HMO

## 2017-04-21 ENCOUNTER — Ambulatory Visit
Admission: RE | Admit: 2017-04-21 | Discharge: 2017-04-21 | Disposition: A | Discharge status: Home / Self Care | Payer: Medicare HMO | Source: Ambulatory Visit | Attending: Oncology | Admitting: Oncology

## 2017-04-21 ENCOUNTER — Other Ambulatory Visit: Payer: Self-pay | Admitting: *Deleted

## 2017-04-21 DIAGNOSIS — R948 Abnormal results of function studies of other organs and systems: Secondary | ICD-10-CM

## 2017-04-21 DIAGNOSIS — C7951 Secondary malignant neoplasm of bone: Secondary | ICD-10-CM | POA: Insufficient documentation

## 2017-04-21 DIAGNOSIS — R918 Other nonspecific abnormal finding of lung field: Secondary | ICD-10-CM | POA: Diagnosis not present

## 2017-04-21 DIAGNOSIS — M899 Disorder of bone, unspecified: Secondary | ICD-10-CM

## 2017-04-21 DIAGNOSIS — R590 Localized enlarged lymph nodes: Secondary | ICD-10-CM | POA: Diagnosis not present

## 2017-04-21 DIAGNOSIS — R9389 Abnormal findings on diagnostic imaging of other specified body structures: Secondary | ICD-10-CM

## 2017-04-21 LAB — GLUCOSE, CAPILLARY: GLUCOSE-CAPILLARY: 104 mg/dL — AB (ref 65–99)

## 2017-04-21 MED ORDER — FLUDEOXYGLUCOSE F - 18 (FDG) INJECTION
12.5200 | Freq: Once | INTRAVENOUS | Status: AC | PRN
  Administered 2017-04-21: 12.52 via INTRAVENOUS

## 2017-04-21 MED ORDER — GADOBENATE DIMEGLUMINE 529 MG/ML IV SOLN
20.0000 mL | Freq: Once | INTRAVENOUS | Status: AC | PRN
  Administered 2017-04-21: 20 mL via INTRAVENOUS

## 2017-04-21 NOTE — Progress Notes (Signed)
[facility] Consultation      Assessment and Plan:  Lung nodule with paratracheal and hilar lymphadenopathy. -Right lower lobe nodule with scattered mediastinal lymphadenopathy, multiple bilateral lung nodules, right lower lobe nodule does not appear typical of lung cancer, in addition he is numerous spinal disease has progressed but was seen previously in 2013. -Case was discussed at tumor board, as well as with Dr. Janese Banks, Dr. Mortimer Fries, Dr. Alva Garnet, discussed with patient, we will await results of upcoming bone biopsy. - If nondiagnostic, patient will likely require bronchoscopy with ENB and possibly EBUS, for biopsy of the right lower lobe nodule as well as the left paratracheal lymph node, both of which had positive uptake on the PET scan.    Date: 04/22/2017  MRN# [id number] George Daniels 09-05-1945   George Daniels is a 72 y.o. old male seen in consultation for chief complaint of:    Chief Complaint  Patient presents with  . Advice Only    referred Dr. Janese Banks eval lung mass: denies any symptoms    HPI:  He is present with his wife here who gives some of the history. The problems started because of lower back pain for several years. In 2012 he had some abnormalities found in the spine, a follow up bone scan was apparently negative. In 2013 he had an MRI which he does not recall showing any difference.  More recently he had another MRI which showed that the lesion had progressed.  Dr. Janese Banks had compared his previous MRI from 2013 to his most recent one in 2018 which shows market progression of numerous bone lesions.  He has never been a smoker, he has some second smoke exposure from his father who was a smoker. His father had lung cancer.  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  No recent weight loss.  No travel outside the country.  Denies fevers.  Denies dyspnea cough  or other respiratory symptoms.   Images personally reviewed 04/21/17; increased SUV, particularly in the RLL nodule and left paratracheal node.  PET scan radiologist report iMPRESSION: 1. Multiple hypermetabolic pulmonary nodules bilaterally, including a dominant spiculated lesion in the right lower lobe, likely reflecting primary bronchogenic carcinoma. 2. Associated hypermetabolic mediastinal and bilateral hilar lymph nodes consistent with nodal metastases. 3. Widespread osseous metastatic disease. 4. No definite extra osseous tumor outside of the chest.    PMHX:   Past Medical History:  Diagnosis Date  . Arthritis    back  . Depression   . Diabetes mellitus without complication ([place])   . Family history of adverse reaction to anesthesia    daughter nausea  . GERD (gastroesophageal reflux disease)   . Hypertension   . RLS (restless legs syndrome)   . Sleep apnea    does not use cpap   Surgical Hx:  Past Surgical History:  Procedure Laterality Date  . ACHILLES TENDON REPAIR Right 2014  . SHOULDER ARTHROSCOPY WITH OPEN ROTATOR CUFF REPAIR Right 08/02/2015   Procedure: SHOULDER ARTHROSCOPY WITH LIMITED DEBRIDEMENT,  MINI OPEN ROTATOR CUFF REPAIR, DECOMPRESSION;  Surgeon: Corky Mull, MD;  Location: [facility];  Service: Orthopedics;  Laterality: Right;  . TENNIS ELBOW RELEASE/NIRSCHEL PROCEDURE Right 10/02/2016   Procedure: TENNIS ELBOW / OPEN DEBRIDMENT OF THE COMMON EXTENSOR ORGIN OF RIGHT ELBOW;  Surgeon: Corky Mull, MD;  Location: [facility];  Service: Orthopedics;  Laterality: Right;  . TONSILLECTOMY     Family Hx:  History reviewed. No pertinent family history. Social Hx:   Social History   Tobacco Use  . Smoking status: Never Smoker  . Smokeless tobacco: Never Used  Substance Use Topics  . Alcohol use: Yes    Alcohol/week: 0.0 - 0.6 oz    Comment: 1 bloody Mary every two weeks  . Drug use: No   Medication:    Current Outpatient Medications:  .  ALPRAZolam  (XANAX) 0.5 MG tablet, Take 0.5 mg by mouth daily as needed for anxiety. , Disp: , Rfl:  .  amLODipine (NORVASC) 5 MG tablet, Take 5 mg by mouth daily. , Disp: , Rfl:  .  aspirin 81 MG tablet, Take 81 mg by mouth every morning., Disp: , Rfl:  .  atorvastatin (LIPITOR) 10 MG tablet, Take 10 mg by mouth every morning., Disp: , Rfl:  .  cetirizine (ZYRTEC) 10 MG tablet, Take 10 mg by mouth at bedtime., Disp: , Rfl:  .  citalopram (CELEXA) 40 MG tablet, Take 40 mg by mouth every morning., Disp: , Rfl:  .  clotrimazole (LOTRIMIN) 1 % cream, Apply 1 application topically every other day., Disp: , Rfl:  .  gabapentin (NEURONTIN) 300 MG capsule, Take 300 mg by mouth at bedtime., Disp: , Rfl:  .  hydrocortisone cream 1 %, Apply 1 application topically every other day., Disp: , Rfl:  .  lisinopril-hydrochlorothiazide (PRINZIDE,ZESTORETIC) 20-25 MG tablet, Take 1 tablet by mouth every morning., Disp: , Rfl:  .  metFORMIN (GLUCOPHAGE-XR) 500 MG 24 hr tablet, Take 500 mg by mouth daily with supper. , Disp: , Rfl:  .  Multiple Vitamin (MULTIVITAMIN WITH MINERALS) TABS tablet, Take 1 tablet by mouth daily., Disp: , Rfl:  .  naproxen sodium (ALEVE) 220 MG tablet, Take 220 mg by mouth as needed. , Disp: , Rfl:  .  Omega-3 Fatty Acids (FISH OIL) 1000 MG CAPS, Take 1,000 mg by mouth daily., Disp: , Rfl:  .  omeprazole (PRILOSEC) 40 MG capsule, Take 40 mg by mouth every evening., Disp: , Rfl:  .  rOPINIRole (REQUIP) 1 MG tablet, Take 1 mg by mouth at bedtime. , Disp: , Rfl:  .  tiZANidine (ZANAFLEX) 2 MG tablet, Take 2 mg by mouth every 6 (six) hours as needed for muscle spasms., Disp: , Rfl:  .  traZODone (DESYREL) 100 MG tablet, Take 100 mg by mouth at bedtime., Disp: , Rfl:    Allergies:  Penicillins  Review of Systems: Gen:  Denies  fever, sweats, chills HEENT: Denies blurred vision, double vision. bleeds, sore throat Cvc:  No dizziness, chest pain. Resp:   Denies cough or sputum production, shortness of  breath Gi: Denies swallowing difficulty, stomach pain. Gu:  Denies bladder incontinence, burning urine Ext:   No Joint pain, stiffness. Skin: No skin rash,  hives  Endoc:  No polyuria, polydipsia. Psych: No depression, insomnia. Other:  All other systems were reviewed with the patient and were negative other that what is mentioned in the HPI.   Physical Examination:   VS: BP 136/70 (BP Location: Left Arm, Cuff Size: Normal)   Pulse 82   Ht 6' (1.829 m)   Wt 228 lb (103.4 kg)   SpO2 96%   BMI 30.92 kg/m   General Appearance: No distress  Neuro:without focal findings,  speech normal,  HEENT: PERRLA, EOM intact.   Pulmonary: normal breath sounds, No wheezing.  CardiovascularNormal S1,S2.  No m/r/g.  Abdomen: Benign, Soft, non-tender. Renal:  No costovertebral tenderness  GU:  No performed at this time. Endoc: No evident thyromegaly, no signs of acromegaly. Skin:   warm, no rashes, no ecchymosis  Extremities: normal, no cyanosis, clubbing.  Other findings:    LABORATORY PANEL:   CBC No results for input(s): WBC, HGB, HCT, PLT in the last 168 hours. ------------------------------------------------------------------------------------------------------------------  Chemistries  No results for input(s): NA, K, CL, CO2, GLUCOSE, BUN, CREATININE, CALCIUM, MG, AST, ALT, ALKPHOS, BILITOT in the last 168 hours.  Invalid input(s): GFRCGP ------------------------------------------------------------------------------------------------------------------  Cardiac Enzymes No results for input(s): TROPONINI in the last 168 hours. ------------------------------------------------------------  RADIOLOGY:  Mr Jeri Cos Wo Contrast  Result Date: 04/22/2017 CLINICAL DATA:  Lung mass.  Assessment for metastases. EXAM: MRI HEAD WITHOUT AND WITH CONTRAST TECHNIQUE: Multiplanar, multiecho pulse sequences of the brain and surrounding structures were obtained without and with intravenous contrast.  CONTRAST:  21mL MULTIHANCE GADOBENATE DIMEGLUMINE 529 MG/ML IV SOLN COMPARISON:  None. FINDINGS: Brain: The midline structures are normal. There is no acute infarct or acute hemorrhage. No mass lesion, hydrocephalus, dural abnormality or extra-axial collection. The brain parenchymal signal is normal. No age-advanced or lobar predominant atrophy. No chronic microhemorrhage or superficial siderosis. Vascular: Major intracranial arterial and venous sinus flow voids are preserved. Skull and upper cervical spine: The visualized skull base, calvarium, upper cervical spine and extracranial soft tissues are normal. Sinuses/Orbits: No fluid levels or advanced mucosal thickening. No mastoid or middle ear effusion. Normal orbits. IMPRESSION: Normal aging brain. No intracranial or calvarial metastatic disease. Electronically Signed   By: Ulyses Jarred M.D.   On: 04/22/2017 06:27   Nm Pet Image Initial (pi) Skull Base To Thigh  Result Date: 04/21/2017 CLINICAL DATA:  Initial treatment strategy for lung mass with osseous and possible hepatic metastases. History of diabetes. EXAM: NUCLEAR MEDICINE PET SKULL BASE TO THIGH TECHNIQUE: 12.52 mCi F-18 FDG was injected intravenously. Full-ring PET imaging was performed from the skull base to thigh after the radiotracer. CT data was obtained and used for attenuation correction and anatomic localization. FASTING BLOOD GLUCOSE:  Value: 104 mg/dl COMPARISON:  CTs and whole body bone scan 04/13/2017. FINDINGS: NECK No hypermetabolic cervical lymph nodes are identified.There are no lesions of the pharyngeal mucosal space. CHEST There are mildly hypermetabolic small mediastinal and hilar lymph nodes bilaterally. 11 mm AP window node on image 94 has an SUV max of 6.1. Left hilar node has an SUV max of 5.1. There are multiple hypermetabolic pulmonary nodules bilaterally. The dominant spiculated lesion posteriorly in the right lower lobe measures approximately 4.2 x 1.8 cm on image 121 and has  an SUV max of 5.2. This may reflect primary bronchogenic carcinoma. Multiple other nodules are similar to the recent chest CT. No significant vascular findings or significant pleural effusion. ABDOMEN/PELVIS There is no hypermetabolic activity within the liver, adrenal glands, spleen or pancreas. The previously demonstrated focal hepatic lesions are not well seen on this noncontrast study but do not show any focal hypermetabolic activity. There is underlying generalized hepatic steatosis. There are no hypermetabolic abdominopelvic lymph nodes. Bilateral renal cysts and aortoiliac atherosclerosis are noted. SKELETON There are multiple hypermetabolic osseous metastases throughout the axial and proximal appendicular skeleton. Representative lesions include a lytic T9 lesion with an SUV max of 7.5 and a left acetabular lesion with an SUV max of 8.2. Lesions are present within the right scapula, the left humeral neck, multiple ribs, the bony pelvis and both proximal femurs. No pathologic fracture identified.  Hypermetabolic activity associated with the left common hamstring tendon consistent with tendinosis. IMPRESSION: 1. Multiple hypermetabolic pulmonary nodules bilaterally, including a dominant spiculated lesion in the right lower lobe, likely reflecting primary bronchogenic carcinoma. 2. Associated hypermetabolic mediastinal and bilateral hilar lymph nodes consistent with nodal metastases. 3. Widespread osseous metastatic disease. 4. No definite extra osseous tumor outside of the chest. Electronically Signed   By: Richardean Sale M.D.   On: 04/21/2017 12:33       Thank  you for the consultation and for allowing [facility] to assist in the care of your patient. Our recommendations are noted above.  Please contact us if we can be of further service.   Marda Stalker, MD.  Board Certified in Internal Medicine, Pulmonary Medicine, [place], and Sleep Medicine.    [facility] Office Number: [phone]  Patricia Pesa, M.D.  Merton Border, M.D  04/22/2017

## 2017-04-22 ENCOUNTER — Ambulatory Visit: Payer: Medicare HMO | Admitting: Internal Medicine

## 2017-04-22 ENCOUNTER — Encounter: Payer: Self-pay | Admitting: Internal Medicine

## 2017-04-22 VITALS — BP 136/70 | HR 82 | Ht 72.0 in | Wt 228.0 lb

## 2017-04-22 DIAGNOSIS — R911 Solitary pulmonary nodule: Secondary | ICD-10-CM

## 2017-04-22 DIAGNOSIS — IMO0001 Reserved for inherently not codable concepts without codable children: Secondary | ICD-10-CM

## 2017-04-22 NOTE — Patient Instructions (Signed)
Will await results of bone biopsy. If this is negative will schedule you for a lung biopsy.

## 2017-04-24 ENCOUNTER — Ambulatory Visit: Payer: Medicare HMO

## 2017-04-24 ENCOUNTER — Telehealth: Payer: Self-pay | Admitting: *Deleted

## 2017-04-24 ENCOUNTER — Other Ambulatory Visit: Payer: Self-pay | Admitting: *Deleted

## 2017-04-24 NOTE — Telephone Encounter (Signed)
Left message on patient's VM to call us back re: CT guided biopsy appointment and instructions.      dhs

## 2017-04-27 ENCOUNTER — Telehealth: Payer: Self-pay | Admitting: *Deleted

## 2017-04-27 NOTE — Telephone Encounter (Signed)
Called and spoke to pt and wife.  They had got the my char tabout bx ana dknew NPO, have a driver and taking meds with sips of water for the procedure.  I did tell them that I would ask Dr Janese Banks about f/u appt when she felt like bx results would be ready and rtn call back tom. With that date.

## 2017-04-29 ENCOUNTER — Other Ambulatory Visit: Payer: Self-pay | Admitting: Radiology

## 2017-04-29 MED ORDER — MIDAZOLAM HCL 5 MG/5ML IJ SOLN
INTRAMUSCULAR | Status: AC
  Filled 2017-04-29: qty 5

## 2017-04-29 MED ORDER — FENTANYL CITRATE (PF) 100 MCG/2ML IJ SOLN
INTRAMUSCULAR | Status: AC
  Filled 2017-04-29: qty 4

## 2017-04-30 ENCOUNTER — Ambulatory Visit
Admission: RE | Admit: 2017-04-30 | Discharge: 2017-04-30 | Disposition: A | Discharge status: Home / Self Care | Payer: Medicare HMO | Source: Ambulatory Visit | Attending: Oncology | Admitting: Oncology

## 2017-04-30 ENCOUNTER — Telehealth: Payer: Self-pay | Admitting: *Deleted

## 2017-04-30 DIAGNOSIS — G8929 Other chronic pain: Secondary | ICD-10-CM | POA: Diagnosis not present

## 2017-04-30 DIAGNOSIS — R918 Other nonspecific abnormal finding of lung field: Secondary | ICD-10-CM | POA: Diagnosis not present

## 2017-04-30 DIAGNOSIS — M899 Disorder of bone, unspecified: Secondary | ICD-10-CM | POA: Insufficient documentation

## 2017-04-30 DIAGNOSIS — K219 Gastro-esophageal reflux disease without esophagitis: Secondary | ICD-10-CM | POA: Insufficient documentation

## 2017-04-30 DIAGNOSIS — M545 Low back pain: Secondary | ICD-10-CM | POA: Diagnosis not present

## 2017-04-30 DIAGNOSIS — Z880 Allergy status to penicillin: Secondary | ICD-10-CM | POA: Insufficient documentation

## 2017-04-30 DIAGNOSIS — F329 Major depressive disorder, single episode, unspecified: Secondary | ICD-10-CM | POA: Insufficient documentation

## 2017-04-30 DIAGNOSIS — G4730 Sleep apnea, unspecified: Secondary | ICD-10-CM | POA: Insufficient documentation

## 2017-04-30 DIAGNOSIS — I10 Essential (primary) hypertension: Secondary | ICD-10-CM | POA: Insufficient documentation

## 2017-04-30 DIAGNOSIS — R948 Abnormal results of function studies of other organs and systems: Secondary | ICD-10-CM | POA: Diagnosis not present

## 2017-04-30 DIAGNOSIS — M79605 Pain in left leg: Secondary | ICD-10-CM | POA: Insufficient documentation

## 2017-04-30 DIAGNOSIS — M898X8 Other specified disorders of bone, other site: Secondary | ICD-10-CM | POA: Diagnosis not present

## 2017-04-30 DIAGNOSIS — Z8546 Personal history of malignant neoplasm of prostate: Secondary | ICD-10-CM | POA: Diagnosis not present

## 2017-04-30 DIAGNOSIS — M5126 Other intervertebral disc displacement, lumbar region: Secondary | ICD-10-CM | POA: Insufficient documentation

## 2017-04-30 DIAGNOSIS — M1990 Unspecified osteoarthritis, unspecified site: Secondary | ICD-10-CM | POA: Diagnosis not present

## 2017-04-30 DIAGNOSIS — Z7984 Long term (current) use of oral hypoglycemic drugs: Secondary | ICD-10-CM | POA: Diagnosis not present

## 2017-04-30 DIAGNOSIS — E119 Type 2 diabetes mellitus without complications: Secondary | ICD-10-CM | POA: Insufficient documentation

## 2017-04-30 DIAGNOSIS — R590 Localized enlarged lymph nodes: Secondary | ICD-10-CM | POA: Insufficient documentation

## 2017-04-30 DIAGNOSIS — C7951 Secondary malignant neoplasm of bone: Secondary | ICD-10-CM | POA: Insufficient documentation

## 2017-04-30 DIAGNOSIS — Z79899 Other long term (current) drug therapy: Secondary | ICD-10-CM | POA: Insufficient documentation

## 2017-04-30 DIAGNOSIS — R9389 Abnormal findings on diagnostic imaging of other specified body structures: Secondary | ICD-10-CM | POA: Insufficient documentation

## 2017-04-30 DIAGNOSIS — M48061 Spinal stenosis, lumbar region without neurogenic claudication: Secondary | ICD-10-CM | POA: Insufficient documentation

## 2017-04-30 HISTORY — DX: Adverse effect of unspecified anesthetic, initial encounter: T41.45XA

## 2017-04-30 HISTORY — DX: Nausea: R11.0

## 2017-04-30 HISTORY — DX: Other complications of anesthesia, initial encounter: T88.59XA

## 2017-04-30 LAB — CBC
HCT: 42 % (ref 40.0–52.0)
Hemoglobin: 14.4 g/dL (ref 13.0–18.0)
MCH: 30.8 pg (ref 26.0–34.0)
MCHC: 34.4 g/dL (ref 32.0–36.0)
MCV: 89.4 fL (ref 80.0–100.0)
PLATELETS: 158 10*3/uL (ref 150–440)
RBC: 4.69 MIL/uL (ref 4.40–5.90)
RDW: 13.5 % (ref 11.5–14.5)
WBC: 4.6 10*3/uL (ref 3.8–10.6)

## 2017-04-30 LAB — GLUCOSE, CAPILLARY: Glucose-Capillary: 104 mg/dL — ABNORMAL HIGH (ref 65–99)

## 2017-04-30 LAB — PROTIME-INR
INR: 1.05
Prothrombin Time: 13.6 seconds (ref 11.4–15.2)

## 2017-04-30 LAB — APTT: APTT: 30 s (ref 24–36)

## 2017-04-30 MED ORDER — SODIUM CHLORIDE 0.9 % IV SOLN
INTRAVENOUS | Status: DC
  Administered 2017-04-30: 09:00:00 via INTRAVENOUS

## 2017-04-30 MED ORDER — FENTANYL CITRATE (PF) 100 MCG/2ML IJ SOLN
INTRAMUSCULAR | Status: AC
  Filled 2017-04-30: qty 4

## 2017-04-30 MED ORDER — FENTANYL CITRATE (PF) 100 MCG/2ML IJ SOLN
INTRAMUSCULAR | Status: AC | PRN
  Administered 2017-04-30: 50 ug via INTRAVENOUS
  Administered 2017-04-30: 25 ug via INTRAVENOUS

## 2017-04-30 MED ORDER — MIDAZOLAM HCL 5 MG/5ML IJ SOLN
INTRAMUSCULAR | Status: AC | PRN
  Administered 2017-04-30 (×3): 1 mg via INTRAVENOUS

## 2017-04-30 MED ORDER — LIDOCAINE HCL (PF) 1 % IJ SOLN
INTRAMUSCULAR | Status: AC | PRN
  Administered 2017-04-30: 10 mL

## 2017-04-30 MED ORDER — MIDAZOLAM HCL 5 MG/5ML IJ SOLN
INTRAMUSCULAR | Status: AC
  Filled 2017-04-30: qty 5

## 2017-04-30 NOTE — Telephone Encounter (Signed)
Call patient to let them know that he did have his biopsy today and he was okay at home.  Follow-up appointment is  Monday 2 PM in [place].  Call around lunchtime to make sure we have the report if we do not have pathology back by then I will contact patient and we will reschedule to another day. patient agreeable to plan.

## 2017-04-30 NOTE — Procedures (Signed)
Pre procedural Dx: Bone lesion  Post procedural Dx: Same  Technically successful CT guided biopsy of indeterminate lesion within the posterior aspect of the right ilium.   EBL: None.   Complications: None immediate.   Ronny Bacon, MD Pager #: [phone]

## 2017-04-30 NOTE — Consult Note (Signed)
Chief Complaint: Metastatic disease of unknown primary  Referring Physician(s): Rao,Archana C  Patient Status: [facility] - Out-pt  History of Present Illness: George Daniels is a 72 y.o. male with a past medical history significant forhypertension, diabetes, sleep apnea and chronic back pain. He reports that his back pain has been gradually getting worse over the last few months which prompted MRI of the lumbar spine without contrast.  MRI performed on 04/01/2017 demonstrated multiple new lesions in his bones, however some of which were present in 2013.  Subsequent CT of the chest, abdomen and pelvis performed 04/13/2017 demonstrated  a large irregular mass in the right lower lobe measuring 6.4 x 3.2 cm in size, multiple bilateral irregular pulmonary nodules and mediastinal adenopathy ranging from 1-1.3 cm all of which appeared hypermetabolic on PET/CT performed 04/21/2017.    Given concern for metastatic disease, request is made for CT-guided bone lesion biopsy for tissue diagnostic purposes. He is accompanied by his wife though serves as his own historian.  Other than chronic back pain which is been acutely worse over the last couple of months, the patient reports feeling well otherwise. Specifically, he denies change in energy level or unintentional weight loss. No chest pain or terms of breath. No fever or chills.    Past Medical History:  Diagnosis Date  . Arthritis    back  . Complication of anesthesia    nausea  . Depression   . Diabetes mellitus without complication ([place])   . Family history of adverse reaction to anesthesia    daughter nausea  . GERD (gastroesophageal reflux disease)   . Hypertension   . Nausea   . RLS (restless legs syndrome)   . Sleep apnea    does not use cpap    Past Surgical History:  Procedure Laterality Date  . ACHILLES TENDON REPAIR Right 2014  . SHOULDER ARTHROSCOPY WITH OPEN ROTATOR CUFF REPAIR Right 08/02/2015   Procedure: SHOULDER  ARTHROSCOPY WITH LIMITED DEBRIDEMENT,  MINI OPEN ROTATOR CUFF REPAIR, DECOMPRESSION;  Surgeon: Corky Mull, MD;  Location: [facility];  Service: Orthopedics;  Laterality: Right;  . TENNIS ELBOW RELEASE/NIRSCHEL PROCEDURE Right 10/02/2016   Procedure: TENNIS ELBOW / OPEN DEBRIDMENT OF THE COMMON EXTENSOR ORGIN OF RIGHT ELBOW;  Surgeon: Corky Mull, MD;  Location: [facility];  Service: Orthopedics;  Laterality: Right;  . TONSILLECTOMY      Allergies: Penicillins  Medications: Prior to Admission medications   Medication Sig Start Date End Date Taking? Authorizing Provider  ALPRAZolam Duanne Moron) 0.5 MG tablet Take 0.5 mg by mouth daily as needed for anxiety.  10/30/15  Yes [provider]  amLODipine (NORVASC) 5 MG tablet Take 5 mg by mouth daily.    Yes [provider]  aspirin 81 MG tablet Take 81 mg by mouth every morning.   Yes [provider]  atorvastatin (LIPITOR) 10 MG tablet Take 10 mg by mouth every morning.   Yes [provider]  cetirizine (ZYRTEC) 10 MG tablet Take 10 mg by mouth at bedtime.   Yes [provider]  citalopram (CELEXA) 40 MG tablet Take 40 mg by mouth every morning.   Yes [provider]  clotrimazole (LOTRIMIN) 1 % cream Apply 1 application topically every other day.   Yes [provider]  gabapentin (NEURONTIN) 300 MG capsule Take 300 mg by mouth at bedtime.   Yes [provider]  hydrocortisone cream 1 % Apply 1 application topically every other day.   Yes [provider]  lisinopril-hydrochlorothiazide (PRINZIDE,ZESTORETIC) 20-25 MG tablet Take 1 tablet by mouth every morning.   Yes [provider]  metFORMIN (GLUCOPHAGE-XR) 500 MG 24 hr tablet Take 500 mg by mouth daily with supper.  05/14/16 05/14/17 Yes [provider]  Multiple Vitamin (MULTIVITAMIN WITH MINERALS) TABS tablet Take 1 tablet by mouth daily.   Yes [provider]  naproxen sodium (ALEVE) 220 MG  tablet Take 220 mg by mouth as needed.    Yes [provider]  Omega-3 Fatty Acids (FISH OIL) 1000 MG CAPS Take 1,000 mg by mouth daily.   Yes [provider]  omeprazole (PRILOSEC) 40 MG capsule Take 40 mg by mouth every evening.   Yes [provider]  rOPINIRole (REQUIP) 1 MG tablet Take 1 mg by mouth at bedtime.  07/21/16  Yes [provider]  tiZANidine (ZANAFLEX) 2 MG tablet Take 2 mg by mouth every 6 (six) hours as needed for muscle spasms.   Yes [provider]  traZODone (DESYREL) 100 MG tablet Take 100 mg by mouth at bedtime.   Yes [provider]     History reviewed. No pertinent family history.  Social History   Socioeconomic History  . Marital status: Married    Spouse name: None  . Number of children: None  . Years of education: None  . Highest education level: None  Social Needs  . Financial resource strain: None  . Food insecurity - worry: None  . Food insecurity - inability: None  . Transportation needs - medical: None  . Transportation needs - non-medical: None  Occupational History  . None  Tobacco Use  . Smoking status: Never Smoker  . Smokeless tobacco: Never Used  Substance and Sexual Activity  . Alcohol use: Yes    Alcohol/week: 0.0 - 0.6 oz    Comment: 1 bloody Mary every two weeks  . Drug use: No  . Sexual activity: None  Other Topics Concern  . None  Social History Narrative  . None    ECOG Status: 1 - Symptomatic but completely ambulatory  Review of Systems: A 12 point ROS discussed and pertinent positives are indicated in the HPI above.  All other systems are negative.  Review of Systems  Constitutional: Negative.   Respiratory: Negative.   Cardiovascular: Negative.   Musculoskeletal: Positive for back pain.    Vital Signs: BP (!) 126/59   Pulse 67   Temp 98.1 F (36.7 C) (Oral)   Resp 16   SpO2 100%   Physical Exam  Constitutional: He appears well-developed and  well-nourished.  HENT:  Head: Normocephalic and atraumatic.  Cardiovascular: Normal rate and regular rhythm.  Pulmonary/Chest: Effort normal and breath sounds normal.  Skin: Skin is warm and dry.  Psychiatric: He has a normal mood and affect. His behavior is normal.  Nursing note and vitals reviewed.   Imaging: Ct Chest W Contrast  Result Date: 04/13/2017 CLINICAL DATA:  Patient with suspicious osseous lesions on prior MRI concerning for the possibility of metastatic prostate cancer. EXAM: CT CHEST, ABDOMEN, AND PELVIS WITH CONTRAST TECHNIQUE: Multidetector CT imaging of the chest, abdomen and pelvis was performed following the standard protocol during bolus administration of intravenous contrast. CONTRAST:  158m ISOVUE-300 IOPAMIDOL (ISOVUE-300) INJECTION 61% COMPARISON:  MR lumbar spine 04/01/2017 FINDINGS: CT CHEST FINDINGS Cardiovascular: Normal heart size. No pericardial effusion. Thoracic aortic vascular calcifications. Aorta and main pulmonary artery are normal in caliber. Mediastinum/Nodes: Multiple prominent and mildly enlarged mediastinal lymph nodes including a  1.0 cm right peritracheal lymph node, a 1.2 cm prevascular lymph node (image 20; series 2) and a 1.3 cm subcarinal lymph node (image 26; series 2). Additional prominent subcentimeter lymph nodes are demonstrated. Esophagus is normal in appearance. Lungs/Pleura: Central airways are patent. Within the right lower lobe there is a 6.4 x 3.2 cm irregular mass (image 109; series 4). There are multiple bilateral irregular pulmonary nodules demonstrated throughout the lungs bilaterally with a reference 1.3 cm nodule in the left upper lobe (image 36; series 4) and a 1.5 cm nodule within the right middle lobe (image 74; series 4). No pleural effusion or pneumothorax. Musculoskeletal: Multiple lytic lesions throughout the visualized thoracic spine including the T3, T4 and T7 vertebral bodies. CT ABDOMEN PELVIS FINDINGS Hepatobiliary: There is a  1.9 x 1.7 cm indeterminate low-attenuation lesion within the right hepatic lobe (image 54; series 2). There is an additional 1.1 cm low-attenuation lesion within the hepatic dome (image 49; series 2). Additional subcentimeter low-attenuation lesions are demonstrated within the liver. Gallbladder is unremarkable. No intrahepatic or extrahepatic biliary ductal dilatation. Pancreas: Unremarkable Spleen:  Unremarkable Adrenals/Urinary Tract: The adrenal glands are normal. Kidneys enhance symmetrically with contrast. There is a 4.7 cm exophytic cyst off the interpolar region of the left kidney. There is a 4.0 cm cyst within the interpolar region of the right kidney and a 1.8 cm cyst within the inferior pole of the right kidney. No suspicious enhancing renal masses identified. No hydronephrosis. Urinary bladder is unremarkable. Stomach/Bowel: Normal morphology of the stomach. No abnormal bowel wall thickening or evidence for bowel obstruction. No free fluid or free intraperitoneal air. Vascular/Lymphatic: Normal caliber abdominal aorta. No retroperitoneal lymphadenopathy. Peripheral calcified atherosclerotic plaque. Reproductive: Prostate is heterogeneous. Other: Small fat containing left inguinal hernia. Musculoskeletal: Multiple lytic lesions are demonstrated throughout the visualized lumbar spine and pelvis including a 2.4 cm lesion within the left ilium (image 108; series 2) and a 2.4 cm lesion within the L4 vertebral body. IMPRESSION: 1. Large irregular mass within the right lower lobe which may represent primary pulmonary malignancy or metastatic disease. 2. Multiple small irregular pulmonary nodules throughout the lungs bilaterally concerning for metastatic disease. 3. Prominent and mildly enlarged mediastinal lymph nodes concerning for metastatic disease. 4. Indeterminate hepatic lesions raising the possibility of metastatic disease. Depending upon pathologic diagnosis and clinical treatment, consider further  evaluation with MRI. 5. Multiple lytic lesions throughout the skeleton compatible with osseous metastatic disease. Electronically Signed   By: Lovey Newcomer M.D.   On: 04/13/2017 13:22   Mr Jeri Cos PY Contrast  Result Date: 04/22/2017 CLINICAL DATA:  Lung mass.  Assessment for metastases. EXAM: MRI HEAD WITHOUT AND WITH CONTRAST TECHNIQUE: Multiplanar, multiecho pulse sequences of the brain and surrounding structures were obtained without and with intravenous contrast. CONTRAST:  53m MULTIHANCE GADOBENATE DIMEGLUMINE 529 MG/ML IV SOLN COMPARISON:  None. FINDINGS: Brain: The midline structures are normal. There is no acute infarct or acute hemorrhage. No mass lesion, hydrocephalus, dural abnormality or extra-axial collection. The brain parenchymal signal is normal. No age-advanced or lobar predominant atrophy. No chronic microhemorrhage or superficial siderosis. Vascular: Major intracranial arterial and venous sinus flow voids are preserved. Skull and upper cervical spine: The visualized skull base, calvarium, upper cervical spine and extracranial soft tissues are normal. Sinuses/Orbits: No fluid levels or advanced mucosal thickening. No mastoid or middle ear effusion. Normal orbits. IMPRESSION: Normal aging brain. No intracranial or calvarial metastatic disease. Electronically Signed   By: KUlyses JarredM.D.   On:  04/22/2017 06:27   Mr Lumbar Spine Wo Contrast  Result Date: 04/02/2017 CLINICAL DATA:  Chronic low back pain and left leg pain. EXAM: MRI LUMBAR SPINE WITHOUT CONTRAST TECHNIQUE: Multiplanar, multisequence MR imaging of the lumbar spine was performed. No intravenous contrast was administered. COMPARISON:  Radiographs dated 05/30/2014 and MRI dated 05/21/2011 FINDINGS: Segmentation:  Standard. Alignment: Minimal retrolisthesis of L1 on L2 and of L2 on L3. Minimal anterolisthesis of L4 on L5. Vertebrae: There has been marked progression in the size of all of the lesions demonstrated on the prior exam  of 05/21/2011. In addition, there are new lesions in T12, L4, L5, and S3. The previously demonstrated lesions in L1, L2, L3, L4, and S1 have all enlarged. There is no pathologic fracture or cortical disruption. No extension of the lesions into the soft tissues or spinal canal. Conus medullaris and cauda equina: Conus extends to the L1 level. Conus and cauda equina appear normal. Paraspinal and other soft tissues: Bilateral benign-appearing renal cysts. Otherwise negative. Disc levels: T12-L1:  Normal disc.  No neural impingement. L1-2: New disc desiccation and disc space narrowing with a small broad-based disc bulge with accompanying osteophytes without neural impingement. Marked progression of bone lesions in the vertebral bodies extending into the posterior elements of L1 and L2. L2-3: New disc desiccation and disc space narrowing with a small broad-based disc bulge with accompanying osteophytes with a small soft disc protrusion into the left neural foramen. The diffuse disc bulge creates moderate compression of the thecal sac without focal neural impingement. Slight hypertrophy of the ligamentum flavum and facet joints. Progressive bone lesions in the L3 vertebra extending into the left side of the posterior elements. L3-4: New disc desiccation. New small broad-based disc bulge without neural impingement. Small protrusion into the lateral aspect of the left neural foramen which posteriorly deviates the left L3 nerve lateral to the neural foramen, best seen on image 23 of series 5. Progression of bone lesions in L4. L4-5: New disc desiccation with a small broad-based disc bulge. Progressive now severe bilateral facet arthritis with increased ligamentum flavum hypertrophy combining with the disc bulge to create severe spinal stenosis and moderately severe bilateral foraminal stenosis. New small lesion in the superior aspect of the L5 vertebral body. L5-S1: New slight disc bulge central and to the left. Moderately  severe bilateral facet arthritis without focal neural impingement. Marked progression of bone lesion in the S1 segment. Sacrum: New lesions in S2. Lesion and S3. Possible small lesion in the right ilium seen on image 38 of series 5. IMPRESSION: 1. Marked progression of numerous bone lesions consistent with metastatic disease. Multiple new lesions as described. Given the almost 6 year time interval between the 2 MRI scans, I suspect that the most likely diagnosis is prostate cancer. 2. New severe spinal stenosis at L4-5 due to a combination of hypertrophy of the ligamentum flavum and facet joints and a broad-based disc bulge. 3. Chronic progressive severe bilateral facet arthritis at L5-S1 without focal neural impingement. Electronically Signed   By: Lorriane Shire M.D.   On: 04/02/2017 10:39   Nm Bone Scan Whole Body  Result Date: 04/13/2017 CLINICAL DATA:  Abnormal MRI with question bone lesions, reportedly normal PSA EXAM: NUCLEAR MEDICINE WHOLE BODY BONE SCAN TECHNIQUE: Whole body anterior and posterior images were obtained approximately 3 hours after intravenous injection of radiopharmaceutical. RADIOPHARMACEUTICALS:  22.70 mCi Technetium-25mMDP IV COMPARISON:  11/11/2010 Radiographic correlation: MRI lumbar spine 04/01/2017, CT chest abdomen pelvis 04/13/2017 FINDINGS: Multiple abnormal  sites of increased osseous tracer accumulation are identified within the thoracic and lumbar spine. Additional increased abnormal tracer uptake is seen at the RIGHT ilium at the inferior aspect of the RIGHT SI joint. No additional sites of concerning abnormal osseous tracer accumulation are seen. Uptake at the sternoclavicular and current clavicular joints typically degenerative. Expected urinary tract and soft tissue distribution of tracer. IMPRESSION: Multiple abnormal sites of increased osseous tracer accumulation within the thoracic spine, lumbar spine and RIGHT ilium adjacent SI joint compatible with osseous  metastatic disease. These correspond areas of lucent lesions on CT. While multiple myeloma could cause the lytic lesions identified on CT, it typically demonstrates decreased rather than increased tracer accumulation on bone scintigraphy making osseous metastatic disease the primary consideration. Electronically Signed   By: Lavonia Dana M.D.   On: 04/13/2017 15:21   Ct Abdomen Pelvis W Contrast  Result Date: 04/13/2017 CLINICAL DATA:  Patient with suspicious osseous lesions on prior MRI concerning for the possibility of metastatic prostate cancer. EXAM: CT CHEST, ABDOMEN, AND PELVIS WITH CONTRAST TECHNIQUE: Multidetector CT imaging of the chest, abdomen and pelvis was performed following the standard protocol during bolus administration of intravenous contrast. CONTRAST:  146m ISOVUE-300 IOPAMIDOL (ISOVUE-300) INJECTION 61% COMPARISON:  MR lumbar spine 04/01/2017 FINDINGS: CT CHEST FINDINGS Cardiovascular: Normal heart size. No pericardial effusion. Thoracic aortic vascular calcifications. Aorta and main pulmonary artery are normal in caliber. Mediastinum/Nodes: Multiple prominent and mildly enlarged mediastinal lymph nodes including a 1.0 cm right peritracheal lymph node, a 1.2 cm prevascular lymph node (image 20; series 2) and a 1.3 cm subcarinal lymph node (image 26; series 2). Additional prominent subcentimeter lymph nodes are demonstrated. Esophagus is normal in appearance. Lungs/Pleura: Central airways are patent. Within the right lower lobe there is a 6.4 x 3.2 cm irregular mass (image 109; series 4). There are multiple bilateral irregular pulmonary nodules demonstrated throughout the lungs bilaterally with a reference 1.3 cm nodule in the left upper lobe (image 36; series 4) and a 1.5 cm nodule within the right middle lobe (image 74; series 4). No pleural effusion or pneumothorax. Musculoskeletal: Multiple lytic lesions throughout the visualized thoracic spine including the T3, T4 and T7 vertebral  bodies. CT ABDOMEN PELVIS FINDINGS Hepatobiliary: There is a 1.9 x 1.7 cm indeterminate low-attenuation lesion within the right hepatic lobe (image 54; series 2). There is an additional 1.1 cm low-attenuation lesion within the hepatic dome (image 49; series 2). Additional subcentimeter low-attenuation lesions are demonstrated within the liver. Gallbladder is unremarkable. No intrahepatic or extrahepatic biliary ductal dilatation. Pancreas: Unremarkable Spleen:  Unremarkable Adrenals/Urinary Tract: The adrenal glands are normal. Kidneys enhance symmetrically with contrast. There is a 4.7 cm exophytic cyst off the interpolar region of the left kidney. There is a 4.0 cm cyst within the interpolar region of the right kidney and a 1.8 cm cyst within the inferior pole of the right kidney. No suspicious enhancing renal masses identified. No hydronephrosis. Urinary bladder is unremarkable. Stomach/Bowel: Normal morphology of the stomach. No abnormal bowel wall thickening or evidence for bowel obstruction. No free fluid or free intraperitoneal air. Vascular/Lymphatic: Normal caliber abdominal aorta. No retroperitoneal lymphadenopathy. Peripheral calcified atherosclerotic plaque. Reproductive: Prostate is heterogeneous. Other: Small fat containing left inguinal hernia. Musculoskeletal: Multiple lytic lesions are demonstrated throughout the visualized lumbar spine and pelvis including a 2.4 cm lesion within the left ilium (image 108; series 2) and a 2.4 cm lesion within the L4 vertebral body. IMPRESSION: 1. Large irregular mass within the right lower lobe  which may represent primary pulmonary malignancy or metastatic disease. 2. Multiple small irregular pulmonary nodules throughout the lungs bilaterally concerning for metastatic disease. 3. Prominent and mildly enlarged mediastinal lymph nodes concerning for metastatic disease. 4. Indeterminate hepatic lesions raising the possibility of metastatic disease. Depending upon  pathologic diagnosis and clinical treatment, consider further evaluation with MRI. 5. Multiple lytic lesions throughout the skeleton compatible with osseous metastatic disease. Electronically Signed   By: Lovey Newcomer M.D.   On: 04/13/2017 13:22   Nm Pet Image Initial (pi) Skull Base To Thigh  Result Date: 04/21/2017 CLINICAL DATA:  Initial treatment strategy for lung mass with osseous and possible hepatic metastases. History of diabetes. EXAM: NUCLEAR MEDICINE PET SKULL BASE TO THIGH TECHNIQUE: 12.52 mCi F-18 FDG was injected intravenously. Full-ring PET imaging was performed from the skull base to thigh after the radiotracer. CT data was obtained and used for attenuation correction and anatomic localization. FASTING BLOOD GLUCOSE:  Value: 104 mg/dl COMPARISON:  CTs and whole body bone scan 04/13/2017. FINDINGS: NECK No hypermetabolic cervical lymph nodes are identified.There are no lesions of the pharyngeal mucosal space. CHEST There are mildly hypermetabolic small mediastinal and hilar lymph nodes bilaterally. 11 mm AP window node on image 94 has an SUV max of 6.1. Left hilar node has an SUV max of 5.1. There are multiple hypermetabolic pulmonary nodules bilaterally. The dominant spiculated lesion posteriorly in the right lower lobe measures approximately 4.2 x 1.8 cm on image 121 and has an SUV max of 5.2. This may reflect primary bronchogenic carcinoma. Multiple other nodules are similar to the recent chest CT. No significant vascular findings or significant pleural effusion. ABDOMEN/PELVIS There is no hypermetabolic activity within the liver, adrenal glands, spleen or pancreas. The previously demonstrated focal hepatic lesions are not well seen on this noncontrast study but do not show any focal hypermetabolic activity. There is underlying generalized hepatic steatosis. There are no hypermetabolic abdominopelvic lymph nodes. Bilateral renal cysts and aortoiliac atherosclerosis are noted. SKELETON There are  multiple hypermetabolic osseous metastases throughout the axial and proximal appendicular skeleton. Representative lesions include a lytic T9 lesion with an SUV max of 7.5 and a left acetabular lesion with an SUV max of 8.2. Lesions are present within the right scapula, the left humeral neck, multiple ribs, the bony pelvis and both proximal femurs. No pathologic fracture identified. Hypermetabolic activity associated with the left common hamstring tendon consistent with tendinosis. IMPRESSION: 1. Multiple hypermetabolic pulmonary nodules bilaterally, including a dominant spiculated lesion in the right lower lobe, likely reflecting primary bronchogenic carcinoma. 2. Associated hypermetabolic mediastinal and bilateral hilar lymph nodes consistent with nodal metastases. 3. Widespread osseous metastatic disease. 4. No definite extra osseous tumor outside of the chest. Electronically Signed   By: Richardean Sale M.D.   On: 04/21/2017 12:33    Labs:  CBC: Recent Labs    09/26/16 1325 04/03/17 1231 04/30/17 0810  WBC 4.9 6.1 4.6  HGB 14.7 14.9 14.4  HCT 42.4 43.1 42.0  PLT 152 183 158    COAGS: Recent Labs    04/30/17 0810  INR 1.05  APTT 30    BMP: Recent Labs    09/26/16 1325 04/03/17 1231  NA  --  134*  K 3.9 3.8  CL  --  95*  CO2  --  30  GLUCOSE  --  114*  BUN  --  14  CALCIUM  --  9.4  CREATININE  --  0.96  GFRNONAA  --  >60  GFRAA  --  >60    LIVER FUNCTION TESTS: Recent Labs    04/03/17 1231  BILITOT 1.1  AST 24  ALT 26  ALKPHOS 97  PROT 7.4  ALBUMIN 4.2    TUMOR MARKERS: No results for input(s): AFPTM, CEA, CA199, CHROMGRNA in the last 8760 hours.  Assessment and Plan:  George Daniels is a 72 y.o. male with a past medical history significant forhypertension, diabetes, sleep apnea and chronic back pain, now with imaging findings worrisome for advanced metastatic disease of unknown primary. As such, patient presents today for CT-guided bone lesion biopsy for  tissue diagnostic purposes.  Risks and benefits of CT guided bone lesion biopsy were discussed with the patient including, but not limited to bleeding, infection, damage to adjacent structures or low yield requiring additional tests.  All of the patient's questions were answered, patient is agreeable to proceed.  Consent signed and in chart.  Thank you for this interesting consult.  I greatly enjoyed meeting George Daniels and look forward to participating in their care.  A copy of this report was sent to the requesting provider on this date.  Electronically Signed: Sandi Mariscal, MD 04/30/2017, 8:57 AM   I spent a total of 15 Minutes  in face to face in clinical consultation, greater than 50% of which was counseling/coordinating care for CT guided bone lesion biopsy.

## 2017-05-01 DIAGNOSIS — S63502D Unspecified sprain of left wrist, subsequent encounter: Secondary | ICD-10-CM | POA: Diagnosis not present

## 2017-05-04 ENCOUNTER — Telehealth: Payer: Self-pay | Admitting: *Deleted

## 2017-05-04 ENCOUNTER — Ambulatory Visit: Payer: Medicare HMO | Admitting: Oncology

## 2017-05-04 ENCOUNTER — Other Ambulatory Visit: Payer: Self-pay | Admitting: *Deleted

## 2017-05-04 DIAGNOSIS — R918 Other nonspecific abnormal finding of lung field: Secondary | ICD-10-CM

## 2017-05-04 LAB — SURGICAL PATHOLOGY

## 2017-05-04 NOTE — Telephone Encounter (Signed)
Called pt's house and spoke to wife.  Let her know that we got a verbal from Dr. Luana Shu that it is not cancer, she will get stains and review them later today but she is sure there is no cancer. ? Sarcoidosis.  Dr. Janese Banks spoke to IR doctor-dr. Hoss and he felt like he could do lung bx.  Wanted to see if pt was agreeable and wife states they had already talked about that and he would be fine. They would like a call back later today to get final results and will await call in future when bx is set up.  I told her we will do it. Cancelled appt today since we spoke over phone. She is agreeable and will let pt know

## 2017-05-05 ENCOUNTER — Other Ambulatory Visit: Payer: Self-pay | Admitting: Oncology

## 2017-05-05 ENCOUNTER — Ambulatory Visit
Admission: RE | Admit: 2017-05-05 | Discharge: 2017-05-05 | Disposition: A | Discharge status: Home / Self Care | Payer: Self-pay | Source: Ambulatory Visit | Attending: Oncology | Admitting: Oncology

## 2017-05-05 DIAGNOSIS — M545 Low back pain: Secondary | ICD-10-CM

## 2017-05-07 ENCOUNTER — Other Ambulatory Visit: Payer: Self-pay | Admitting: *Deleted

## 2017-05-07 ENCOUNTER — Telehealth: Payer: Self-pay | Admitting: *Deleted

## 2017-05-07 NOTE — Telephone Encounter (Signed)
Spoke to patient via telephone and informed him about the CT guided biopsy of the lung that is scheduled for Monday, January 28th at 9:00. Patient is to report to the [facility] at 8:00. NPO after midnight the night before and only necessary meds with a sip of water the morning of the procedure. He also has to have a driver to take him home. He verbalized understanding of all the instructions.        dhs

## 2017-05-07 NOTE — Telephone Encounter (Signed)
Please reschedule his appointment with me on 2/7 or 2/8 since I am out of town

## 2017-05-08 ENCOUNTER — Other Ambulatory Visit: Payer: Self-pay | Admitting: Radiology

## 2017-05-08 ENCOUNTER — Other Ambulatory Visit: Payer: Self-pay | Admitting: General Surgery

## 2017-05-11 ENCOUNTER — Ambulatory Visit
Admission: RE | Admit: 2017-05-11 | Discharge: 2017-05-11 | Disposition: A | Discharge status: Home / Self Care | Payer: Medicare HMO | Source: Ambulatory Visit | Attending: Oncology | Admitting: Oncology

## 2017-05-11 DIAGNOSIS — R911 Solitary pulmonary nodule: Secondary | ICD-10-CM | POA: Diagnosis not present

## 2017-05-11 DIAGNOSIS — G4730 Sleep apnea, unspecified: Secondary | ICD-10-CM | POA: Diagnosis not present

## 2017-05-11 DIAGNOSIS — F329 Major depressive disorder, single episode, unspecified: Secondary | ICD-10-CM | POA: Diagnosis not present

## 2017-05-11 DIAGNOSIS — E119 Type 2 diabetes mellitus without complications: Secondary | ICD-10-CM | POA: Insufficient documentation

## 2017-05-11 DIAGNOSIS — G2581 Restless legs syndrome: Secondary | ICD-10-CM | POA: Insufficient documentation

## 2017-05-11 DIAGNOSIS — Z880 Allergy status to penicillin: Secondary | ICD-10-CM | POA: Diagnosis not present

## 2017-05-11 DIAGNOSIS — R918 Other nonspecific abnormal finding of lung field: Secondary | ICD-10-CM | POA: Insufficient documentation

## 2017-05-11 DIAGNOSIS — K219 Gastro-esophageal reflux disease without esophagitis: Secondary | ICD-10-CM | POA: Diagnosis not present

## 2017-05-11 DIAGNOSIS — M549 Dorsalgia, unspecified: Secondary | ICD-10-CM | POA: Diagnosis not present

## 2017-05-11 DIAGNOSIS — Z7982 Long term (current) use of aspirin: Secondary | ICD-10-CM | POA: Insufficient documentation

## 2017-05-11 DIAGNOSIS — Z7984 Long term (current) use of oral hypoglycemic drugs: Secondary | ICD-10-CM | POA: Insufficient documentation

## 2017-05-11 DIAGNOSIS — G8929 Other chronic pain: Secondary | ICD-10-CM | POA: Insufficient documentation

## 2017-05-11 DIAGNOSIS — Z79899 Other long term (current) drug therapy: Secondary | ICD-10-CM | POA: Insufficient documentation

## 2017-05-11 DIAGNOSIS — I10 Essential (primary) hypertension: Secondary | ICD-10-CM | POA: Insufficient documentation

## 2017-05-11 LAB — GLUCOSE, CAPILLARY: Glucose-Capillary: 113 mg/dL — ABNORMAL HIGH (ref 65–99)

## 2017-05-11 MED ORDER — FENTANYL CITRATE (PF) 100 MCG/2ML IJ SOLN
INTRAMUSCULAR | Status: AC
  Filled 2017-05-11: qty 4

## 2017-05-11 MED ORDER — MIDAZOLAM HCL 5 MG/5ML IJ SOLN
INTRAMUSCULAR | Status: AC
  Filled 2017-05-11: qty 5

## 2017-05-11 MED ORDER — SODIUM CHLORIDE 0.9 % IV SOLN
INTRAVENOUS | Status: DC
  Administered 2017-05-11: 09:00:00 via INTRAVENOUS

## 2017-05-11 NOTE — Consult Note (Signed)
Chief Complaint: Indeterminate hypermetabolic pulmonary nodules  Referring Physician(s): Rao,Archana C  Patient Status: [place] - Out-pt  History of Present Illness: George Daniels is a 72 y.o. male with past medical history significant for hypertension, diabetes, sleep apnea and chronic back pain who was found to have new osseous lesions on lumbar spine MRI performed 04/01/2017, however some of the lesions were present dating back to lumbar spine MRI performed in 2013, with subsequent PET/CT performed 04/21/2017 confirming the osseous lesions to be hypermetabolic as well as several hypermetabolic pulmonary nodules, the largest of which within the right lower lobe measuring 26.2 cm.  The patient is well-known to the interventional radiology service after undergoing a technically successful CT-guided biopsy of indeterminate lytic lesion involving the right side of the ilium performed by myself on 04/30/2017.   Pathology from that biopsy demonstrated noncaseating granulomatous suggestive of sarcoidosis, however given the presence of additional hypermetabolic pulmonary nodules, request made for CT-guided pulmonary nodule biopsy for additional diagnostic purposes.  The patient is again accompanied by his wife though serves as his own historian.  Patient continues to complain of back pain hours otherwise without complaint. Specifically, no chest pain or shortness of breath. No cough or hemoptysis. No unintentional weight loss or weight gain.  Past Medical History:  Diagnosis Date  . Arthritis    back  . Complication of anesthesia    nausea  . Depression   . Diabetes mellitus without complication ([place])   . Family history of adverse reaction to anesthesia    daughter nausea  . GERD (gastroesophageal reflux disease)   . Hypertension   . Nausea   . RLS (restless legs syndrome)   . Sleep apnea    does not use cpap    Past Surgical History:  Procedure Laterality Date  . ACHILLES TENDON  REPAIR Right 2014  . SHOULDER ARTHROSCOPY WITH OPEN ROTATOR CUFF REPAIR Right 08/02/2015   Procedure: SHOULDER ARTHROSCOPY WITH LIMITED DEBRIDEMENT,  MINI OPEN ROTATOR CUFF REPAIR, DECOMPRESSION;  Surgeon: Corky Mull, MD;  Location: [facility] ORS;  Service: Orthopedics;  Laterality: Right;  . TENNIS ELBOW RELEASE/NIRSCHEL PROCEDURE Right 10/02/2016   Procedure: TENNIS ELBOW / OPEN DEBRIDMENT OF THE COMMON EXTENSOR ORGIN OF RIGHT ELBOW;  Surgeon: Corky Mull, MD;  Location: [facility] ORS;  Service: Orthopedics;  Laterality: Right;  . TONSILLECTOMY      Allergies: Penicillins  Medications: Prior to Admission medications   Medication Sig Start Date End Date Taking? Authorizing Provider  amLODipine (NORVASC) 5 MG tablet Take 5 mg by mouth daily.    Yes [provider]  aspirin 81 MG tablet Take 81 mg by mouth every morning.   Yes [provider]  atorvastatin (LIPITOR) 10 MG tablet Take 10 mg by mouth every morning.   Yes [provider]  cetirizine (ZYRTEC) 10 MG tablet Take 10 mg by mouth at bedtime.   Yes [provider]  citalopram (CELEXA) 40 MG tablet Take 40 mg by mouth every morning.   Yes [provider]  clotrimazole (LOTRIMIN) 1 % cream Apply 1 application topically every other day.   Yes [provider]  gabapentin (NEURONTIN) 300 MG capsule Take 300 mg by mouth at bedtime.   Yes [provider]  hydrocortisone cream 1 % Apply 1 application topically every other day.   Yes [provider]  lisinopril-hydrochlorothiazide (PRINZIDE,ZESTORETIC) 20-25 MG tablet Take 1 tablet by mouth every morning.   Yes [provider]  metFORMIN (GLUCOPHAGE-XR) 500 MG 24  hr tablet Take 500 mg by mouth daily with supper.  05/14/16 05/14/17 Yes [provider]  Multiple Vitamin (MULTIVITAMIN WITH MINERALS) TABS tablet Take 1 tablet by mouth daily.   Yes [provider]  naproxen sodium (ALEVE) 220 MG tablet Take 220  mg by mouth as needed.    Yes [provider]  omeprazole (PRILOSEC) 40 MG capsule Take 40 mg by mouth every evening.   Yes [provider]  rOPINIRole (REQUIP) 1 MG tablet Take 1 mg by mouth at bedtime.  07/21/16  Yes [provider]  tiZANidine (ZANAFLEX) 2 MG tablet Take 2 mg by mouth every 6 (six) hours as needed for muscle spasms.   Yes [provider]  traZODone (DESYREL) 100 MG tablet Take 100 mg by mouth at bedtime.   Yes [provider]  ALPRAZolam Duanne Moron) 0.5 MG tablet Take 0.5 mg by mouth daily as needed for anxiety.  10/30/15   [provider]  Omega-3 Fatty Acids (FISH OIL) 1000 MG CAPS Take 1,000 mg by mouth daily.    [provider]     History reviewed. No pertinent family history.  Social History   Socioeconomic History  . Marital status: Married    Spouse name: None  . Number of children: None  . Years of education: None  . Highest education level: None  Social Needs  . Financial resource strain: None  . Food insecurity - worry: None  . Food insecurity - inability: None  . Transportation needs - medical: None  . Transportation needs - non-medical: None  Occupational History  . None  Tobacco Use  . Smoking status: Never Smoker  . Smokeless tobacco: Never Used  Substance and Sexual Activity  . Alcohol use: Yes    Alcohol/week: 0.0 - 0.6 oz    Comment: 1 bloody Mary every two weeks  . Drug use: No  . Sexual activity: None  Other Topics Concern  . None  Social History Narrative  . None    ECOG Status: 1 - Symptomatic but completely ambulatory  Review of Systems: A 12 point ROS discussed and pertinent positives are indicated in the HPI above.  All other systems are negative.  Review of Systems  Constitutional: Negative.  Negative for activity change, appetite change, fatigue and unexpected weight change.  Respiratory: Negative.  Negative for cough and shortness of breath.   Cardiovascular:  Negative.   Gastrointestinal: Negative.   Musculoskeletal: Positive for back pain.    Vital Signs: BP 125/62   Pulse 62   Temp 97.6 F (36.4 C) (Oral)   Resp 18   SpO2 99%   Physical Exam  Nursing note and vitals reviewed.   Imaging: Ct Chest W Contrast  Result Date: 04/13/2017 CLINICAL DATA:  Patient with suspicious osseous lesions on prior MRI concerning for the possibility of metastatic prostate cancer. EXAM: CT CHEST, ABDOMEN, AND PELVIS WITH CONTRAST TECHNIQUE: Multidetector CT imaging of the chest, abdomen and pelvis was performed following the standard protocol during bolus administration of intravenous contrast. CONTRAST:  199m ISOVUE-300 IOPAMIDOL (ISOVUE-300) INJECTION 61% COMPARISON:  MR lumbar spine 04/01/2017 FINDINGS: CT CHEST FINDINGS Cardiovascular: Normal heart size. No pericardial effusion. Thoracic aortic vascular calcifications. Aorta and main pulmonary artery are normal in caliber. Mediastinum/Nodes: Multiple prominent and mildly enlarged mediastinal lymph nodes including a 1.0 cm right peritracheal lymph node, a 1.2 cm prevascular lymph node (image 20; series 2) and a 1.3 cm subcarinal lymph node (image 26; series 2). Additional prominent subcentimeter  lymph nodes are demonstrated. Esophagus is normal in appearance. Lungs/Pleura: Central airways are patent. Within the right lower lobe there is a 6.4 x 3.2 cm irregular mass (image 109; series 4). There are multiple bilateral irregular pulmonary nodules demonstrated throughout the lungs bilaterally with a reference 1.3 cm nodule in the left upper lobe (image 36; series 4) and a 1.5 cm nodule within the right middle lobe (image 74; series 4). No pleural effusion or pneumothorax. Musculoskeletal: Multiple lytic lesions throughout the visualized thoracic spine including the T3, T4 and T7 vertebral bodies. CT ABDOMEN PELVIS FINDINGS Hepatobiliary: There is a 1.9 x 1.7 cm indeterminate low-attenuation lesion within the right  hepatic lobe (image 54; series 2). There is an additional 1.1 cm low-attenuation lesion within the hepatic dome (image 49; series 2). Additional subcentimeter low-attenuation lesions are demonstrated within the liver. Gallbladder is unremarkable. No intrahepatic or extrahepatic biliary ductal dilatation. Pancreas: Unremarkable Spleen:  Unremarkable Adrenals/Urinary Tract: The adrenal glands are normal. Kidneys enhance symmetrically with contrast. There is a 4.7 cm exophytic cyst off the interpolar region of the left kidney. There is a 4.0 cm cyst within the interpolar region of the right kidney and a 1.8 cm cyst within the inferior pole of the right kidney. No suspicious enhancing renal masses identified. No hydronephrosis. Urinary bladder is unremarkable. Stomach/Bowel: Normal morphology of the stomach. No abnormal bowel wall thickening or evidence for bowel obstruction. No free fluid or free intraperitoneal air. Vascular/Lymphatic: Normal caliber abdominal aorta. No retroperitoneal lymphadenopathy. Peripheral calcified atherosclerotic plaque. Reproductive: Prostate is heterogeneous. Other: Small fat containing left inguinal hernia. Musculoskeletal: Multiple lytic lesions are demonstrated throughout the visualized lumbar spine and pelvis including a 2.4 cm lesion within the left ilium (image 108; series 2) and a 2.4 cm lesion within the L4 vertebral body. IMPRESSION: 1. Large irregular mass within the right lower lobe which may represent primary pulmonary malignancy or metastatic disease. 2. Multiple small irregular pulmonary nodules throughout the lungs bilaterally concerning for metastatic disease. 3. Prominent and mildly enlarged mediastinal lymph nodes concerning for metastatic disease. 4. Indeterminate hepatic lesions raising the possibility of metastatic disease. Depending upon pathologic diagnosis and clinical treatment, consider further evaluation with MRI. 5. Multiple lytic lesions throughout the skeleton  compatible with osseous metastatic disease. Electronically Signed   By: Lovey Newcomer M.D.   On: 04/13/2017 13:22   Mr Jeri Cos SR Contrast  Result Date: 04/22/2017 CLINICAL DATA:  Lung mass.  Assessment for metastases. EXAM: MRI HEAD WITHOUT AND WITH CONTRAST TECHNIQUE: Multiplanar, multiecho pulse sequences of the brain and surrounding structures were obtained without and with intravenous contrast. CONTRAST:  39m MULTIHANCE GADOBENATE DIMEGLUMINE 529 MG/ML IV SOLN COMPARISON:  None. FINDINGS: Brain: The midline structures are normal. There is no acute infarct or acute hemorrhage. No mass lesion, hydrocephalus, dural abnormality or extra-axial collection. The brain parenchymal signal is normal. No age-advanced or lobar predominant atrophy. No chronic microhemorrhage or superficial siderosis. Vascular: Major intracranial arterial and venous sinus flow voids are preserved. Skull and upper cervical spine: The visualized skull base, calvarium, upper cervical spine and extracranial soft tissues are normal. Sinuses/Orbits: No fluid levels or advanced mucosal thickening. No mastoid or middle ear effusion. Normal orbits. IMPRESSION: Normal aging brain. No intracranial or calvarial metastatic disease. Electronically Signed   By: KUlyses JarredM.D.   On: 04/22/2017 06:27   Nm Bone Scan Whole Body  Result Date: 04/13/2017 CLINICAL DATA:  Abnormal MRI with question bone lesions, reportedly normal PSA EXAM: NUCLEAR MEDICINE WHOLE BODY  BONE SCAN TECHNIQUE: Whole body anterior and posterior images were obtained approximately 3 hours after intravenous injection of radiopharmaceutical. RADIOPHARMACEUTICALS:  22.70 mCi Technetium-21mMDP IV COMPARISON:  11/11/2010 Radiographic correlation: MRI lumbar spine 04/01/2017, CT chest abdomen pelvis 04/13/2017 FINDINGS: Multiple abnormal sites of increased osseous tracer accumulation are identified within the thoracic and lumbar spine. Additional increased abnormal tracer uptake is  seen at the RIGHT ilium at the inferior aspect of the RIGHT SI joint. No additional sites of concerning abnormal osseous tracer accumulation are seen. Uptake at the sternoclavicular and current clavicular joints typically degenerative. Expected urinary tract and soft tissue distribution of tracer. IMPRESSION: Multiple abnormal sites of increased osseous tracer accumulation within the thoracic spine, lumbar spine and RIGHT ilium adjacent SI joint compatible with osseous metastatic disease. These correspond areas of lucent lesions on CT. While multiple myeloma could cause the lytic lesions identified on CT, it typically demonstrates decreased rather than increased tracer accumulation on bone scintigraphy making osseous metastatic disease the primary consideration. Electronically Signed   By: MLavonia DanaM.D.   On: 04/13/2017 15:21   Ct Abdomen Pelvis W Contrast  Result Date: 04/13/2017 CLINICAL DATA:  Patient with suspicious osseous lesions on prior MRI concerning for the possibility of metastatic prostate cancer. EXAM: CT CHEST, ABDOMEN, AND PELVIS WITH CONTRAST TECHNIQUE: Multidetector CT imaging of the chest, abdomen and pelvis was performed following the standard protocol during bolus administration of intravenous contrast. CONTRAST:  1014mISOVUE-300 IOPAMIDOL (ISOVUE-300) INJECTION 61% COMPARISON:  MR lumbar spine 04/01/2017 FINDINGS: CT CHEST FINDINGS Cardiovascular: Normal heart size. No pericardial effusion. Thoracic aortic vascular calcifications. Aorta and main pulmonary artery are normal in caliber. Mediastinum/Nodes: Multiple prominent and mildly enlarged mediastinal lymph nodes including a 1.0 cm right peritracheal lymph node, a 1.2 cm prevascular lymph node (image 20; series 2) and a 1.3 cm subcarinal lymph node (image 26; series 2). Additional prominent subcentimeter lymph nodes are demonstrated. Esophagus is normal in appearance. Lungs/Pleura: Central airways are patent. Within the right lower  lobe there is a 6.4 x 3.2 cm irregular mass (image 109; series 4). There are multiple bilateral irregular pulmonary nodules demonstrated throughout the lungs bilaterally with a reference 1.3 cm nodule in the left upper lobe (image 36; series 4) and a 1.5 cm nodule within the right middle lobe (image 74; series 4). No pleural effusion or pneumothorax. Musculoskeletal: Multiple lytic lesions throughout the visualized thoracic spine including the T3, T4 and T7 vertebral bodies. CT ABDOMEN PELVIS FINDINGS Hepatobiliary: There is a 1.9 x 1.7 cm indeterminate low-attenuation lesion within the right hepatic lobe (image 54; series 2). There is an additional 1.1 cm low-attenuation lesion within the hepatic dome (image 49; series 2). Additional subcentimeter low-attenuation lesions are demonstrated within the liver. Gallbladder is unremarkable. No intrahepatic or extrahepatic biliary ductal dilatation. Pancreas: Unremarkable Spleen:  Unremarkable Adrenals/Urinary Tract: The adrenal glands are normal. Kidneys enhance symmetrically with contrast. There is a 4.7 cm exophytic cyst off the interpolar region of the left kidney. There is a 4.0 cm cyst within the interpolar region of the right kidney and a 1.8 cm cyst within the inferior pole of the right kidney. No suspicious enhancing renal masses identified. No hydronephrosis. Urinary bladder is unremarkable. Stomach/Bowel: Normal morphology of the stomach. No abnormal bowel wall thickening or evidence for bowel obstruction. No free fluid or free intraperitoneal air. Vascular/Lymphatic: Normal caliber abdominal aorta. No retroperitoneal lymphadenopathy. Peripheral calcified atherosclerotic plaque. Reproductive: Prostate is heterogeneous. Other: Small fat containing left inguinal hernia. Musculoskeletal: Multiple lytic  lesions are demonstrated throughout the visualized lumbar spine and pelvis including a 2.4 cm lesion within the left ilium (image 108; series 2) and a 2.4 cm lesion  within the L4 vertebral body. IMPRESSION: 1. Large irregular mass within the right lower lobe which may represent primary pulmonary malignancy or metastatic disease. 2. Multiple small irregular pulmonary nodules throughout the lungs bilaterally concerning for metastatic disease. 3. Prominent and mildly enlarged mediastinal lymph nodes concerning for metastatic disease. 4. Indeterminate hepatic lesions raising the possibility of metastatic disease. Depending upon pathologic diagnosis and clinical treatment, consider further evaluation with MRI. 5. Multiple lytic lesions throughout the skeleton compatible with osseous metastatic disease. Electronically Signed   By: Lovey Newcomer M.D.   On: 04/13/2017 13:22   Nm Pet Image Initial (pi) Skull Base To Thigh  Result Date: 04/21/2017 CLINICAL DATA:  Initial treatment strategy for lung mass with osseous and possible hepatic metastases. History of diabetes. EXAM: NUCLEAR MEDICINE PET SKULL BASE TO THIGH TECHNIQUE: 12.52 mCi F-18 FDG was injected intravenously. Full-ring PET imaging was performed from the skull base to thigh after the radiotracer. CT data was obtained and used for attenuation correction and anatomic localization. FASTING BLOOD GLUCOSE:  Value: 104 mg/dl COMPARISON:  CTs and whole body bone scan 04/13/2017. FINDINGS: NECK No hypermetabolic cervical lymph nodes are identified.There are no lesions of the pharyngeal mucosal space. CHEST There are mildly hypermetabolic small mediastinal and hilar lymph nodes bilaterally. 11 mm AP window node on image 94 has an SUV max of 6.1. Left hilar node has an SUV max of 5.1. There are multiple hypermetabolic pulmonary nodules bilaterally. The dominant spiculated lesion posteriorly in the right lower lobe measures approximately 4.2 x 1.8 cm on image 121 and has an SUV max of 5.2. This may reflect primary bronchogenic carcinoma. Multiple other nodules are similar to the recent chest CT. No significant vascular findings or  significant pleural effusion. ABDOMEN/PELVIS There is no hypermetabolic activity within the liver, adrenal glands, spleen or pancreas. The previously demonstrated focal hepatic lesions are not well seen on this noncontrast study but do not show any focal hypermetabolic activity. There is underlying generalized hepatic steatosis. There are no hypermetabolic abdominopelvic lymph nodes. Bilateral renal cysts and aortoiliac atherosclerosis are noted. SKELETON There are multiple hypermetabolic osseous metastases throughout the axial and proximal appendicular skeleton. Representative lesions include a lytic T9 lesion with an SUV max of 7.5 and a left acetabular lesion with an SUV max of 8.2. Lesions are present within the right scapula, the left humeral neck, multiple ribs, the bony pelvis and both proximal femurs. No pathologic fracture identified. Hypermetabolic activity associated with the left common hamstring tendon consistent with tendinosis. IMPRESSION: 1. Multiple hypermetabolic pulmonary nodules bilaterally, including a dominant spiculated lesion in the right lower lobe, likely reflecting primary bronchogenic carcinoma. 2. Associated hypermetabolic mediastinal and bilateral hilar lymph nodes consistent with nodal metastases. 3. Widespread osseous metastatic disease. 4. No definite extra osseous tumor outside of the chest. Electronically Signed   By: Richardean Sale M.D.   On: 04/21/2017 12:33   Ct Biopsy  Result Date: 04/30/2017 INDICATION: No known primary, now with multiple hypermetabolic osseous lesions worrisome for metastatic disease. Please perform CT-guided biopsy for tissue diagnostic purposes. EXAM: CT-GUIDED BONE LESION BIOPSY MEDICATIONS: None ANESTHESIA/SEDATION: Fentanyl 75 mcg IV; Versed 3 mg IV Sedation Time: 16 Minutes; The patient was continuously monitored during the procedure by the interventional radiology nurse under my direct supervision. COMPLICATIONS: None immediate. PROCEDURE:  Informed consent was obtained from  the patient following an explanation of the procedure, risks, benefits and alternatives. The patient understands, agrees and consents for the procedure. All questions were addressed. A time out was performed prior to the initiation of the procedure. The patient was positioned prone and non-contrast localization CT was performed of the pelvis to demonstrate the known approximately 2.9 x 2.0 cm hypermetabolic lytic lesion involving the posterior aspect the right ilium (16, series 2). The operative site was prepped and draped in the usual sterile fashion. Under sterile conditions and local anesthesia, a 22 gauge spinal needle was utilized for procedural planning. Next, an 11 gauge coaxial bone biopsy needle was advanced into peripheral aspect of the hypermetabolic lytic lesion. Needle position was confirmed with CT imaging. Initial sample was acquired with the inner 13 gauge trocar biopsy device yielding predominantly clot. Next, the outer 11 gauge coaxial bone biopsy needle was utilized to acquire a visually excellent sample. As such, the 11 gauge coaxial bone needle biopsy device was re-advanced into the peripheral aspect of the lytic lesion. Appropriate positioned was confirmed and an additional visually excellent sample was obtained. The needle was removed intact. Hemostasis was obtained with compression and a dressing was placed. The patient tolerated the procedure well without immediate post procedural complication. IMPRESSION: Technically successful CT guided biopsy of indeterminate lytic lesion involving the posterior aspect of the right ilium. Electronically Signed   By: Sandi Mariscal M.D.   On: 04/30/2017 11:58    Labs:  CBC: Recent Labs    09/26/16 1325 04/03/17 1231 04/30/17 0810  WBC 4.9 6.1 4.6  HGB 14.7 14.9 14.4  HCT 42.4 43.1 42.0  PLT 152 183 158    COAGS: Recent Labs    04/30/17 0810  INR 1.05  APTT 30    BMP: Recent Labs    09/26/16 1325  04/03/17 1231  NA  --  134*  K 3.9 3.8  CL  --  95*  CO2  --  30  GLUCOSE  --  114*  BUN  --  14  CALCIUM  --  9.4  CREATININE  --  0.96  GFRNONAA  --  >60  GFRAA  --  >60    LIVER FUNCTION TESTS: Recent Labs    04/03/17 1231  BILITOT 1.1  AST 24  ALT 26  ALKPHOS 97  PROT 7.4  ALBUMIN 4.2    TUMOR MARKERS: No results for input(s): AFPTM, CEA, CA199, CHROMGRNA in the last 8760 hours.  Assessment and Plan:  George Daniels is a 72 y.o. male with past medical history significant for hypertension, diabetes, sleep apnea and chronic back pain who was found to have new osseous lesions on lumbar spine MRI performed 04/01/2017, however some of the lesions were present dating back to lumbar spine MRI performed in 2013, with subsequent PET/CT performed 04/21/2017 confirming the osseous lesions to be hypermetabolic as well as several hypermetabolic pulmonary nodules, the largest of which within the right lower lobe measuring 26.2 cm.  The patient is well-known to the interventional radiology service after undergoing a technically successful CT-guided biopsy of indeterminate lytic lesion involving the right side of the ilium performed by myself on 04/30/2017.  Pathology from that biopsy demonstrated noncaseating granulomatous suggestive of sarcoidosis, however given the presence of additional hypermetabolic pulmonary nodules, request made for CT-guided pulmonary nodule biopsy for additional diagnostic purposes.  PET/CT performed 04/13/2017 was reviewed in detail with only accessible site for potential percutaneous biopsy being the bandlike right lower lobe mass which is noted  to contain a central internal calcification and looks atypical for either a primary or metastatic lesion.  Pathology result was discussed with interpreting pathologist, Dr. Luana Shu, who states the acquired bone lesion biopsy could be seen in the setting of diffuse sarcoidosis however sarcoidosis is a diagnosis of exclusion and  must be correlated clinically, however pulmonary lesions are often seen in the setting of diffuse sarcoidosis.  Case was also discussed with referring oncologist, Dr. Janese Banks, and the decision was made to offer the following treatment options to the patient:  #1 - Formal referral to pulmonary medicine for evaluation of potential diffuse sarcoidosis. Based on the results of the osseous lesion biopsy, my assumption is that pulmonary would either proceed with attempted bronchoscopic biopsy of one of the hypermetabolic mediastinal lymph nodes versus empirically treating the patient for the presumed diagnosis of diffuse sarcoid and obtain short-term imaging follow-up to evaluate for response.  #2 - Proceeding with CT-guided biopsy to establish a definitive diagnosis within the dominant right lower lobe opacity/mass.  Risks and benefits of CT-guided lung biopsy were discussed with the patient including, but not limited to bleeding, hemoptysis, respiratory failure requiring intubation, infection, pneumothorax requiring chest tube placement, stroke from air embolism or even death.  All of the patient's questions were answered.  Following this prolonged and detailed conversation, the patient and the patient's wife, wish to pursue further evaluation by the primary medicine service prior to proceeding with CT-guided pulmonary nodule biopsy.  Thank you for this interesting consult.  I greatly enjoyed meeting George Daniels and look forward to participating in their care.  A copy of this report was sent to the requesting provider on this date.  Electronically Signed: Sandi Mariscal, MD 05/11/2017, 9:29 AM   I spent a total of 25 Minutes in face to face in clinical consultation, greater than 50% of which was counseling/coordinating care for evaluation of hypermetabolic primary nodules

## 2017-05-11 NOTE — Progress Notes (Signed)
Dr. Pascal Lux spoke extensively re: risks & benefits of a lung biopsy. Dr. Pascal Lux then spoke with Dr. Janese Banks via phone. Uponreturn, Dr. Pascal Lux again spoke with pt. & wife & all parties involved  decidedto wait on lung biopsy . Pt.To be seen by pulmonary for followup at this point.NS lock removed without complications at site. Pt. In no acute distress.

## 2017-05-13 ENCOUNTER — Other Ambulatory Visit: Payer: Self-pay | Admitting: *Deleted

## 2017-05-13 DIAGNOSIS — D869 Sarcoidosis, unspecified: Secondary | ICD-10-CM

## 2017-05-13 NOTE — Progress Notes (Signed)
Ordered ACE level per DR.

## 2017-05-15 ENCOUNTER — Other Ambulatory Visit
Admission: RE | Admit: 2017-05-15 | Discharge: 2017-05-15 | Disposition: A | Discharge status: Home / Self Care | Payer: Medicare HMO | Source: Ambulatory Visit | Attending: Internal Medicine | Admitting: Internal Medicine

## 2017-05-15 DIAGNOSIS — D869 Sarcoidosis, unspecified: Secondary | ICD-10-CM | POA: Insufficient documentation

## 2017-05-16 LAB — ANGIOTENSIN CONVERTING ENZYME: Angiotensin-Converting Enzyme: <15 U/L (ref 14–82)

## 2017-05-18 DIAGNOSIS — G2581 Restless legs syndrome: Secondary | ICD-10-CM | POA: Diagnosis not present

## 2017-05-20 ENCOUNTER — Encounter: Payer: Self-pay | Admitting: Internal Medicine

## 2017-05-20 ENCOUNTER — Ambulatory Visit: Payer: Medicare HMO | Admitting: Internal Medicine

## 2017-05-20 VITALS — BP 126/80 | HR 92 | Resp 16 | Ht 72.0 in | Wt 232.0 lb

## 2017-05-20 DIAGNOSIS — IMO0001 Reserved for inherently not codable concepts without codable children: Secondary | ICD-10-CM

## 2017-05-20 DIAGNOSIS — D869 Sarcoidosis, unspecified: Secondary | ICD-10-CM | POA: Diagnosis not present

## 2017-05-20 DIAGNOSIS — R911 Solitary pulmonary nodule: Secondary | ICD-10-CM

## 2017-05-20 MED ORDER — PREDNISONE 20 MG PO TABS: ORAL_TABLET | ORAL | 1 refills | Status: DC

## 2017-05-20 NOTE — Progress Notes (Signed)
George Daniels      Assessment and Plan:  Sarcoidosis, Lung nodule with paratracheal and hilar lymphadenopathy. -Right lower lobe nodule with scattered mediastinal lymphadenopathy, multiple bilateral lung nodules, right lower lobe nodule does not appear typical of lung cancer, in addition he is numerous spinal disease lesions, one was biopsied, and was positive for noncaseating granulomata. - We will start empiric course of prednisone. -ACE level was negative, however patient is Daniels a ACE inhibitor at baseline. -Recheck CT of the chest after 4-6-week course of prednisone.  --Pt has baseline ECG, calcium normal, will need referred for baseline eye exam.   Meds ordered this encounter  Medications  . predniSONE (DELTASONE) 20 MG tablet    Sig: Take 2 tablets together daily for one week, then,  Take 1 tablet daily until seen by physician again.  Do not stop suddenly without consulting physician.    Dispense:  60 tablet    Refill:  1    Orders Placed This Encounter  Procedures  . CT CHEST W CONTRAST   Return in about 6 weeks (around 07/01/2017).    Date: 05/20/2017  MRN# [id number] George Daniels Jun 19, 1945   George Daniels is a 72 y.o. old male seen in Daniels for chief complaint of:    Chief Complaint  Patient presents with  . Follow-up    f/u biospy  . Lung Lesion    HPI:  Patient was noted to have back pain, an MRI showed progressive bone lesions, which have been present since 2013.  He subsequently had a PET scan which showed increased uptake in bone lesions suggestive of metastatic disease.  He then underwent CT guided biopsy of indeterminate lytic lesion involving the posterior aspect of the right ilium, which showed granulomatous disease suggestive of sarcoidosis. The patient was also noted to have a right lung band like nodularity with positive uptake Daniels PET scan, patient was then referred to interventional radiology for a CT-guided  biopsy.  However during that Daniels was decided that this would be deferred and the patient will be referred back to pulmonary for further treatment recommendations.  The patient has never been a smoker, he has some second hand smoke exposure from his father who was a smoker. His father had lung cancer.  Patient worked as a Building services engineer in an office. Never lived or worked Daniels a farm. 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  No recent weight loss.  No travel outside the country.  Denies fevers.  Denies dyspnea cough or other respiratory symptoms.  He continues have chronic back pain of 4-5/10. It has progressed from 10 years ago.     Images personally reviewed 04/21/17; increased SUV, particularly in the RLL nodule and left paratracheal node.  PET scan radiologist report iMPRESSION: 1. Multiple hypermetabolic pulmonary nodules bilaterally, including a dominant spiculated lesion in the right lower lobe, likely reflecting primary bronchogenic carcinoma. 2. Associated hypermetabolic mediastinal and bilateral hilar lymph nodes consistent with nodal metastases. 3. Widespread osseous metastatic disease. 4. No definite extra osseous tumor outside of the chest.  Medication:    Current Outpatient Medications:  .  ALPRAZolam (XANAX) 0.5 MG tablet, Take 0.5 mg by mouth daily as needed for anxiety. , Disp: , Rfl:  .  amLODipine (NORVASC) 5 MG tablet, Take 5 mg by mouth daily. , Disp: , Rfl:  .  aspirin 81 MG tablet, Take 81 mg by mouth every morning., Disp: , Rfl:  .  atorvastatin (LIPITOR) 10 MG tablet, Take 10 mg by mouth every morning., Disp: , Rfl:  .  cetirizine (ZYRTEC) 10 MG tablet, Take 10 mg by mouth at bedtime., Disp: , Rfl:  .  citalopram (CELEXA) 40 MG tablet, Take 40 mg by mouth every morning., Disp: , Rfl:  .  clotrimazole (LOTRIMIN) 1 % cream, Apply 1 application topically every other day., Disp: , Rfl:  .  gabapentin (NEURONTIN) 300 MG capsule, Take  300 mg by mouth at bedtime., Disp: , Rfl:  .  hydrocortisone cream 1 %, Apply 1 application topically every other day., Disp: , Rfl:  .  lisinopril-hydrochlorothiazide (PRINZIDE,ZESTORETIC) 20-25 MG tablet, Take 1 tablet by mouth every morning., Disp: , Rfl:  .  metFORMIN (GLUCOPHAGE-XR) 500 MG 24 hr tablet, Take 500 mg by mouth daily with supper. , Disp: , Rfl:  .  Multiple Vitamin (MULTIVITAMIN WITH MINERALS) TABS tablet, Take 1 tablet by mouth daily., Disp: , Rfl:  .  naproxen sodium (ALEVE) 220 MG tablet, Take 220 mg by mouth as needed. , Disp: , Rfl:  .  Omega-3 Fatty Acids (FISH OIL) 1000 MG CAPS, Take 1,000 mg by mouth daily., Disp: , Rfl:  .  omeprazole (PRILOSEC) 40 MG capsule, Take 40 mg by mouth every evening., Disp: , Rfl:  .  rOPINIRole (REQUIP) 1 MG tablet, Take 1 mg by mouth at bedtime. , Disp: , Rfl:  .  tiZANidine (ZANAFLEX) 2 MG tablet, Take 2 mg by mouth every 6 (six) hours as needed for muscle spasms., Disp: , Rfl:  .  traZODone (DESYREL) 100 MG tablet, Take 100 mg by mouth at bedtime., Disp: , Rfl:    Allergies:  Penicillins  Review of Systems: Gen:  Denies  fever, sweats, chills HEENT: Denies blurred vision, double vision. bleeds, sore throat Cvc:  No dizziness, chest pain. Resp:   Denies cough or sputum production, shortness of breath Gi: Denies swallowing difficulty, stomach pain. Gu:  Denies bladder incontinence, burning urine Ext:   No Joint pain, stiffness. Skin: No skin rash,  hives  Endoc:  No polyuria, polydipsia. Psych: No depression, insomnia. Other:  All other systems were reviewed with the patient and were negative other that what is mentioned in the HPI.   Physical Examination:   VS: BP 126/80 (BP Location: Left Arm, Cuff Size: Large)   Pulse 92   Resp 16   Ht 6' (1.829 m)   Wt 232 lb (105.2 kg)   SpO2 97%   BMI 31.46 kg/m   General Appearance: No distress  Neuro:without focal findings,  speech normal,  HEENT: PERRLA, EOM intact.     Pulmonary: normal breath sounds, No wheezing.  CardiovascularNormal S1,S2.  No m/r/g.   Abdomen: Benign, Soft, non-tender. Renal:  No costovertebral tenderness  GU:  No performed at this time. Endoc: No evident thyromegaly, no signs of acromegaly. Skin:   warm, no rashes, no ecchymosis  Extremities: normal, no cyanosis, clubbing.  Other findings:    LABORATORY PANEL:   CBC No results for input(s): WBC, HGB, HCT, PLT in the last 168 hours. ------------------------------------------------------------------------------------------------------------------  Chemistries  No results for input(s): NA, K, CL, CO2, GLUCOSE, BUN, CREATININE, CALCIUM, MG, AST, ALT, ALKPHOS, BILITOT in the last 168 hours.  Invalid input(s): GFRCGP ------------------------------------------------------------------------------------------------------------------  Cardiac Enzymes No results for input(s): TROPONINI in the last 168 hours. ------------------------------------------------------------  RADIOLOGY:  No results found.     Thank  you for the [facility] and for allowing [facility] to assist  in the care of your patient. Our recommendations are noted above.  Please contact us if we can be of further service.   Marda Stalker, MD.  Board Certified in Internal Medicine, Pulmonary Medicine, [place], and Sleep Medicine.  [facility] Office Number: [phone]  Patricia Pesa, M.D.  Merton Border, M.D  05/20/2017

## 2017-05-20 NOTE — Patient Instructions (Addendum)
Will treat with prednisone.  Take 40 mg daily for one week, then 20 mg daily follow up.  Do not stop suddenly as you could develop withdrawal symptoms. Withdrawal includes symptoms fatigue, dizziness. If this happens, call the office.   What is sarcoidosis? Sarcoidosis is a disorder that causes clusters of abnormal tissue to form in the body. These clusters are called "granulomas." If many granulomas form in an organ, they can keep the organ from working normally. For example, granulomas can form in the lungs and cause breathing problems.  Sarcoidosis can affect many different organs. Often it affects the lungs, but it can also affect the skin, eyes, nose, and lots of other body parts.  What are the symptoms of sarcoidosis? Sarcoidosis causes different symptoms depending on which body part it affects. Often the symptoms are mild and go away on their own.  When it affects the lungs, sarcoidosis can cause:  ?Cough  ?Trouble breathing  ?Chest pain  ?Tiredness or weakness  ?Fever  ?Weight loss  When it affects the skin, sarcoidosis can cause a mild rash or painful bumps. Sometimes the rash and bumps go away completely. Other times, they leave a scar.  Sometimes, sarcoidosis causes no symptoms even though it is damaging certain organs. For this reason, people with sarcoidosis might need to have tests to check for organ damage.  How do I know if I have sarcoidosis? There is no single "test" that can tell if you have sarcoidosis. To diagnose it, doctors and nurses look at:  ?Your symptoms and your physical exam  ?X-rays (or special X-rays called CT scans)  ?Lab work on tissue taken from your body (called a biopsy)  ?Tests that can rule out other causes of your condition  How is sarcoidosis treated? Doctors and nurses do not know what causes sarcoidosis, so they do not have a treatment to cure it. Luckily, they do have ways to treat its symptoms. Even so, if your symptoms are mild,  you might not need treatment.  Medicines called steroids can relieve the symptoms of sarcoidosis and prevent some of the damage it can cause. These steroid medicines are not the kind that athletes take to build up muscle. These steroids reduce swelling and shrink the granulomas caused by sarcoidosis. Sometimes, people can use skin creams, eye drops, and inhalers that have steroids in them. Other times, if their symptoms are severe, they take steroid pills.  Even though steroid pills can help with the problems caused by sarcoidosis, they can also cause problems of their own. For instance, steroids can cause weight gain and mood swings, and make diabetes worse. For this reason, doctors and nurses try to take people off steroids as soon as possible.  Medicines besides steroids can also help with sarcoidosis. Doctors and nurses usually use these medicines only in people who cannot take steroids or who do not get better with steroids.  How will sarcoidosis affect my life? Sarcoidosis usually goes away on its own or does not get worse. Sometimes it does get worse and can spread to many organs. Even so, people do not usually die from sarcoidosis.

## 2017-05-22 DIAGNOSIS — I10 Essential (primary) hypertension: Secondary | ICD-10-CM | POA: Diagnosis not present

## 2017-05-22 DIAGNOSIS — F325 Major depressive disorder, single episode, in full remission: Secondary | ICD-10-CM | POA: Diagnosis not present

## 2017-05-22 DIAGNOSIS — E7800 Pure hypercholesterolemia, unspecified: Secondary | ICD-10-CM | POA: Diagnosis not present

## 2017-05-22 DIAGNOSIS — E119 Type 2 diabetes mellitus without complications: Secondary | ICD-10-CM | POA: Diagnosis not present

## 2017-05-25 DIAGNOSIS — E119 Type 2 diabetes mellitus without complications: Secondary | ICD-10-CM | POA: Diagnosis not present

## 2017-05-25 DIAGNOSIS — E7800 Pure hypercholesterolemia, unspecified: Secondary | ICD-10-CM | POA: Diagnosis not present

## 2017-05-25 DIAGNOSIS — F325 Major depressive disorder, single episode, in full remission: Secondary | ICD-10-CM | POA: Diagnosis not present

## 2017-05-25 DIAGNOSIS — I10 Essential (primary) hypertension: Secondary | ICD-10-CM | POA: Diagnosis not present

## 2017-05-25 DIAGNOSIS — D869 Sarcoidosis, unspecified: Secondary | ICD-10-CM | POA: Diagnosis not present

## 2017-06-01 DIAGNOSIS — M5136 Other intervertebral disc degeneration, lumbar region: Secondary | ICD-10-CM | POA: Diagnosis not present

## 2017-06-01 DIAGNOSIS — M5416 Radiculopathy, lumbar region: Secondary | ICD-10-CM | POA: Diagnosis not present

## 2017-06-01 DIAGNOSIS — M4726 Other spondylosis with radiculopathy, lumbar region: Secondary | ICD-10-CM | POA: Diagnosis not present

## 2017-06-01 DIAGNOSIS — M48062 Spinal stenosis, lumbar region with neurogenic claudication: Secondary | ICD-10-CM | POA: Diagnosis not present

## 2017-06-09 DIAGNOSIS — M48062 Spinal stenosis, lumbar region with neurogenic claudication: Secondary | ICD-10-CM | POA: Diagnosis not present

## 2017-06-09 DIAGNOSIS — M5136 Other intervertebral disc degeneration, lumbar region: Secondary | ICD-10-CM | POA: Diagnosis not present

## 2017-06-09 DIAGNOSIS — M5416 Radiculopathy, lumbar region: Secondary | ICD-10-CM | POA: Diagnosis not present

## 2017-06-16 DIAGNOSIS — M5442 Lumbago with sciatica, left side: Secondary | ICD-10-CM | POA: Diagnosis not present

## 2017-06-16 DIAGNOSIS — M48062 Spinal stenosis, lumbar region with neurogenic claudication: Secondary | ICD-10-CM | POA: Diagnosis not present

## 2017-06-16 DIAGNOSIS — G8929 Other chronic pain: Secondary | ICD-10-CM | POA: Diagnosis not present

## 2017-06-16 DIAGNOSIS — M5416 Radiculopathy, lumbar region: Secondary | ICD-10-CM | POA: Diagnosis not present

## 2017-06-22 ENCOUNTER — Other Ambulatory Visit
Admission: RE | Admit: 2017-06-22 | Discharge: 2017-06-22 | Disposition: A | Discharge status: Home / Self Care | Payer: Medicare HMO | Source: Ambulatory Visit | Attending: Internal Medicine | Admitting: Internal Medicine

## 2017-06-22 DIAGNOSIS — D869 Sarcoidosis, unspecified: Secondary | ICD-10-CM | POA: Diagnosis not present

## 2017-06-22 NOTE — Addendum Note (Signed)
Addended by: Santiago Bur on: 06/22/2017 10:47 AM   Modules accepted: Orders

## 2017-06-23 ENCOUNTER — Telehealth: Payer: Self-pay | Admitting: Internal Medicine

## 2017-06-23 DIAGNOSIS — Z0100 Encounter for examination of eyes and vision without abnormal findings: Secondary | ICD-10-CM | POA: Diagnosis not present

## 2017-06-23 LAB — ANGIOTENSIN CONVERTING ENZYME: Angiotensin-Converting Enzyme: <15 U/L (ref 14–82)

## 2017-06-23 NOTE — Telephone Encounter (Signed)
Pt states he is on Prednisone and needs advise if he needs to stop this gradually. States he has a 2 weeks supply left. Please call and advise.

## 2017-06-23 NOTE — Telephone Encounter (Signed)
Patient made aware that plan is to stay on Prednisone until next follow up on 3/20. Nothing further needed.

## 2017-06-24 DIAGNOSIS — M48062 Spinal stenosis, lumbar region with neurogenic claudication: Secondary | ICD-10-CM | POA: Diagnosis not present

## 2017-06-29 ENCOUNTER — Ambulatory Visit
Admission: RE | Admit: 2017-06-29 | Discharge: 2017-06-29 | Disposition: A | Discharge status: Home / Self Care | Payer: Medicare HMO | Source: Ambulatory Visit | Attending: Internal Medicine | Admitting: Internal Medicine

## 2017-06-29 DIAGNOSIS — I2510 Atherosclerotic heart disease of native coronary artery without angina pectoris: Secondary | ICD-10-CM | POA: Diagnosis not present

## 2017-06-29 DIAGNOSIS — I700 Atherosclerosis of aorta: Secondary | ICD-10-CM | POA: Diagnosis not present

## 2017-06-29 DIAGNOSIS — R911 Solitary pulmonary nodule: Secondary | ICD-10-CM | POA: Diagnosis present

## 2017-06-29 DIAGNOSIS — R918 Other nonspecific abnormal finding of lung field: Secondary | ICD-10-CM | POA: Insufficient documentation

## 2017-06-29 MED ORDER — IOPAMIDOL (ISOVUE-300) INJECTION 61%
75.0000 mL | Freq: Once | INTRAVENOUS | Status: AC | PRN
  Administered 2017-06-29: 75 mL via INTRAVENOUS

## 2017-07-01 ENCOUNTER — Ambulatory Visit: Payer: Medicare HMO | Admitting: Internal Medicine

## 2017-07-01 ENCOUNTER — Other Ambulatory Visit
Admission: RE | Admit: 2017-07-01 | Discharge: 2017-07-01 | Disposition: A | Discharge status: Home / Self Care | Payer: Medicare HMO | Source: Ambulatory Visit | Attending: Internal Medicine | Admitting: Internal Medicine

## 2017-07-01 ENCOUNTER — Encounter: Payer: Self-pay | Admitting: Internal Medicine

## 2017-07-01 VITALS — BP 118/66 | HR 68 | Resp 16 | Ht 72.0 in | Wt 230.0 lb

## 2017-07-01 DIAGNOSIS — D869 Sarcoidosis, unspecified: Secondary | ICD-10-CM

## 2017-07-01 DIAGNOSIS — R918 Other nonspecific abnormal finding of lung field: Secondary | ICD-10-CM | POA: Diagnosis not present

## 2017-07-01 MED ORDER — PREDNISONE 10 MG PO TABS: 10.0000 mg | ORAL_TABLET | Freq: Every day | ORAL | 1 refills | Status: DC

## 2017-07-01 NOTE — Progress Notes (Signed)
[facility] Consultation      Assessment and Plan:  Sarcoidosis, Lung nodule with paratracheal and hilar lymphadenopathy. -Right lower lobe nodule with scattered mediastinal lymphadenopathy, multiple bilateral lung nodules, right lower lobe nodule does not appear typical of lung cancer, in addition he is numerous spinal disease lesions, one was biopsied, and was positive for noncaseating granulomata. -Continue prednisone 20 mg daily. -Check TB QuantiFERON, if negative will start methotrexate, and titrate up over the next few weeks.  This can be paired with decrease in dose of prednisone. -Patient is asked to see an ophthalmologist for an eye exam. -Recheck CT of the chest after 3 months. -Recheck of metabolic panel and liver functions in 3 months. --Pt has baseline ECG, calcium normal.  Subcutaneous lump in lateral side of neck on right side. - Uncertain etiology, appears to hard to be a lipoma.  This may be a enlarged lymph node, and may be related to sarcoidosis, but uncertain. -Patient is planning on seeing ENT, and is encouraged to do so.  Meds ordered this encounter  Medications  . predniSONE (DELTASONE) 10 MG tablet    Sig: Take 1 tablet (10 mg total) by mouth daily with breakfast.    Dispense:  180 tablet    Refill:  1    Orders Placed This Encounter  Procedures  . CT CHEST WO CONTRAST  . QuantiFERON-TB Gold Plus  . Comp Met (CMET)   Return in about 3 months (around 10/01/2017).    Date: 07/01/2017  MRN# [id number] George Daniels 07-05-1945   George Daniels is a 72 y.o. old male seen in consultation for chief complaint of:    Chief Complaint  Patient presents with  . Sarcoidosis    pt here for follow up CT scan.    HPI:  Patient was noted to have back pain, an MRI showed progressive bone lesions, which have been present since 2013.  He subsequently had a PET scan which showed increased uptake in bone lesions suggestive of metastatic disease.    He then underwent CT guided biopsy of indeterminate lytic lesion involving the posterior aspect of the right ilium, which showed granulomatous disease suggestive of sarcoidosis.  He has remained on prednisone and has remained asymptomatic.   The patient has never been a smoker, he has some second hand smoke exposure from his father who was a smoker. His father had lung cancer.  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  No recent weight loss.  No travel outside the country.  Denies fevers.  Denies dyspnea cough or other respiratory symptoms.  He continues have chronic back pain of 4-5/10. It has progressed from 10 years ago.   Images personally reviewed chest 06/29/17; multiple bilateral nodules show slight reduction in size, there is also mild internal calcification in the masslike opacity in the right lower lobe and mediastinal lymphadenopathy.    PET scan radiologist report iMPRESSION: 1. Multiple hypermetabolic pulmonary nodules bilaterally, including a dominant spiculated lesion in the right lower lobe, likely reflecting primary bronchogenic carcinoma. 2. Associated hypermetabolic mediastinal and bilateral hilar lymph nodes consistent with nodal metastases. 3. Widespread osseous metastatic disease. 4. No definite extra osseous tumor outside of the chest.  Medication:    Current Outpatient Medications:  .  ALPRAZolam (XANAX) 0.5 MG tablet, Take 0.5 mg by mouth daily as needed for anxiety. , Disp: , Rfl:  .  amLODipine (NORVASC) 5 MG tablet, Take 5 mg by mouth daily. , Disp: , Rfl:  .  aspirin 81 MG tablet, Take 81 mg by mouth every morning., Disp: , Rfl:  .  atorvastatin (LIPITOR) 10 MG tablet, Take 10 mg by mouth every morning., Disp: , Rfl:  .  cetirizine (ZYRTEC) 10 MG tablet, Take 10 mg by mouth at bedtime., Disp: , Rfl:  .  citalopram (CELEXA) 40 MG tablet, Take 40 mg by  mouth every morning., Disp: , Rfl:  .  clotrimazole (LOTRIMIN) 1 % cream, Apply 1 application topically every other day., Disp: , Rfl:  .  gabapentin (NEURONTIN) 300 MG capsule, Take 300 mg by mouth at bedtime., Disp: , Rfl:  .  hydrocortisone cream 1 %, Apply 1 application topically every other day., Disp: , Rfl:  .  lisinopril-hydrochlorothiazide (PRINZIDE,ZESTORETIC) 20-25 MG tablet, Take 1 tablet by mouth every morning., Disp: , Rfl:  .  metFORMIN (GLUCOPHAGE-XR) 500 MG 24 hr tablet, Take 500 mg by mouth daily with supper. , Disp: , Rfl:  .  Multiple Vitamin (MULTIVITAMIN WITH MINERALS) TABS tablet, Take 1 tablet by mouth daily., Disp: , Rfl:  .  naproxen sodium (ALEVE) 220 MG tablet, Take 220 mg by mouth as needed. , Disp: , Rfl:  .  Omega-3 Fatty Acids (FISH OIL) 1000 MG CAPS, Take 1,000 mg by mouth daily., Disp: , Rfl:  .  omeprazole (PRILOSEC) 40 MG capsule, Take 40 mg by mouth every evening., Disp: , Rfl:  .  predniSONE (DELTASONE) 20 MG tablet, Take 2 tablets together daily for one week, then,  Take 1 tablet daily until seen by physician again.  Do not stop suddenly without consulting physician., Disp: 60 tablet, Rfl: 1 .  rOPINIRole (REQUIP) 1 MG tablet, Take 1 mg by mouth at bedtime. , Disp: , Rfl:  .  tiZANidine (ZANAFLEX) 2 MG tablet, Take 2 mg by mouth every 6 (six) hours as needed for muscle spasms., Disp: , Rfl:  .  traZODone (DESYREL) 100 MG tablet, Take 100 mg by mouth at bedtime., Disp: , Rfl:    Allergies:  Penicillins  Review of Systems: Gen:  Denies  fever, sweats, chills HEENT: Denies blurred vision, double vision. bleeds, sore throat Cvc:  No dizziness, chest pain. Resp:   Denies cough or sputum production, shortness of breath Gi: Denies swallowing difficulty, stomach pain. Gu:  Denies bladder incontinence, burning urine Ext:   No Joint pain, stiffness. Skin: No skin rash,  hives  Endoc:  No polyuria, polydipsia. Psych: No depression, insomnia. Other:  All other  systems were reviewed with the patient and were negative other that what is mentioned in the HPI.   Physical Examination:   VS: BP 118/66 (BP Location: Left Arm, Cuff Size: Large)   Pulse 68   Resp 16   Ht 6' (1.829 m)   Wt 230 lb (104.3 kg)   SpO2 96%   BMI 31.19 kg/m   General Appearance: No distress  Neuro:without focal findings,  speech normal,  HEENT: PERRLA, EOM intact.   Pulmonary: normal breath sounds, No wheezing.  CardiovascularNormal S1,S2.  No m/r/g.   Abdomen: Benign, Soft, non-tender. Renal:  No costovertebral tenderness  GU:  No performed at this time. Endoc: No evident thyromegaly, no signs of acromegaly. Skin:   warm, no rashes, no ecchymosis  Extremities: normal, no cyanosis, clubbing.  Other findings:    LABORATORY PANEL:   CBC No results for input(s): WBC, HGB, HCT, PLT in the last  168 hours. ------------------------------------------------------------------------------------------------------------------  Chemistries  No results for input(s): NA, K, CL, CO2, GLUCOSE, BUN, CREATININE, CALCIUM, MG, AST, ALT, ALKPHOS, BILITOT in the last 168 hours.  Invalid input(s): GFRCGP ------------------------------------------------------------------------------------------------------------------  Cardiac Enzymes No results for input(s): TROPONINI in the last 168 hours. ------------------------------------------------------------  RADIOLOGY:  Ct Chest W Contrast  Result Date: 06/29/2017 CLINICAL DATA:  Follow-up pulmonary nodularity. Hypermetabolic bilateral pulmonary nodules, mediastinal and hilar nodes and lytic osseous lesions on recent PET-CT. Biopsy of a lytic medial right iliac bone lesion on 04/30/2017 yielded noncaseating granulomatous inflammation. Patient has reportedly been treated with steroid therapy in the interval. EXAM: CT CHEST WITH CONTRAST TECHNIQUE: Multidetector CT imaging of the chest was performed during intravenous contrast administration.  CONTRAST:  61m ISOVUE-300 IOPAMIDOL (ISOVUE-300) INJECTION 61% COMPARISON:  04/13/2017 chest CT. FINDINGS: Cardiovascular: Normal heart size. No significant pericardial fluid/thickening. Left anterior descending coronary atherosclerosis. Atherosclerotic nonaneurysmal thoracic aorta. Normal caliber pulmonary arteries. No central pulmonary emboli. Mediastinum/Nodes: No discrete thyroid nodules. Unremarkable esophagus. No axillary adenopathy. Mildly enlarged right paratracheal, subcarinal, left prevascular, AP window and top-normal left greater than right hilar lymph nodes are stable to slightly decreased in size and demonstrate new faint internal calcifications. Representative 1.0 cm subcarinal node (series 2/image 68), slightly decreased from 1.2 cm. Representative 1.0 cm left prevascular node (series 2/image 54), stable. Lungs/Pleura: No pneumothorax. No pleural effusion. Irregular 4.1 x 1.8 cm masslike opacity in the posterior right lower lobe with coarse internal calcification (series 3/image 110), decreased from 5.3 x 2.3 cm on 04/13/2017 chest CT. Scattered irregular sub solid pulmonary nodules in the mid to upper lungs bilaterally involving the peribronchovascular interstitium and major fissures, all decreased in the interval. Representative 0.7 cm left upper lobe nodule (series 3/image 40), decreased from 1.1 cm. Representative lateral segment right middle lobe 1.0 cm nodule (series 3/image 81), mildly decreased from 1.2 cm. No new pulmonary nodularity. No acute consolidative airspace disease. Upper abdomen: Stable hypodense 1.5 cm right liver lobe lesion (series 2/image 137). A few additional scattered subcentimeter hypodense liver lesions are too small to characterize and appear unchanged. Partially visualize simple 4.2 cm interpolar right renal cyst. Exophytic simple 4.9 cm lateral upper left renal cyst. Musculoskeletal: Predominantly lytic osseous lesions scattered throughout the thoracic vertebral bodies  and right scapula are all stable. No new focal osseous lesions. Moderate thoracic spondylosis. IMPRESSION: 1. Irregular masslike opacity with coarse internal calcification in the right lower lobe is decreased in size. Scattered irregular subsolid pulmonary nodules in the mid to upper lungs bilaterally, all decreased in size and in a perilymphatic distribution. These findings are compatible with pulmonary sarcoidosis given the bone lesion biopsy results. 2. Mild mediastinal and bilateral hilar lymphadenopathy is stable to decreased in size and demonstrates new faint internal nodal calcification, compatible with sarcoidosis. 3. Stable lytic osseous lesions throughout the thoracic osseous structures, probably due to sarcoidosis. 4. Stable indeterminate small low-attenuation liver lesions, which were non hypermetabolic on recent PET-CT, more likely benign. 5. One vessel coronary atherosclerosis. Aortic Atherosclerosis (ICD10-I70.0). Electronically Signed   By: JIlona SorrelM.D.   On: 06/29/2017 14:10       Thank  you for the consultation and for allowing [facility] to assist in the care of your patient. Our recommendations are noted above.  Please contact u[place]if we can be of further service.   DMarda Stalker MD.  Board Certified in Internal Medicine, Pulmonary Medicine, [place] and Sleep Medicine.  [facility] Office Number:  [place]  Patricia Pesa, M.D.  Merton Border, M.D  07/01/2017

## 2017-07-01 NOTE — Patient Instructions (Addendum)
Will send for a blood test for TB.  Repeat blood test (liver and kidney function in 3 months-- before next visit).  Will call you next week, and if negative will send in prescription for methotrexate.  Repeat CT chest in about 3 months and follow up after that.

## 2017-07-02 DIAGNOSIS — M5416 Radiculopathy, lumbar region: Secondary | ICD-10-CM | POA: Diagnosis not present

## 2017-07-02 DIAGNOSIS — M48062 Spinal stenosis, lumbar region with neurogenic claudication: Secondary | ICD-10-CM | POA: Diagnosis not present

## 2017-07-04 LAB — QUANTIFERON-TB GOLD PLUS (RQFGPL)
QUANTIFERON TB2 AG VALUE: 0.01 [IU]/mL
QuantiFERON Mitogen Value: 1.16 IU/mL
QuantiFERON Nil Value: 0.01 IU/mL
QuantiFERON TB1 Ag Value: 0.01 IU/mL

## 2017-07-04 LAB — QUANTIFERON-TB GOLD PLUS: QUANTIFERON-TB GOLD PLUS: NEGATIVE

## 2017-07-07 ENCOUNTER — Telehealth: Payer: Self-pay | Admitting: Internal Medicine

## 2017-07-07 ENCOUNTER — Other Ambulatory Visit: Payer: Self-pay | Admitting: Internal Medicine

## 2017-07-07 MED ORDER — PREDNISONE 10 MG PO TABS: 20.0000 mg | ORAL_TABLET | Freq: Every day | ORAL | 2 refills | Status: DC

## 2017-07-07 NOTE — Telephone Encounter (Signed)
Will close encounter, as another has been created.   

## 2017-07-07 NOTE — Telephone Encounter (Signed)
Rx for prednisone 10mg  daily was sent to [facility] on 07/01/17. Pt states he was under the impression that he would continue at 20mg  daily.  OV note copied below stated to continue on 20mg  daily.   Pt also states during last OV, it was discussed that he would eventually stop prednisone and start Methotrexate. Pt would like a timeline as to when this switch will happen.  DR please advise.   Sarcoidosis, Lung nodule with paratracheal and hilar lymphadenopathy. -Right lower lobe nodule with scattered mediastinal lymphadenopathy, multiple bilateral lung nodules, right lower lobe nodule does not appear typical of lung cancer, in addition he is numerous spinal disease lesions, one was biopsied, and was positive for noncaseating granulomata. -Continue prednisone 20 mg daily. -Check TB QuantiFERON, if negative will start methotrexate, and titrate up over the next few weeks.  This can be paired with decrease in dose of prednisone. -Patient is asked to see an ophthalmologist for an eye exam. -Recheck CT of the chest after 3 months. -Recheck of metabolic panel and liver functions in 3 months. --Pt has baseline ECG, calcium normal.

## 2017-07-07 NOTE — Telephone Encounter (Signed)
Pt is requesting that Rx for prednisone 20mg  that was sent in today be sent to [facility]. I have called and requested that [facility] cancel Rx. Prednisone 20mg  has been sent to [facility].  Pt is aware of below message and voiced his understanding.  Nothing further is needed.    Laverle Hobby, MD  to Me      07/07/17 4:19 PM  Note    The prednisone prescription should have been for two 10 mg tabs daily, I corrected this. I will start her methotrexate after her Tb quantiferon test comes back negative. I hope to wean her off the prednisone over about 3 months.

## 2017-07-07 NOTE — Telephone Encounter (Signed)
The prednisone prescription should have been for two 10 mg tabs daily, I corrected this. I will start her methotrexate after her Tb quantiferon test comes back negative. I hope to wean her off the prednisone over about 3 months.

## 2017-07-07 NOTE — Telephone Encounter (Signed)
Pt calling stating asking if we may please send in correct prescription to [facility]   He states he was to be on Prednisone 10 mg 2 a day with 180 days supply   He states they received the prescription but [employer] had it only for one day and gave patient only 90 day supply     Please advise

## 2017-07-07 NOTE — Telephone Encounter (Signed)
Patient calling back He states that he had a Prednisone 10 MG refill request sent in earlier but [facility]  He would like for it to be changed to [facility] for 180 days Please call with any questions

## 2017-07-08 DIAGNOSIS — M48062 Spinal stenosis, lumbar region with neurogenic claudication: Secondary | ICD-10-CM | POA: Diagnosis not present

## 2017-07-15 DIAGNOSIS — M48062 Spinal stenosis, lumbar region with neurogenic claudication: Secondary | ICD-10-CM | POA: Diagnosis not present

## 2017-07-20 ENCOUNTER — Telehealth: Payer: Self-pay | Admitting: Internal Medicine

## 2017-07-20 ENCOUNTER — Other Ambulatory Visit: Payer: Self-pay | Admitting: Internal Medicine

## 2017-07-20 DIAGNOSIS — D869 Sarcoidosis, unspecified: Secondary | ICD-10-CM

## 2017-07-20 MED ORDER — METHOTREXATE 2.5 MG PO TABS: ORAL_TABLET | ORAL | 3 refills | Status: DC

## 2017-07-20 NOTE — Telephone Encounter (Signed)
Pt informed. Nothing further needed. 

## 2017-07-20 NOTE — Telephone Encounter (Signed)
Spoke with pt and will fax RX to [facility] order. Nothing further needed.

## 2017-07-20 NOTE — Telephone Encounter (Signed)
Please advise on message below in regards to the medications and what pt should be taking.

## 2017-07-20 NOTE — Telephone Encounter (Signed)
Now that Tb test was negative, can start methotrexate, order placed.  Needs repeat blood work in 3 months and follow up after that. Continue currently prednisone dose until follow up.  Cut prednisone dose in half after taking methotrexate for 1 month.

## 2017-07-20 NOTE — Telephone Encounter (Signed)
Pt calling stating he was prescribed Prednisone  He was told by Dr Ashby Dawes  he may want patient to start methotrexate sodium  Patient was under the assumption after patient did TB test (which came back negative)  That we were to submit this new prescription to his [facility] mail order But he checked with [facility] and there is still no order there He is wondering if Dr Ashby Dawes is weaning patient off the Prednisone   Would like a call back about this

## 2017-07-20 NOTE — Telephone Encounter (Signed)
Clarification; patient should cut prednisone dose in half after taking methotrexate for 1 month, continue on that dose until follow up.

## 2017-07-22 DIAGNOSIS — M48062 Spinal stenosis, lumbar region with neurogenic claudication: Secondary | ICD-10-CM | POA: Diagnosis not present

## 2017-07-24 DIAGNOSIS — M48062 Spinal stenosis, lumbar region with neurogenic claudication: Secondary | ICD-10-CM | POA: Diagnosis not present

## 2017-07-24 DIAGNOSIS — M5136 Other intervertebral disc degeneration, lumbar region: Secondary | ICD-10-CM | POA: Diagnosis not present

## 2017-07-24 DIAGNOSIS — M5416 Radiculopathy, lumbar region: Secondary | ICD-10-CM | POA: Diagnosis not present

## 2017-08-04 DIAGNOSIS — M48062 Spinal stenosis, lumbar region with neurogenic claudication: Secondary | ICD-10-CM | POA: Diagnosis not present

## 2017-08-12 DIAGNOSIS — M48062 Spinal stenosis, lumbar region with neurogenic claudication: Secondary | ICD-10-CM | POA: Diagnosis not present

## 2017-08-14 ENCOUNTER — Telehealth: Payer: Self-pay | Admitting: *Deleted

## 2017-08-14 DIAGNOSIS — M5416 Radiculopathy, lumbar region: Secondary | ICD-10-CM | POA: Diagnosis not present

## 2017-08-14 DIAGNOSIS — M4726 Other spondylosis with radiculopathy, lumbar region: Secondary | ICD-10-CM | POA: Diagnosis not present

## 2017-08-14 DIAGNOSIS — M5136 Other intervertebral disc degeneration, lumbar region: Secondary | ICD-10-CM | POA: Diagnosis not present

## 2017-08-14 DIAGNOSIS — M48062 Spinal stenosis, lumbar region with neurogenic claudication: Secondary | ICD-10-CM | POA: Diagnosis not present

## 2017-08-14 NOTE — Telephone Encounter (Signed)
I called patient to speak with him about his continuing care at the cancer center.  Patient is under the care of Dr. Loreen Freud.  He is being taken care of by him and at this time there is no cancer and no longer needs to come back to the cancer center.  Dr. Janese Banks has spoke with Dr. Loreen Freud and if anything changes then he will let us know and will make an appointment for patient.  Patient agreeable to this plan.  I asked patient if he wanted [place] to mail him his CDs and scan reports and patient says that those were just copies and he does not need them.  We will put him in our shredder to be destroyed

## 2017-08-14 NOTE — Telephone Encounter (Signed)
-----   Message from Sindy Guadeloupe, MD sent at 08/14/2017 11:54 AM EDT ----- Cephus Richer,  I just wanted to follow up on this patient. I have not seen him back for follow up since this all turned out to be sarcoidosis. I saw CT thorax in march was looking better. Please let me know if there is any concern for malignancy and if there is a need for him to follow up with me. Are you planning to re image his spine?  Regards, Astrid Divine

## 2017-08-19 ENCOUNTER — Telehealth: Payer: Self-pay | Admitting: Internal Medicine

## 2017-08-19 NOTE — Telephone Encounter (Signed)
Called patient and made aware lab order is standing. He will be called with CT scan appt once approved by insurance.

## 2017-08-19 NOTE — Telephone Encounter (Signed)
Patient has upcoming fu and needs labs and ct prior to appt.  Please call to schedule.

## 2017-08-20 ENCOUNTER — Other Ambulatory Visit
Admission: RE | Admit: 2017-08-20 | Discharge: 2017-08-20 | Disposition: A | Discharge status: Home / Self Care | Payer: Medicare HMO | Source: Ambulatory Visit | Attending: Internal Medicine | Admitting: Internal Medicine

## 2017-08-20 ENCOUNTER — Other Ambulatory Visit: Payer: Self-pay | Admitting: Otolaryngology

## 2017-08-20 DIAGNOSIS — D869 Sarcoidosis, unspecified: Secondary | ICD-10-CM | POA: Diagnosis not present

## 2017-08-20 DIAGNOSIS — R221 Localized swelling, mass and lump, neck: Secondary | ICD-10-CM

## 2017-08-20 DIAGNOSIS — R918 Other nonspecific abnormal finding of lung field: Secondary | ICD-10-CM | POA: Insufficient documentation

## 2017-08-20 LAB — COMPREHENSIVE METABOLIC PANEL
ALBUMIN: 3.8 g/dL (ref 3.5–5.0)
ALK PHOS: 72 U/L (ref 38–126)
ALT: 22 U/L (ref 17–63)
ANION GAP: 6 (ref 5–15)
AST: 20 U/L (ref 15–41)
BILIRUBIN TOTAL: 0.9 mg/dL (ref 0.3–1.2)
BUN: 19 mg/dL (ref 6–20)
CO2: 30 mmol/L (ref 22–32)
Calcium: 8.7 mg/dL — ABNORMAL LOW (ref 8.9–10.3)
Chloride: 101 mmol/L (ref 101–111)
Creatinine, Ser: 0.9 mg/dL (ref 0.61–1.24)
GFR calc Af Amer: >60 mL/min (ref >60)
GFR calc non Af Amer: >60 mL/min (ref >60)
GLUCOSE: 164 mg/dL — AB (ref 65–99)
POTASSIUM: 4.3 mmol/L (ref 3.5–5.1)
SODIUM: 137 mmol/L (ref 135–145)
Total Protein: 6.2 g/dL — ABNORMAL LOW (ref 6.5–8.1)

## 2017-08-20 NOTE — Telephone Encounter (Signed)
LM with pt's wife for him to return my call to schedule CT Chest. Catha Gosselin

## 2017-08-20 NOTE — Telephone Encounter (Signed)
CT Chest is due around 10/01/17. Return pt's call, however, pt was not home at the time.  Waiting on pt to return my call.Rhonda J Cobb

## 2017-08-21 NOTE — Telephone Encounter (Signed)
Pt returned call. Explained to patient that his CT Chest is due around the middle of June.  Per patient, cancel his 09/15/17 appointment, obtain PA on CT and then contact patient back and schedule both appointments.  Appointment for 09/15/17 canceled. In process of obtaining PA on CT and then will contact patient back to schedule both. Rhonda J Cobb Nothing else needed at this time. Rhonda J Cobb

## 2017-08-25 ENCOUNTER — Ambulatory Visit: Payer: Medicare HMO

## 2017-08-25 DIAGNOSIS — D869 Sarcoidosis, unspecified: Secondary | ICD-10-CM | POA: Diagnosis not present

## 2017-08-26 ENCOUNTER — Ambulatory Visit
Admission: RE | Admit: 2017-08-26 | Discharge: 2017-08-26 | Disposition: A | Discharge status: Home / Self Care | Payer: Medicare HMO | Source: Ambulatory Visit | Attending: Otolaryngology | Admitting: Otolaryngology

## 2017-08-26 DIAGNOSIS — R221 Localized swelling, mass and lump, neck: Secondary | ICD-10-CM | POA: Diagnosis not present

## 2017-08-28 ENCOUNTER — Other Ambulatory Visit: Payer: Self-pay | Admitting: Otolaryngology

## 2017-08-28 DIAGNOSIS — R221 Localized swelling, mass and lump, neck: Secondary | ICD-10-CM

## 2017-09-09 ENCOUNTER — Ambulatory Visit
Admission: RE | Admit: 2017-09-09 | Discharge: 2017-09-09 | Disposition: A | Discharge status: Home / Self Care | Payer: Medicare HMO | Source: Ambulatory Visit | Attending: Otolaryngology | Admitting: Otolaryngology

## 2017-09-09 ENCOUNTER — Other Ambulatory Visit: Payer: Self-pay | Admitting: Family Medicine

## 2017-09-09 DIAGNOSIS — R221 Localized swelling, mass and lump, neck: Secondary | ICD-10-CM | POA: Diagnosis not present

## 2017-09-09 DIAGNOSIS — M7989 Other specified soft tissue disorders: Secondary | ICD-10-CM | POA: Diagnosis not present

## 2017-09-09 DIAGNOSIS — M79661 Pain in right lower leg: Secondary | ICD-10-CM

## 2017-09-09 MED ORDER — SODIUM CHLORIDE 0.9 % IV SOLN
INTRAVENOUS | Status: DC
Start: 2017-09-09 — End: 2017-09-10

## 2017-09-09 NOTE — Procedures (Signed)
Rt neck palpable nodule  S/p [place] FNA   No comp Stable EBL 0 Path pending Full report in pacs

## 2017-09-10 ENCOUNTER — Ambulatory Visit
Admission: RE | Admit: 2017-09-10 | Discharge: 2017-09-10 | Disposition: A | Discharge status: Home / Self Care | Payer: Medicare HMO | Source: Ambulatory Visit | Attending: Family Medicine | Admitting: Family Medicine

## 2017-09-10 DIAGNOSIS — M7989 Other specified soft tissue disorders: Secondary | ICD-10-CM | POA: Insufficient documentation

## 2017-09-10 DIAGNOSIS — I82401 Acute embolism and thrombosis of unspecified deep veins of right lower extremity: Secondary | ICD-10-CM | POA: Diagnosis not present

## 2017-09-10 DIAGNOSIS — I82491 Acute embolism and thrombosis of other specified deep vein of right lower extremity: Secondary | ICD-10-CM | POA: Diagnosis not present

## 2017-09-10 DIAGNOSIS — M79661 Pain in right lower leg: Secondary | ICD-10-CM

## 2017-09-10 DIAGNOSIS — I82451 Acute embolism and thrombosis of right peroneal vein: Secondary | ICD-10-CM

## 2017-09-10 DIAGNOSIS — I809 Phlebitis and thrombophlebitis of unspecified site: Secondary | ICD-10-CM

## 2017-09-10 HISTORY — DX: Acute embolism and thrombosis of right peroneal vein: I82.451

## 2017-09-10 HISTORY — DX: Phlebitis and thrombophlebitis of unspecified site: I80.9

## 2017-09-10 LAB — CYTOLOGY - NON PAP

## 2017-09-14 DIAGNOSIS — E119 Type 2 diabetes mellitus without complications: Secondary | ICD-10-CM | POA: Diagnosis not present

## 2017-09-14 DIAGNOSIS — D869 Sarcoidosis, unspecified: Secondary | ICD-10-CM | POA: Diagnosis not present

## 2017-09-14 DIAGNOSIS — E7800 Pure hypercholesterolemia, unspecified: Secondary | ICD-10-CM | POA: Diagnosis not present

## 2017-09-14 DIAGNOSIS — Z0000 Encounter for general adult medical examination without abnormal findings: Secondary | ICD-10-CM | POA: Diagnosis not present

## 2017-09-14 DIAGNOSIS — I10 Essential (primary) hypertension: Secondary | ICD-10-CM | POA: Diagnosis not present

## 2017-09-15 ENCOUNTER — Ambulatory Visit: Payer: Medicare HMO | Admitting: Internal Medicine

## 2017-09-21 DIAGNOSIS — Z0000 Encounter for general adult medical examination without abnormal findings: Secondary | ICD-10-CM | POA: Diagnosis not present

## 2017-09-21 DIAGNOSIS — I10 Essential (primary) hypertension: Secondary | ICD-10-CM | POA: Diagnosis not present

## 2017-09-21 DIAGNOSIS — E7800 Pure hypercholesterolemia, unspecified: Secondary | ICD-10-CM | POA: Diagnosis not present

## 2017-09-21 DIAGNOSIS — E119 Type 2 diabetes mellitus without complications: Secondary | ICD-10-CM | POA: Diagnosis not present

## 2017-09-21 DIAGNOSIS — F325 Major depressive disorder, single episode, in full remission: Secondary | ICD-10-CM | POA: Diagnosis not present

## 2017-09-28 ENCOUNTER — Ambulatory Visit
Admission: RE | Admit: 2017-09-28 | Discharge: 2017-09-28 | Disposition: A | Discharge status: Home / Self Care | Payer: Medicare HMO | Source: Ambulatory Visit | Attending: Internal Medicine | Admitting: Internal Medicine

## 2017-09-28 DIAGNOSIS — I2510 Atherosclerotic heart disease of native coronary artery without angina pectoris: Secondary | ICD-10-CM | POA: Insufficient documentation

## 2017-09-28 DIAGNOSIS — I700 Atherosclerosis of aorta: Secondary | ICD-10-CM | POA: Diagnosis not present

## 2017-09-28 DIAGNOSIS — R918 Other nonspecific abnormal finding of lung field: Secondary | ICD-10-CM

## 2017-09-30 ENCOUNTER — Ambulatory Visit: Payer: Medicare HMO | Admitting: Internal Medicine

## 2017-10-05 NOTE — Progress Notes (Signed)
[facility] Consultation      Assessment and Plan:  Sarcoidosis, Lung nodule with paratracheal and hilar lymphadenopathy. -Right lower lobe nodule with scattered mediastinal lymphadenopathy, multiple bilateral lung nodules, right lower lobe nodule does not appear typical of lung cancer, in addition he is numerous spinal disease lesions, one was biopsied, and was positive for noncaseating granulomata. -TB QuantiFERON negative. -Increase methotrexate to 10 mg weekly, prednisone was decreased to 5 mg daily. -Patient is asked to see an ophthalmologist for an eye exam. -MRI in 3 months for spinal stenosis and spinal lesions.  If increasing in size will need to refer for possible biopsy.   Orders Placed This Encounter  Procedures  . MR LUMBAR SPINE W CONTRAST  . Hepatic function panel  . Angiotensin converting enzyme  . Comp Met (CMET)   Return in about 3 months (around 01/06/2018) for after testing complete. .    Date: 10/05/2017  MRN# [id number] Kepler Mccabe 1946-01-30   George Daniels is a 72 y.o. old male seen in consultation for chief complaint of:    Chief Complaint  Patient presents with  . Follow-up  . Sarcoidosis    denies any symptoms    HPI:  Patient was noted to have back pain, an MRI showed progressive bone lesions, which have been present since 2013.  He subsequently had a PET scan which showed increased uptake in bone lesions suggestive of metastatic disease.  He then underwent CT guided biopsy of indeterminate lytic lesion involving the posterior aspect of the right ilium, which showed granulomatous disease suggestive of sarcoidosis.  He was asked to start methotrexate, and decreased prednisone to 10 mg daily. He is taking methotrexate now to 7.5 mg once weekly, started 2 months ago. He notes that is the same as always and has not improved on prednisone.   Since his last visit he had RL calf clot, he is on xarelto.   He continues have chronic  back pain of 4-5/10. It has progressed from 10 years ago.   **Liver enzymes 08/20/17>>normal.  **chest 06/29/17>>; multiple bilateral nodules show slight reduction in size, there is also mild internal calcification in the masslike opacity in the right lower lobe and mediastinal lymphadenopathy.    PET scan radiologist report iMPRESSION: 1. Multiple hypermetabolic pulmonary nodules bilaterally, including a dominant spiculated lesion in the right lower lobe, likely reflecting primary bronchogenic carcinoma. 2. Associated hypermetabolic mediastinal and bilateral hilar lymph nodes consistent with nodal metastases. 3. Widespread osseous metastatic disease. 4. No definite extra osseous tumor outside of the chest.  Medication:    Current Outpatient Medications:  .  ALPRAZolam (XANAX) 0.5 MG tablet, Take 0.5 mg by mouth daily as needed for anxiety. , Disp: , Rfl:  .  amLODipine (NORVASC) 5 MG tablet, Take 5 mg by mouth daily. , Disp: , Rfl:  .  aspirin 81 MG tablet, Take 81 mg by mouth every morning., Disp: , Rfl:  .  atorvastatin (LIPITOR) 10 MG tablet, Take 10 mg by mouth every morning., Disp: , Rfl:  .  cetirizine (ZYRTEC) 10 MG tablet, Take 10 mg by mouth at bedtime., Disp: , Rfl:  .  citalopram (CELEXA) 40 MG tablet, Take 40 mg by mouth every morning., Disp: , Rfl:  .  clotrimazole (LOTRIMIN) 1 % cream, Apply 1 application topically every other day., Disp: , Rfl:  .  gabapentin (NEURONTIN) 300 MG capsule, Take 300 mg by mouth at bedtime., Disp: , Rfl:  .  hydrocortisone cream 1 %,  Apply 1 application topically every other day., Disp: , Rfl:  .  lisinopril-hydrochlorothiazide (PRINZIDE,ZESTORETIC) 20-25 MG tablet, Take 1 tablet by mouth every morning., Disp: , Rfl:  .  metFORMIN (GLUCOPHAGE-XR) 500 MG 24 hr tablet, Take 500 mg by mouth daily with supper. , Disp: , Rfl:  .  methotrexate (RHEUMATREX) 2.5 MG tablet, Take 1 tablet weekly on first week.  During 2nd week take 2 tablets (once  weekly).  During 3rd week take 3 tablets (once weekly). Continue taking 3 tablets once weekly until follow up., Disp: 36 tablet, Rfl: 3 .  Multiple Vitamin (MULTIVITAMIN WITH MINERALS) TABS tablet, Take 1 tablet by mouth daily., Disp: , Rfl:  .  naproxen sodium (ALEVE) 220 MG tablet, Take 220 mg by mouth as needed. , Disp: , Rfl:  .  Omega-3 Fatty Acids (FISH OIL) 1000 MG CAPS, Take 1,000 mg by mouth daily., Disp: , Rfl:  .  omeprazole (PRILOSEC) 40 MG capsule, Take 40 mg by mouth every evening., Disp: , Rfl:  .  predniSONE (DELTASONE) 10 MG tablet, Take 1 tablet (10 mg total) by mouth daily with breakfast., Disp: 180 tablet, Rfl: 1 .  predniSONE (DELTASONE) 10 MG tablet, Take 2 tablets (20 mg total) by mouth daily with breakfast., Disp: 180 tablet, Rfl: 2 .  predniSONE (DELTASONE) 20 MG tablet, Take 2 tablets together daily for one week, then,  Take 1 tablet daily until seen by physician again.  Do not stop suddenly without consulting physician., Disp: 60 tablet, Rfl: 1 .  rOPINIRole (REQUIP) 1 MG tablet, Take 1 mg by mouth at bedtime. , Disp: , Rfl:  .  tiZANidine (ZANAFLEX) 2 MG tablet, Take 2 mg by mouth every 6 (six) hours as needed for muscle spasms., Disp: , Rfl:  .  traZODone (DESYREL) 100 MG tablet, Take 100 mg by mouth at bedtime., Disp: , Rfl:    Allergies:  Penicillins  Review of Systems: Gen:  Denies  fever, sweats, chills HEENT: Denies blurred vision, double vision. bleeds, sore throat Cvc:  No dizziness, chest pain. Resp:   Denies cough or sputum production, shortness of breath Gi: Denies swallowing difficulty, stomach pain. Gu:  Denies bladder incontinence, burning urine Ext:   No Joint pain, stiffness. Skin: No skin rash,  hives  Endoc:  No polyuria, polydipsia. Psych: No depression, insomnia. Other:  All other systems were reviewed with the patient and were negative other that what is mentioned in the HPI.   Physical Examination:   VS: BP 110/76 (BP Location: Left  Arm, Cuff Size: Normal)   Pulse 69   Ht 6' (1.829 m)   Wt 239 lb (108.4 kg)   SpO2 98%   BMI 32.41 kg/m   General Appearance: No distress  Neuro:without focal findings,  speech normal,  HEENT: PERRLA, EOM intact.   Pulmonary: normal breath sounds, No wheezing.  CardiovascularNormal S1,S2.  No m/r/g.   Abdomen: Benign, Soft, non-tender. Renal:  No costovertebral tenderness  GU:  No performed at this time. Endoc: No evident thyromegaly, no signs of acromegaly. Skin:   warm, no rashes, no ecchymosis  Extremities: normal, no cyanosis, clubbing.  Other findings:    LABORATORY PANEL:   CBC No results for input(s): WBC, HGB, HCT, PLT in the last 168 hours. ------------------------------------------------------------------------------------------------------------------  Chemistries  No results for input(s): NA, K, CL, CO2, GLUCOSE, BUN, CREATININE, CALCIUM, MG, AST, ALT, ALKPHOS, BILITOT in the last 168 hours.  Invalid input(s): GFRCGP ------------------------------------------------------------------------------------------------------------------  Cardiac Enzymes No results for input(s): TROPONINI  in the last 168 hours. ------------------------------------------------------------  RADIOLOGY:  No results found.     Thank  you for the consultation and for allowing [facility] to assist in the care of your patient. Our recommendations are noted above.  Please contact us if we can be of further service.  Marda Stalker, M.D., F.C.C.P.  Board Certified in Internal Medicine, Pulmonary Medicine, [place], and Sleep Medicine.  [facility] Office Number: [phone]   10/05/2017

## 2017-10-06 ENCOUNTER — Encounter: Payer: Self-pay | Admitting: Internal Medicine

## 2017-10-06 ENCOUNTER — Ambulatory Visit: Payer: Medicare HMO | Admitting: Internal Medicine

## 2017-10-06 VITALS — BP 110/76 | HR 69 | Ht 72.0 in | Wt 239.0 lb

## 2017-10-06 DIAGNOSIS — M4807 Spinal stenosis, lumbosacral region: Secondary | ICD-10-CM

## 2017-10-06 DIAGNOSIS — D869 Sarcoidosis, unspecified: Secondary | ICD-10-CM | POA: Diagnosis not present

## 2017-10-06 NOTE — Patient Instructions (Addendum)
Increase methotrexate to 10 mg weekly.  Reduce prednisone to 5 mg daily until next visit.  MRI and blood work in about 3 months and follow up after that.

## 2017-10-06 NOTE — Addendum Note (Signed)
Addended by: Oscar La R on: 10/06/2017 04:06 PM   Modules accepted: Orders

## 2017-10-21 DIAGNOSIS — S63502D Unspecified sprain of left wrist, subsequent encounter: Secondary | ICD-10-CM | POA: Diagnosis not present

## 2017-11-16 DIAGNOSIS — G2581 Restless legs syndrome: Secondary | ICD-10-CM | POA: Diagnosis not present

## 2017-11-17 ENCOUNTER — Telehealth: Payer: Self-pay | Admitting: Internal Medicine

## 2017-11-17 NOTE — Telephone Encounter (Signed)
Per Sadia with Central Scheduling this order needs to be changed to with and without contrast

## 2017-11-17 NOTE — Telephone Encounter (Signed)
Patient calling to schedule MRI rior to September recall ov with Ram.  Please call.

## 2017-11-18 ENCOUNTER — Other Ambulatory Visit: Payer: Self-pay | Admitting: Internal Medicine

## 2017-11-18 DIAGNOSIS — M4807 Spinal stenosis, lumbosacral region: Secondary | ICD-10-CM

## 2017-11-18 NOTE — Telephone Encounter (Signed)
MRI order has been changed and I have tried to contact the patient. Per wife the patient has several appts scheduled for his Mother and wants to try and schedule around her appts. I told the wife to have him call back as soon as he can and we will shoot for what ever day that he would be looking at to schedule the MRI and rov with Dr. Juanell Fairly

## 2017-11-18 NOTE — Progress Notes (Signed)
Per Sadia with central scheduling previous order with contrast only needed to be changed.

## 2017-11-19 ENCOUNTER — Other Ambulatory Visit: Payer: Self-pay | Admitting: *Deleted

## 2017-11-19 DIAGNOSIS — D869 Sarcoidosis, unspecified: Secondary | ICD-10-CM

## 2017-11-19 NOTE — Telephone Encounter (Signed)
MR LUMBAR SPINE W WO CONTRAST Scan [facility] at [facility] on 12/24/2017 @ 3:00pm arrival time 2:30pm Patient knows that he needs labs drawn before the MRI Patient is aware of the appt and location.

## 2017-11-30 DIAGNOSIS — I824Y1 Acute embolism and thrombosis of unspecified deep veins of right proximal lower extremity: Secondary | ICD-10-CM | POA: Diagnosis not present

## 2017-11-30 DIAGNOSIS — I10 Essential (primary) hypertension: Secondary | ICD-10-CM | POA: Diagnosis not present

## 2017-11-30 DIAGNOSIS — E7800 Pure hypercholesterolemia, unspecified: Secondary | ICD-10-CM | POA: Diagnosis not present

## 2017-11-30 DIAGNOSIS — F325 Major depressive disorder, single episode, in full remission: Secondary | ICD-10-CM | POA: Diagnosis not present

## 2017-11-30 DIAGNOSIS — E119 Type 2 diabetes mellitus without complications: Secondary | ICD-10-CM | POA: Diagnosis not present

## 2017-12-16 DIAGNOSIS — R221 Localized swelling, mass and lump, neck: Secondary | ICD-10-CM | POA: Diagnosis not present

## 2017-12-24 ENCOUNTER — Ambulatory Visit
Admission: RE | Admit: 2017-12-24 | Discharge: 2017-12-24 | Disposition: A | Discharge status: Home / Self Care | Payer: Medicare HMO | Source: Ambulatory Visit | Attending: Internal Medicine | Admitting: Internal Medicine

## 2017-12-24 ENCOUNTER — Telehealth: Payer: Self-pay | Admitting: Internal Medicine

## 2017-12-24 DIAGNOSIS — M4807 Spinal stenosis, lumbosacral region: Secondary | ICD-10-CM | POA: Insufficient documentation

## 2017-12-24 DIAGNOSIS — D869 Sarcoidosis, unspecified: Secondary | ICD-10-CM | POA: Insufficient documentation

## 2017-12-24 DIAGNOSIS — M48061 Spinal stenosis, lumbar region without neurogenic claudication: Secondary | ICD-10-CM | POA: Diagnosis not present

## 2017-12-24 DIAGNOSIS — M5126 Other intervertebral disc displacement, lumbar region: Secondary | ICD-10-CM | POA: Insufficient documentation

## 2017-12-24 MED ORDER — GADOBENATE DIMEGLUMINE 529 MG/ML IV SOLN
20.0000 mL | Freq: Once | INTRAVENOUS | Status: AC | PRN
  Administered 2017-12-24: 20 mL via INTRAVENOUS

## 2017-12-24 NOTE — Telephone Encounter (Signed)
Please call pt to let know if he needs to get labwork and if so,  where to have this drawn.

## 2017-12-24 NOTE — Telephone Encounter (Signed)
Patient notified labs before MRI.

## 2017-12-28 LAB — POCT I-STAT CREATININE: Creatinine, Ser: 1 mg/dL (ref 0.61–1.24)

## 2017-12-31 DIAGNOSIS — G8929 Other chronic pain: Secondary | ICD-10-CM | POA: Diagnosis not present

## 2017-12-31 DIAGNOSIS — M5442 Lumbago with sciatica, left side: Secondary | ICD-10-CM | POA: Diagnosis not present

## 2018-01-04 ENCOUNTER — Other Ambulatory Visit
Admission: RE | Admit: 2018-01-04 | Discharge: 2018-01-04 | Disposition: A | Discharge status: Home / Self Care | Payer: Medicare HMO | Source: Ambulatory Visit | Attending: Internal Medicine | Admitting: Internal Medicine

## 2018-01-04 DIAGNOSIS — D869 Sarcoidosis, unspecified: Secondary | ICD-10-CM | POA: Diagnosis not present

## 2018-01-04 LAB — COMPREHENSIVE METABOLIC PANEL
ALT: 24 U/L (ref 0–44)
AST: 23 U/L (ref 15–41)
Albumin: 4.5 g/dL (ref 3.5–5.0)
Alkaline Phosphatase: 78 U/L (ref 38–126)
Anion gap: 11 (ref 5–15)
BILIRUBIN TOTAL: 1 mg/dL (ref 0.3–1.2)
BUN: 24 mg/dL — AB (ref 8–23)
CO2: 27 mmol/L (ref 22–32)
CREATININE: 0.92 mg/dL (ref 0.61–1.24)
Calcium: 9.4 mg/dL (ref 8.9–10.3)
Chloride: 99 mmol/L (ref 98–111)
GFR calc Af Amer: >60 mL/min (ref >60)
Glucose, Bld: 122 mg/dL — ABNORMAL HIGH (ref 70–99)
POTASSIUM: 3.9 mmol/L (ref 3.5–5.1)
Sodium: 137 mmol/L (ref 135–145)
TOTAL PROTEIN: 6.6 g/dL (ref 6.5–8.1)

## 2018-01-05 LAB — ANGIOTENSIN CONVERTING ENZYME

## 2018-01-07 ENCOUNTER — Encounter: Payer: Self-pay | Admitting: Internal Medicine

## 2018-01-07 ENCOUNTER — Ambulatory Visit: Payer: Medicare HMO | Admitting: Internal Medicine

## 2018-01-07 ENCOUNTER — Telehealth: Payer: Self-pay | Admitting: Internal Medicine

## 2018-01-07 VITALS — BP 112/80 | HR 75 | Resp 16 | Ht 72.0 in | Wt 239.0 lb

## 2018-01-07 DIAGNOSIS — M4807 Spinal stenosis, lumbosacral region: Secondary | ICD-10-CM | POA: Diagnosis not present

## 2018-01-07 DIAGNOSIS — D869 Sarcoidosis, unspecified: Secondary | ICD-10-CM | POA: Diagnosis not present

## 2018-01-07 MED ORDER — METHOTREXATE 2.5 MG PO TABS: 12.5000 mg | ORAL_TABLET | ORAL | 3 refills | Status: DC

## 2018-01-07 NOTE — Telephone Encounter (Signed)
°*  STAT* If patient is at the pharmacy, call can be transferred to refill team.   1. Which medications need to be refilled? (please list name of each medication and dose if known) Methotrexate 12.5 mg po weekly   2. Which pharmacy/location (including street and city if local pharmacy) is medication to be sent to? [facility]   3. Do they need a 30 day or 90 day supply? [place]

## 2018-01-07 NOTE — Patient Instructions (Addendum)
Increase methotrexate to 5 tablets weekly (12.5 mg weekly).  Stop prednisone, if develop withdrawal symptoms (excess fatigue, dizziness when standing) restart prednisone and let us know.  Repeat blood work in 3 months and followup.   Resources for sarcoidosis:  Www.stopsarcoidosis.org VerifiedMovies.gl

## 2018-01-07 NOTE — Telephone Encounter (Signed)
[facility] delivery. 5 tablets a week per Dr. Juanell Fairly.

## 2018-01-07 NOTE — Progress Notes (Signed)
[facility] Consultation      Assessment and Plan:  Sarcoidosis, Lung nodule with paratracheal and hilar lymphadenopathy. -Continues to have scattered lung nodules, mediastinal lymphadenopathy. -TB QuantiFERON negative. -Increase methotrexate to 12.5 mg weekly, will wean off of prednisone. -Repeat blood work in 3 months.  Follow-up after that. Patient was counseled extensively on the current status of his sarcoidosis and treatment plan.  All questions were answered.  Sarcoidosis of the spine. -Continued back pain, which appears stable. - Patient is followed up with neurosurgery, no intervention was indicated at this time.  Greater than 50% of the 25 minute visit was spent in counseling/coordination of care regarding sarcoidosis.   Meds ordered this encounter  Medications  . methotrexate (RHEUMATREX) 2.5 MG tablet    Sig: Take 5 tablets (12.5 mg total) by mouth once a week. Caution:Chemotherapy. Protect from light.    Dispense:  75 tablet    Refill:  3     Orders Placed This Encounter  Procedures  . Comp Met (CMET)  . Angiotensin converting enzyme   Return in about 3 months (around 04/08/2018) for after blood work. .    Date: 01/07/2018  MRN# [id number] George Daniels [birth year]   George Daniels is a 72 y.o. old male seen in consultation for chief complaint of:    Chief Complaint  Patient presents with  . Sarcoidosis    pt here to f/u MRI on 9/12 and labs:pt states he is doing well//no cough, sob or wheezing.    HPI:  The patient is a 72 yo male with sarcoidosis. He has been having chronic back pain due to lower back spinal lesions and lung lesions. He had a biopsy of a right hip lesion and underwent a Ct guided biopsy on 04/30/17 which showed granuloma. He has had repeat Ct scan and MRI which have shown no change.   He is currently taking prednisone 5 mg daily, and methotrexate 10 mg weekly. He continues to have back pain, he has  followed up with neurosurgery who recommend non-surgical management.   Patient was noted to have back pain, an MRI showed progressive bone lesions, which have been present since 2013.  He subsequently had a PET scan which showed increased uptake in bone lesions suggestive of metastatic disease.  He then underwent CT guided biopsy of indeterminate lytic lesion involving the posterior aspect of the right ilium, which showed granulomatous disease suggestive of sarcoidosis.  He was asked to start methotrexate, and decreased prednisone to 10 mg daily. He is taking methotrexate now to 7.5 mg once weekly, started 2 months ago. He notes that is the same as always and has not improved on prednisone.   Since his last visit he had RL calf clot, he is on xarelto.   He continues have chronic back pain of 4-5/10. It has progressed from 10 years ago.  **ACE level and metabolic panel 0/96/2836>> ACE level less than 15, liver functions normal. **Liver enzymes 08/20/17>>normal.  **CT chest 09/28/17>> imaging personally reviewed and comparaed with previous on 06/29/17>>; overall CT image is unchanged, with continued presence of multiple bilateral lung nodules, right basal strandy atelectasis or scarring.  There is also mild internal calcification in the masslike opacity in the right lower lobe and mediastinal lymphadenopathy. **MRI L Spine 12/24/17>> multiple bony changes with bulging disc, with potential for-year-old compression, no significant changes from previous scan on 04/01/17.  PET scan radiologist report iMPRESSION: 1. Multiple hypermetabolic pulmonary nodules bilaterally, including a dominant spiculated  lesion in the right lower lobe, likely reflecting primary bronchogenic carcinoma. 2. Associated hypermetabolic mediastinal and bilateral hilar lymph nodes consistent with nodal metastases. 3. Widespread osseous metastatic disease. 4. No definite extra osseous tumor outside of the chest.  Medication:     Current Outpatient Medications:  .  ALPRAZolam (XANAX) 0.5 MG tablet, Take 0.5 mg by mouth daily as needed for anxiety. , Disp: , Rfl:  .  amLODipine (NORVASC) 5 MG tablet, Take 5 mg by mouth daily. , Disp: , Rfl:  .  aspirin 81 MG tablet, Take 81 mg by mouth every morning., Disp: , Rfl:  .  atorvastatin (LIPITOR) 10 MG tablet, Take 10 mg by mouth every morning., Disp: , Rfl:  .  cetirizine (ZYRTEC) 10 MG tablet, Take 10 mg by mouth at bedtime., Disp: , Rfl:  .  citalopram (CELEXA) 40 MG tablet, Take 40 mg by mouth every morning., Disp: , Rfl:  .  clotrimazole (LOTRIMIN) 1 % cream, Apply 1 application topically every other day., Disp: , Rfl:  .  gabapentin (NEURONTIN) 300 MG capsule, Take 300 mg by mouth at bedtime., Disp: , Rfl:  .  hydrocortisone cream 1 %, Apply 1 application topically every other day., Disp: , Rfl:  .  lisinopril-hydrochlorothiazide (PRINZIDE,ZESTORETIC) 20-25 MG tablet, Take 1 tablet by mouth every morning., Disp: , Rfl:  .  metFORMIN (GLUCOPHAGE-XR) 500 MG 24 hr tablet, Take 500 mg by mouth daily with supper. , Disp: , Rfl:  .  methotrexate (RHEUMATREX) 2.5 MG tablet, Take 1 tablet weekly on first week.  During 2nd week take 2 tablets (once weekly).  During 3rd week take 3 tablets (once weekly). Continue taking 3 tablets once weekly until follow up., Disp: 36 tablet, Rfl: 3 .  Multiple Vitamin (MULTIVITAMIN WITH MINERALS) TABS tablet, Take 1 tablet by mouth daily., Disp: , Rfl:  .  naproxen sodium (ALEVE) 220 MG tablet, Take 220 mg by mouth as needed. , Disp: , Rfl:  .  Omega-3 Fatty Acids (FISH OIL) 1000 MG CAPS, Take 1,000 mg by mouth daily., Disp: , Rfl:  .  omeprazole (PRILOSEC) 40 MG capsule, Take 40 mg by mouth every evening., Disp: , Rfl:  .  predniSONE (DELTASONE) 10 MG tablet, Take 1 tablet (10 mg total) by mouth daily with breakfast., Disp: 180 tablet, Rfl: 1 .  rivaroxaban (XARELTO) 20 MG TABS tablet, Take 20 mg by mouth every morning., Disp: , Rfl:  .   rOPINIRole (REQUIP) 1 MG tablet, Take 3 mg by mouth at bedtime. , Disp: , Rfl:  .  tiZANidine (ZANAFLEX) 2 MG tablet, Take 2 mg by mouth every 6 (six) hours as needed for muscle spasms., Disp: , Rfl:  .  traZODone (DESYREL) 100 MG tablet, Take 100 mg by mouth at bedtime., Disp: , Rfl:    Allergies:  Penicillins  Review of Systems  Constitutional: Negative for chills and fever.  HENT: Negative for hearing loss and tinnitus.   Eyes: Negative for blurred vision and double vision.  Respiratory: Negative for shortness of breath and wheezing.   Cardiovascular: Negative for chest pain and palpitations.  Gastrointestinal: Negative for blood in stool and melena.  Genitourinary: Negative for flank pain and hematuria.  Musculoskeletal: Negative for falls and joint pain.  Skin: Negative for itching and rash.  Neurological: Negative for seizures and loss of consciousness.  Psychiatric/Behavioral: Negative for memory loss. The patient does not have insomnia.        LABORATORY PANEL:   CBC No results  for input(s): WBC, HGB, HCT, PLT in the last 168 hours. ------------------------------------------------------------------------------------------------------------------  Chemistries  Recent Labs  Lab 01/04/18 1459  NA 137  K 3.9  CL 99  CO2 27  GLUCOSE 122*  BUN 24*  CREATININE 0.92  CALCIUM 9.4  AST 23  ALT 24  ALKPHOS 78  BILITOT 1.0   ------------------------------------------------------------------------------------------------------------------  Cardiac Enzymes No results for input(s): TROPONINI in the last 168 hours. ------------------------------------------------------------  RADIOLOGY:  No results found.     Thank  you for the consultation and for allowing [facility] to assist in the care of your patient. Our recommendations are noted above.  Please contact us if we can be of further service.  Marda Stalker, M.D., F.C.C.P.  Board  Certified in Internal Medicine, Pulmonary Medicine, [place], and Sleep Medicine.  [facility] Office Number: [phone]   01/07/2018

## 2018-01-15 DIAGNOSIS — E119 Type 2 diabetes mellitus without complications: Secondary | ICD-10-CM | POA: Diagnosis not present

## 2018-01-15 DIAGNOSIS — I10 Essential (primary) hypertension: Secondary | ICD-10-CM | POA: Diagnosis not present

## 2018-01-15 DIAGNOSIS — E7800 Pure hypercholesterolemia, unspecified: Secondary | ICD-10-CM | POA: Diagnosis not present

## 2018-01-20 DIAGNOSIS — D485 Neoplasm of uncertain behavior of skin: Secondary | ICD-10-CM | POA: Diagnosis not present

## 2018-01-20 DIAGNOSIS — D1800 Hemangioma unspecified site: Secondary | ICD-10-CM | POA: Diagnosis not present

## 2018-01-20 DIAGNOSIS — L820 Inflamed seborrheic keratosis: Secondary | ICD-10-CM | POA: Diagnosis not present

## 2018-01-20 DIAGNOSIS — D225 Melanocytic nevi of trunk: Secondary | ICD-10-CM | POA: Diagnosis not present

## 2018-01-20 DIAGNOSIS — L219 Seborrheic dermatitis, unspecified: Secondary | ICD-10-CM | POA: Diagnosis not present

## 2018-01-20 DIAGNOSIS — I8310 Varicose veins of unspecified lower extremity with inflammation: Secondary | ICD-10-CM | POA: Diagnosis not present

## 2018-01-20 DIAGNOSIS — Z1283 Encounter for screening for malignant neoplasm of skin: Secondary | ICD-10-CM | POA: Diagnosis not present

## 2018-01-20 DIAGNOSIS — D224 Melanocytic nevi of scalp and neck: Secondary | ICD-10-CM | POA: Diagnosis not present

## 2018-01-20 DIAGNOSIS — L821 Other seborrheic keratosis: Secondary | ICD-10-CM | POA: Diagnosis not present

## 2018-01-20 DIAGNOSIS — L719 Rosacea, unspecified: Secondary | ICD-10-CM | POA: Diagnosis not present

## 2018-01-21 DIAGNOSIS — I824Y1 Acute embolism and thrombosis of unspecified deep veins of right proximal lower extremity: Secondary | ICD-10-CM | POA: Diagnosis not present

## 2018-01-21 DIAGNOSIS — E7800 Pure hypercholesterolemia, unspecified: Secondary | ICD-10-CM | POA: Diagnosis not present

## 2018-01-21 DIAGNOSIS — E118 Type 2 diabetes mellitus with unspecified complications: Secondary | ICD-10-CM | POA: Diagnosis not present

## 2018-01-21 DIAGNOSIS — I10 Essential (primary) hypertension: Secondary | ICD-10-CM | POA: Diagnosis not present

## 2018-01-21 DIAGNOSIS — F325 Major depressive disorder, single episode, in full remission: Secondary | ICD-10-CM | POA: Diagnosis not present

## 2018-02-02 DIAGNOSIS — R21 Rash and other nonspecific skin eruption: Secondary | ICD-10-CM | POA: Diagnosis not present

## 2018-03-01 DIAGNOSIS — M25511 Pain in right shoulder: Secondary | ICD-10-CM | POA: Diagnosis not present

## 2018-03-01 DIAGNOSIS — M25512 Pain in left shoulder: Secondary | ICD-10-CM | POA: Diagnosis not present

## 2018-03-01 DIAGNOSIS — G8929 Other chronic pain: Secondary | ICD-10-CM | POA: Diagnosis not present

## 2018-03-01 DIAGNOSIS — M7582 Other shoulder lesions, left shoulder: Secondary | ICD-10-CM | POA: Diagnosis not present

## 2018-03-01 DIAGNOSIS — S63502D Unspecified sprain of left wrist, subsequent encounter: Secondary | ICD-10-CM | POA: Diagnosis not present

## 2018-03-02 ENCOUNTER — Other Ambulatory Visit: Payer: Self-pay | Admitting: Surgery

## 2018-03-02 DIAGNOSIS — M25512 Pain in left shoulder: Secondary | ICD-10-CM

## 2018-03-02 DIAGNOSIS — M7582 Other shoulder lesions, left shoulder: Secondary | ICD-10-CM

## 2018-03-02 DIAGNOSIS — S63502D Unspecified sprain of left wrist, subsequent encounter: Secondary | ICD-10-CM

## 2018-03-15 DIAGNOSIS — G2581 Restless legs syndrome: Secondary | ICD-10-CM | POA: Diagnosis not present

## 2018-03-19 DIAGNOSIS — M25561 Pain in right knee: Secondary | ICD-10-CM | POA: Diagnosis not present

## 2018-03-19 DIAGNOSIS — M1711 Unilateral primary osteoarthritis, right knee: Secondary | ICD-10-CM | POA: Diagnosis not present

## 2018-03-20 ENCOUNTER — Ambulatory Visit
Admission: RE | Admit: 2018-03-20 | Discharge: 2018-03-20 | Disposition: A | Discharge status: Home / Self Care | Payer: Medicare HMO | Source: Ambulatory Visit | Attending: Surgery | Admitting: Surgery

## 2018-03-20 DIAGNOSIS — S63502D Unspecified sprain of left wrist, subsequent encounter: Secondary | ICD-10-CM

## 2018-03-20 DIAGNOSIS — S63512A Sprain of carpal joint of left wrist, initial encounter: Secondary | ICD-10-CM | POA: Diagnosis not present

## 2018-03-20 DIAGNOSIS — M7582 Other shoulder lesions, left shoulder: Secondary | ICD-10-CM | POA: Diagnosis not present

## 2018-03-20 DIAGNOSIS — R600 Localized edema: Secondary | ICD-10-CM | POA: Diagnosis not present

## 2018-03-20 DIAGNOSIS — M19012 Primary osteoarthritis, left shoulder: Secondary | ICD-10-CM | POA: Diagnosis not present

## 2018-03-20 DIAGNOSIS — M25512 Pain in left shoulder: Secondary | ICD-10-CM

## 2018-03-22 DIAGNOSIS — S63502D Unspecified sprain of left wrist, subsequent encounter: Secondary | ICD-10-CM | POA: Diagnosis not present

## 2018-03-22 DIAGNOSIS — M7582 Other shoulder lesions, left shoulder: Secondary | ICD-10-CM | POA: Diagnosis not present

## 2018-03-22 DIAGNOSIS — M19132 Post-traumatic osteoarthritis, left wrist: Secondary | ICD-10-CM | POA: Diagnosis not present

## 2018-03-23 ENCOUNTER — Encounter: Payer: Self-pay | Admitting: Student in an Organized Health Care Education/Training Program

## 2018-03-23 ENCOUNTER — Other Ambulatory Visit: Payer: Medicare HMO

## 2018-03-23 DIAGNOSIS — I10 Essential (primary) hypertension: Secondary | ICD-10-CM | POA: Diagnosis not present

## 2018-03-23 DIAGNOSIS — E118 Type 2 diabetes mellitus with unspecified complications: Secondary | ICD-10-CM | POA: Diagnosis not present

## 2018-03-23 DIAGNOSIS — I824Y1 Acute embolism and thrombosis of unspecified deep veins of right proximal lower extremity: Secondary | ICD-10-CM | POA: Diagnosis not present

## 2018-03-24 ENCOUNTER — Encounter: Payer: Self-pay | Admitting: Student in an Organized Health Care Education/Training Program

## 2018-03-25 ENCOUNTER — Ambulatory Visit
Payer: Medicare HMO | Attending: Student in an Organized Health Care Education/Training Program | Admitting: Student in an Organized Health Care Education/Training Program

## 2018-03-25 ENCOUNTER — Other Ambulatory Visit: Payer: Self-pay

## 2018-03-25 ENCOUNTER — Encounter: Payer: Self-pay | Admitting: Student in an Organized Health Care Education/Training Program

## 2018-03-25 VITALS — BP 145/68 | HR 59 | Temp 97.9°F | Resp 18 | Ht 72.0 in | Wt 240.0 lb

## 2018-03-25 DIAGNOSIS — M47816 Spondylosis without myelopathy or radiculopathy, lumbar region: Secondary | ICD-10-CM | POA: Insufficient documentation

## 2018-03-25 DIAGNOSIS — I10 Essential (primary) hypertension: Secondary | ICD-10-CM | POA: Insufficient documentation

## 2018-03-25 DIAGNOSIS — M5416 Radiculopathy, lumbar region: Secondary | ICD-10-CM | POA: Diagnosis not present

## 2018-03-25 DIAGNOSIS — Z9889 Other specified postprocedural states: Secondary | ICD-10-CM | POA: Insufficient documentation

## 2018-03-25 DIAGNOSIS — M545 Low back pain: Secondary | ICD-10-CM | POA: Diagnosis present

## 2018-03-25 DIAGNOSIS — F325 Major depressive disorder, single episode, in full remission: Secondary | ICD-10-CM | POA: Insufficient documentation

## 2018-03-25 DIAGNOSIS — E119 Type 2 diabetes mellitus without complications: Secondary | ICD-10-CM | POA: Diagnosis not present

## 2018-03-25 DIAGNOSIS — Z7982 Long term (current) use of aspirin: Secondary | ICD-10-CM | POA: Insufficient documentation

## 2018-03-25 DIAGNOSIS — K219 Gastro-esophageal reflux disease without esophagitis: Secondary | ICD-10-CM | POA: Insufficient documentation

## 2018-03-25 DIAGNOSIS — G4730 Sleep apnea, unspecified: Secondary | ICD-10-CM | POA: Diagnosis not present

## 2018-03-25 DIAGNOSIS — E7800 Pure hypercholesterolemia, unspecified: Secondary | ICD-10-CM | POA: Insufficient documentation

## 2018-03-25 DIAGNOSIS — Z880 Allergy status to penicillin: Secondary | ICD-10-CM | POA: Diagnosis not present

## 2018-03-25 DIAGNOSIS — M48061 Spinal stenosis, lumbar region without neurogenic claudication: Secondary | ICD-10-CM | POA: Diagnosis not present

## 2018-03-25 DIAGNOSIS — M7711 Lateral epicondylitis, right elbow: Secondary | ICD-10-CM | POA: Diagnosis not present

## 2018-03-25 DIAGNOSIS — G894 Chronic pain syndrome: Secondary | ICD-10-CM | POA: Diagnosis not present

## 2018-03-25 DIAGNOSIS — Z79899 Other long term (current) drug therapy: Secondary | ICD-10-CM | POA: Diagnosis not present

## 2018-03-25 DIAGNOSIS — G2581 Restless legs syndrome: Secondary | ICD-10-CM | POA: Diagnosis not present

## 2018-03-25 DIAGNOSIS — R221 Localized swelling, mass and lump, neck: Secondary | ICD-10-CM | POA: Diagnosis not present

## 2018-03-25 NOTE — Progress Notes (Signed)
Safety precautions to be maintained throughout the outpatient stay will include: orient to surroundings, keep bed in low position, maintain call bell within reach at all times, provide assistance with transfer out of bed and ambulation.  

## 2018-03-25 NOTE — Progress Notes (Signed)
Patient's Name: George Daniels  MRN: [id number]  Referring Provider: Kirk Ruths, MD  DOB: [birth year]  PCP: George Ruths, MD  DOS: 03/25/2018  Note by: George Santa, MD  Service setting: Ambulatory outpatient  Specialty: Interventional Pain Management  Location: [facility] (AMB) Pain Management Facility  Visit type: Initial Patient Evaluation  Patient type: New Patient   Primary Reason(s) for Visit: Encounter for initial evaluation of one or more chronic problems (new to examiner) potentially causing chronic pain, and posing a threat to normal musculoskeletal function. (Level of risk: High) CC: Back Pain (lower)  HPI  Mr. George Daniels is a 72 y.o. year old, male patient, who comes today to see [place] for the first time for an initial evaluation of his chronic pain. He has Benign essential hypertension; Chronic pain in right shoulder; Diabetes mellitus without complication ([place]); Health care maintenance; Hypertension; Lateral epicondylitis of right elbow; Low back pain; Lumbar stenosis; Major depression in remission ([place]); Pain of left sacroiliac joint; Pure hypercholesterolemia; Restless legs syndrome; Sprain of wrist, left; and Spondylosis of lumbar region without myelopathy or radiculopathy on their problem list. Today he comes in for evaluation of his Back Pain (lower)  Pain Assessment: Location: Lower Back Radiating: sometimes through left buttock to back of left thigh Onset: More than a month ago Duration: Chronic pain Quality: Aching, Dull(pain worse when sitting and standing) Severity: 4 /10 (subjective, self-reported pain score)  Note: Reported level is compatible with observation.                         When using our objective Pain Scale, levels between 6 and 10/10 are said to belong in an emergency room, as it progressively worsens from a 6/10, described as severely limiting, requiring emergency care not usually available at an outpatient pain management facility. At a 6/10 level,  communication becomes difficult and requires great effort. Assistance to reach the emergency department may be required. Facial flushing and profuse sweating along with potentially dangerous increases in heart rate and blood pressure will be evident. Effect on ADL: most activity elicits discomfort Timing: Intermittent Modifying factors: reclining, walking BP: (!) 145/68  HR: (!) 59  Onset and Duration: Date of onset: 60 and Present longer than 3 months Cause of pain: Unknown Severity: Getting worse, NAS-11 at its worse: 8/10, NAS-11 at its best: 4/10, NAS-11 now: 6/10 and NAS-11 on the average: 5/10 Timing: Not influenced by the time of the day, During activity or exercise and After activity or exercise Aggravating Factors: Bending, Lifiting, Prolonged sitting, Prolonged standing, Stooping  and Twisting Alleviating Factors: Lying down, Sleeping and Walking Associated Problems: Depression and Weakness Quality of Pain: Aching, Annoying, Constant, Deep and Nagging Previous Examinations or Tests: MRI scan and Orthopedic evaluation Previous Treatments: Chiropractic manipulations, Epidural steroid injections, Facet blocks, Physical Therapy, Stretching exercises and TENS  The patient comes into the clinics today for the first time for a chronic pain management evaluation.   History of axial low back pain that radiates into bilateral buttocks.  Does not usually radiate down his leg.  Started over 25 years ago.  Worse on the left side.  Has tried various therapies including various medications, massage, chiropractor, acupuncture, facet blocks, left L5-S1 and left S1 transforaminal ESI without any significant benefit, left hip injection without any significant benefit.  In terms of medications patient has tried gabapentin, meloxicam, naproxen, Lyrica, Flexeril, prednisone, tramadol, Tylenol, ibuprofen, oxycodone.  He is only interested in interventional pain  management.  Medication management to  continue with primary care provider.  Historical Background Evaluation: [place] PMP: Six (6) year initial data search conducted.               Alcohol: Negative  Illegal Drugs: Negative  Rx Drugs: Negative  ORT Risk Level calculation: Low Risk Opioid Risk Tool - 03/25/18 0825      Family History of Substance Abuse   Alcohol  Negative    Illegal Drugs  Negative    Rx Drugs  Negative      Personal History of Substance Abuse   Alcohol  Negative    Illegal Drugs  Negative    Rx Drugs  Negative      Age   Age between 82-45 years   No      History of Preadolescent Sexual Abuse   History of Preadolescent Sexual Abuse  Negative or Male      Psychological Disease   Psychological Disease  Negative    Depression  Positive      Total Score   Opioid Risk Tool Scoring  1    Opioid Risk Interpretation  Low Risk      ORT Scoring interpretation table:  Score <3 = Low Risk for SUD  Score between 4-7 = Moderate Risk for SUD  Score >8 = High Risk for Opioid Abuse   PHQ-2 Depression Scale:  Total score: 0  PHQ-2 Scoring interpretation table: (Score and probability of major depressive disorder)  Score 0 = No depression  Score 1 = 15.4% Probability  Score 2 = 21.1% Probability  Score 3 = 38.4% Probability  Score 4 = 45.5% Probability  Score 5 = 56.4% Probability  Score 6 = 78.6% Probability   PHQ-9 Depression Scale:  Total score: 0  PHQ-9 Scoring interpretation table:  Score 0-4 = No depression  Score 5-9 = Mild depression  Score 10-14 = Moderate depression  Score 15-19 = Moderately severe depression  Score 20-27 = Severe depression (2.4 times higher risk of SUD and 2.89 times higher risk of overuse)     Meds   Current Outpatient Medications:  .  ALPRAZolam (XANAX) 0.5 MG tablet, Take 0.5 mg by mouth daily as needed for anxiety. , Disp: , Rfl:  .  amLODipine (NORVASC) 5 MG tablet, Take 5 mg by mouth daily. , Disp: , Rfl:  .  aspirin 81 MG tablet, Take 81 mg by mouth every  morning., Disp: , Rfl:  .  atorvastatin (LIPITOR) 10 MG tablet, Take 10 mg by mouth every morning., Disp: , Rfl:  .  citalopram (CELEXA) 40 MG tablet, Take 40 mg by mouth every morning., Disp: , Rfl:  .  clotrimazole (LOTRIMIN) 1 % cream, Apply 1 application topically every other day., Disp: , Rfl:  .  gabapentin (NEURONTIN) 300 MG capsule, Take 300-600 mg by mouth See admin instructions. Take 300 mg by mouth in the morning and evening with dinner and take 600 mg at bedtime., Disp: , Rfl:  .  hydrocortisone cream 1 %, Apply 1 application topically every other day., Disp: , Rfl:  .  lisinopril-hydrochlorothiazide (PRINZIDE,ZESTORETIC) 20-25 MG tablet, Take 1 tablet by mouth every morning., Disp: , Rfl:  .  metFORMIN (GLUCOPHAGE-XR) 500 MG 24 hr tablet, Take 500 mg by mouth daily with supper. , Disp: , Rfl:  .  methotrexate (RHEUMATREX) 2.5 MG tablet, Take 5 tablets (12.5 mg total) by mouth once a week. Caution:Chemotherapy. Protect from light., Disp: 75 tablet, Rfl: 3 .  mometasone (ELOCON) 0.1 % lotion, Apply 1 application topically 2 (two) times a week., Disp: , Rfl:  .  omeprazole (PRILOSEC) 40 MG capsule, Take 40 mg by mouth every evening., Disp: , Rfl:  .  rOPINIRole (REQUIP) 3 MG tablet, Take 3 mg by mouth daily after supper. , Disp: , Rfl:  .  sildenafil (REVATIO) 20 MG tablet, Take 20 mg by mouth daily as needed (ED)., Disp: , Rfl:  .  tiZANidine (ZANAFLEX) 2 MG tablet, Take 2 mg by mouth every 8 (eight) hours as needed for muscle spasms. , Disp: , Rfl:  .  traZODone (DESYREL) 100 MG tablet, Take 100 mg by mouth at bedtime., Disp: , Rfl:  .  predniSONE (DELTASONE) 10 MG tablet, Take 1 tablet (10 mg total) by mouth daily with breakfast. (Patient not taking: Reported on 03/23/2018), Disp: 180 tablet, Rfl: 1  Imaging Review   Shoulder-R MR wo contrast:  Results for orders placed during the hospital encounter of 01/10/15  MR Shoulder Right Wo Contrast   Narrative CLINICAL DATA:  Chronic  progressive right shoulder pain.  EXAM: MRI OF THE RIGHT SHOULDER WITHOUT CONTRAST  TECHNIQUE: Multiplanar, multisequence MR imaging of the shoulder was performed. No intravenous contrast was administered.  COMPARISON:  None.  FINDINGS: Rotator cuff: There are small superficial bursal surface tears of the distal supraspinatus tendon. There is a rim rent tear of the distal infraspinatus tendon seen on image 10 of series 4. There is marked thinning of the distal infraspinatus tendon. Subscapularis and teres minor tendons are intact.  Muscles: Extensive tear in the infraspinatus muscle extending into the musculotendinous junction and along the distal muscle and tendon. The other muscles are normal.  Biceps long head:  Properly located and intact.  Acromioclavicular Joint: Type 1 acromion. No bursitis. Minimal degenerative changes of the [facility] joint.  Glenohumeral Joint: Normal.  Labrum:  Normal.  Bones: There is lesion in the scapula just proximal to the glenoid extending into the body of the scapula. The cortex is slightly thickened and there are small internal septations as well as high signal intensity on T2 weighted imaging and intermediate signal on T1 weighted imaging. I suspect this represents focal Paget's disease. There is slight thickening of the cortex of the spine of the scapula. There is no cortical disruption.  Minimal cystic degenerative changes of the humeral head.  IMPRESSION: 1. Intramuscular tear of the infraspinatus extending through the musculotendinous junction. 2. Rim rent tear of the distal infraspinatus tendon. 3. Superficial bursal surface tear is of the distal supraspinatus tendon. That tendon is diminutive. 4. Probable Paget's disease of the scapula.   Electronically Signed   By: Lorriane Shire M.D.   On: 01/10/2015 15:10    Shoulder-L MR wo contrast:  Results for orders placed during the hospital encounter of 03/20/18  MR SHOULDER LEFT  WO CONTRAST   Narrative CLINICAL DATA:  No known injury, left shoulder pain.  Soreness.  EXAM: MRI OF THE LEFT SHOULDER WITHOUT CONTRAST  TECHNIQUE: Multiplanar, multisequence MR imaging of the shoulder was performed. No intravenous contrast was administered.  COMPARISON:  None.  FINDINGS: Rotator cuff: Mild tendinosis of the supraspinatus tendon with fraying along the bursal surface. Mild tendinosis of the infraspinatus tendon. Teres minor tendon is intact. Subscapularis tendon is intact.  Muscles: No atrophy or fatty replacement of nor abnormal signal within, the muscles of the rotator cuff.  Biceps long head:  Intact.  Acromioclavicular Joint: Moderate arthropathy of the acromioclavicular joint with marrow edema  on either side of the joint. Type I acromion. No subacromial/subdeltoid bursal fluid.  Glenohumeral Joint: No joint effusion. No chondral defect.  Labrum: Grossly intact, but evaluation is limited by lack of intraarticular fluid.  Bones:  No marrow abnormality, fracture or dislocation.  Other: No fluid collection or hematoma.  IMPRESSION: 1. Mild tendinosis of the supraspinatus tendon with fraying along the bursal surface. 2. Mild tendinosis of the infraspinatus tendon. 3. Moderate arthropathy of the acromioclavicular joint with marrow edema on either side of the joint.   Electronically Signed   By: Kathreen Devoid   On: 03/21/2018 09:42      Lumbosacral Imaging: Lumbar MR wo contrast:  Results for orders placed during the hospital encounter of 04/01/17  MR LUMBAR SPINE WO CONTRAST   Narrative CLINICAL DATA:  Chronic low back pain and left leg pain.  EXAM: MRI LUMBAR SPINE WITHOUT CONTRAST  TECHNIQUE: Multiplanar, multisequence MR imaging of the lumbar spine was performed. No intravenous contrast was administered.  COMPARISON:  Radiographs dated 05/30/2014 and MRI dated 05/21/2011  FINDINGS: Segmentation:  Standard.  Alignment: Minimal  retrolisthesis of L1 on L2 and of L2 on L3. Minimal anterolisthesis of L4 on L5.  Vertebrae: There has been marked progression in the size of all of the lesions demonstrated on the prior exam of 05/21/2011. In addition, there are new lesions in T12, L4, L5, and S3.  The previously demonstrated lesions in L1, L2, L3, L4, and S1 have all enlarged. There is no pathologic fracture or cortical disruption. No extension of the lesions into the soft tissues or spinal canal.  Conus medullaris and cauda equina: Conus extends to the L1 level. Conus and cauda equina appear normal.  Paraspinal and other soft tissues: Bilateral benign-appearing renal cysts. Otherwise negative.  Disc levels:  T12-L1:  Normal disc.  No neural impingement.  L1-2: New disc desiccation and disc space narrowing with a small broad-based disc bulge with accompanying osteophytes without neural impingement. Marked progression of bone lesions in the vertebral bodies extending into the posterior elements of L1 and L2.  L2-3: New disc desiccation and disc space narrowing with a small broad-based disc bulge with accompanying osteophytes with a small soft disc protrusion into the left neural foramen. The diffuse disc bulge creates moderate compression of the thecal sac without focal neural impingement. Slight hypertrophy of the ligamentum flavum and facet joints. Progressive bone lesions in the L3 vertebra extending into the left side of the posterior elements.  L3-4: New disc desiccation. New small broad-based disc bulge without neural impingement. Small protrusion into the lateral aspect of the left neural foramen which posteriorly deviates the left L3 nerve lateral to the neural foramen, best seen on image 23 of series 5. Progression of bone lesions in L4.  L4-5: New disc desiccation with a small broad-based disc bulge. Progressive now severe bilateral facet arthritis with increased ligamentum flavum hypertrophy  combining with the disc bulge to create severe spinal stenosis and moderately severe bilateral foraminal stenosis. New small lesion in the superior aspect of the L5 vertebral body.  L5-S1: New slight disc bulge central and to the left. Moderately severe bilateral facet arthritis without focal neural impingement. Marked progression of bone lesion in the S1 segment.  Sacrum: New lesions in S2. Lesion and S3. Possible small lesion in the right ilium seen on image 38 of series 5.  IMPRESSION: 1. Marked progression of numerous bone lesions consistent with metastatic disease. Multiple new lesions as described. Given the almost 6 year  time interval between the 2 MRI scans, I suspect that the most likely diagnosis is prostate cancer. 2. New severe spinal stenosis at L4-5 due to a combination of hypertrophy of the ligamentum flavum and facet joints and a broad-based disc bulge. 3. Chronic progressive severe bilateral facet arthritis at L5-S1 without focal neural impingement.   Electronically Signed   By: Lorriane Shire M.D.   On: 04/02/2017 10:39    Lumbar MR w/wo contrast:  Results for orders placed during the hospital encounter of 12/24/17  MR Lumbar Spine W Wo Contrast   Narrative CLINICAL DATA:  Low back pain for many years. Recent diagnosis of sarcoid. History of left leg pain.  EXAM: MRI LUMBAR SPINE WITHOUT AND WITH CONTRAST  TECHNIQUE: Multiplanar and multiecho pulse sequences of the lumbar spine were obtained without and with intravenous contrast.  CONTRAST:  55m MULTIHANCE GADOBENATE DIMEGLUMINE 529 MG/ML IV SOLN  COMPARISON:  04/01/2017  FINDINGS: Segmentation: 5 lumbar type vertebral bodies as numbered previously.  Alignment:  2 mm of degenerative anterolisthesis L4-5 and L5-S1.  Vertebrae: Involution of the multiple bone lesions seen previously, consistent with treated disease, presumably sarcoid. Low level signal abnormality does persist scattered throughout  the spine and pelvis. No new or progressive lesions are seen. No evidence of any extraosseous extension or component.  Conus medullaris and cauda equina: Conus extends to the L1 level. Conus and cauda equina appear normal.  Paraspinal and other soft tissues: Renal cysts as seen previously.  Disc levels:  T12-L1: Normal.  L1-2: Endplate osteophytes and bulging of the disc. No compressive stenosis.  L2-3: Endplate osteophytes and bulging of the disc. Facet and ligamentous prominence. Dorsal epidural fat. Constriction of the thecal sac with effacement of the subarachnoid space but no definite neural compression.  L3-4: Bulging of the disc with left foraminal to extraforaminal osteophytes and protruding disc material. Facet and ligamentous prominence. Dorsal epidural fat. No compressive central canal stenosis. Some potential the left L3 nerve could be affected, but the appearance is unchanged.  L4-5: Bilateral facet arthropathy with 2 mm of anterolisthesis. Circumferential bulging of the disc. Prominent dorsal epidural fat. Multifactorial spinal stenosis that could cause neural compression on either or both sides.  L5-S1: Bilateral facet arthropathy with 2 mm of anterolisthesis. Bulging of the disc more towards the left. Narrowing of the subarticular lateral recesses and foramina left more than right neural compression could occur, particularly on the left. I think there is a synovial cyst projecting inward from the facet on the left that also contributes to potential compression of the left S1 nerve.  IMPRESSION: Involution of the bony changes presumably secondary to sarcoid of bone. No new or progressive findings. Low level changes due persist.  L3-4: Left foraminal to extraforaminal encroachment by osteophyte and bulging disc material could possibly affect the left L3 nerve. This appears similar to the previous study.  L4-5: Multifactorial stenosis that could cause  neural compression, more likely on the left. This appears similar to the previous study.  L5-S1: Lateral recess and foraminal stenosis left worse than right that could cause neural compression, similar to the previous study. Additionally, there is a synovial cyst projecting into the inferior left lateral recess that would have potential to affect the left S1 nerve, also unchanged.   Electronically Signed   By: MNelson ChimesM.D.   On: 12/24/2017 16:43     Lumbar DG 2-3 views:  Results for orders placed during the hospital encounter of 03/20/11  DG Lumbar Spine 2-3  Views   Narrative *RADIOLOGY REPORT*  Clinical Data: Low back and left leg pain.  LUMBAR SPINE - 2-3 VIEW  Comparison: None  Findings: The lumbar vertebral bodies are normally aligned except for minimal anterolisthesis of L5.  No obvious pars defect.  The intervertebral disc spaces are maintained.  No acute bony. No instability or subluxation with flexion/extension.  IMPRESSION: No acute bony findings.  Non isthmic spondylolisthesis at L5 appears stable.  Original Report Authenticated By: P. Kalman Jewels, M.D.    CT-Guided Biopsy:  Results for orders placed during the hospital encounter of 04/30/17  CT Biopsy   Narrative INDICATION: No known primary, now with multiple hypermetabolic osseous lesions worrisome for metastatic disease.  Please perform CT-guided biopsy for tissue diagnostic purposes.  EXAM: CT-GUIDED BONE LESION BIOPSY  MEDICATIONS: None  ANESTHESIA/SEDATION: Fentanyl 75 mcg IV; Versed 3 mg IV  Sedation Time: 16 Minutes; The patient was continuously monitored during the procedure by the interventional radiology nurse under my direct supervision.  COMPLICATIONS: None immediate.  PROCEDURE: Informed consent was obtained from the patient following an explanation of the procedure, risks, benefits and alternatives. The patient understands, agrees and consents for the procedure.  All questions were addressed. A time out was performed prior to the initiation of the procedure. The patient was positioned prone and non-contrast localization CT was performed of the pelvis to demonstrate the known approximately 2.9 x 2.0 cm hypermetabolic lytic lesion involving the posterior aspect the right ilium (16, series 2). The operative site was prepped and draped in the usual sterile fashion.  Under sterile conditions and local anesthesia, a 22 gauge spinal needle was utilized for procedural planning. Next, an 11 gauge coaxial bone biopsy needle was advanced into peripheral aspect of the hypermetabolic lytic lesion. Needle position was confirmed with CT imaging. Initial sample was acquired with the inner 13 gauge trocar biopsy device yielding predominantly clot. Next, the outer 11 gauge coaxial bone biopsy needle was utilized to acquire a visually excellent sample. As such, the 11 gauge coaxial bone needle biopsy device was re-advanced into the peripheral aspect of the lytic lesion. Appropriate positioned was confirmed and an additional visually excellent sample was obtained.  The needle was removed intact. Hemostasis was obtained with compression and a dressing was placed. The patient tolerated the procedure well without immediate post procedural complication.  IMPRESSION: Technically successful CT guided biopsy of indeterminate lytic lesion involving the posterior aspect of the right ilium.   Electronically Signed   By: Sandi Mariscal M.D.   On: 04/30/2017 11:58      Complexity Note: Imaging results reviewed. Results shared with Mr. Pruiett, using Layman's terms.                         ROS  Cardiovascular: Daily Aspirin intake and High blood pressure Pulmonary or Respiratory: Snoring  Neurological: No reported neurological signs or symptoms such as seizures, abnormal skin sensations, urinary and/or fecal incontinence, being born with an abnormal open spine and/or  a tethered spinal cord Review of Past Neurological Studies:  Results for orders placed or performed during the hospital encounter of 04/21/17  MR Brain W Wo Contrast   Narrative   CLINICAL DATA:  Lung mass.  Assessment for metastases.  EXAM: MRI HEAD WITHOUT AND WITH CONTRAST  TECHNIQUE: Multiplanar, multiecho pulse sequences of the brain and surrounding structures were obtained without and with intravenous contrast.  CONTRAST:  84m MULTIHANCE GADOBENATE DIMEGLUMINE 529 MG/ML IV SOLN  COMPARISON:  None.  FINDINGS: Brain: The midline structures are normal. There is no acute infarct or acute hemorrhage. No mass lesion, hydrocephalus, dural abnormality or extra-axial collection. The brain parenchymal signal is normal. No age-advanced or lobar predominant atrophy. No chronic microhemorrhage or superficial siderosis.  Vascular: Major intracranial arterial and venous sinus flow voids are preserved.  Skull and upper cervical spine: The visualized skull base, calvarium, upper cervical spine and extracranial soft tissues are normal.  Sinuses/Orbits: No fluid levels or advanced mucosal thickening. No mastoid or middle ear effusion. Normal orbits.  IMPRESSION: Normal aging brain. No intracranial or calvarial metastatic disease.   Electronically Signed   By: Ulyses Jarred M.D.   On: 04/22/2017 06:27    Psychological-Psychiatric: Depressed Gastrointestinal: No reported gastrointestinal signs or symptoms such as vomiting or evacuating blood, reflux, heartburn, alternating episodes of diarrhea and constipation, inflamed or scarred liver, or pancreas or irrregular and/or infrequent bowel movements Genitourinary: No reported renal or genitourinary signs or symptoms such as difficulty voiding or producing urine, peeing blood, non-functioning kidney, kidney stones, difficulty emptying the bladder, difficulty controlling the flow of urine, or chronic kidney disease Hematological: No  reported hematological signs or symptoms such as prolonged bleeding, low or poor functioning platelets, bruising or bleeding easily, hereditary bleeding problems, low energy levels due to low hemoglobin or being anemic Endocrine: High blood sugar controlled without the use of insulin (NIDDM) Rheumatologic: No reported rheumatological signs and symptoms such as fatigue, joint pain, tenderness, swelling, redness, heat, stiffness, decreased range of motion, with or without associated rash Musculoskeletal: Negative for myasthenia gravis, muscular dystrophy, multiple sclerosis or malignant hyperthermia Work History: Retired  Allergies  Mr. Dudash is allergic to penicillins.  Laboratory Chemistry  Inflammation Markers (CRP: Acute Phase) (ESR: Chronic Phase) No results found for: CRP, ESRSEDRATE, LATICACIDVEN                       Rheumatology Markers No results found for: RF, ANA, LABURIC, URICUR, LYMEIGGIGMAB, LYMEABIGMQN, HLAB27                      Renal Function Markers Lab Results  Component Value Date   BUN 24 (H) 01/04/2018   CREATININE 0.92 01/04/2018   GFRAA >60 01/04/2018   GFRNONAA >60 01/04/2018                             Hepatic Function Markers Lab Results  Component Value Date   AST 23 01/04/2018   ALT 24 01/04/2018   ALBUMIN 4.5 01/04/2018   ALKPHOS 78 01/04/2018                        Electrolytes Lab Results  Component Value Date   NA 137 01/04/2018   K 3.9 01/04/2018   CL 99 01/04/2018   CALCIUM 9.4 01/04/2018                        Neuropathy Markers No results found for: VITAMINB12, FOLATE, HGBA1C, HIV                      CNS Tests No results found for: COLORCSF, APPEARCSF, RBCCOUNTCSF, WBCCSF, POLYSCSF, LYMPHSCSF, EOSCSF, PROTEINCSF, GLUCCSF, JCVIRUS, CSFOLI, IGGCSF                      Bone Pathology Markers Lab Results  Component Value Date   TESTOSTERONE 237 (L) 04/03/2017                         Coagulation Parameters Lab Results   Component Value Date   INR 1.05 04/30/2017   LABPROT 13.6 04/30/2017   APTT 30 04/30/2017   PLT 158 04/30/2017                        Cardiovascular Markers Lab Results  Component Value Date   HGB 14.4 04/30/2017   HCT 42.0 04/30/2017                         CA Markers No results found for: CEA, CA125, LABCA2                      Note: Lab results reviewed.  [place]  Drug: Mr. Kabler  reports no history of drug use. Alcohol:  reports current alcohol use. Tobacco:  reports that he has never smoked. He has never used smokeless tobacco. Medical:  has a past medical history of Arthritis, Complication of anesthesia, Depression, Diabetes mellitus without complication ([place]), Family history of adverse reaction to anesthesia, GERD (gastroesophageal reflux disease), Hypertension, Nausea, RLS (restless legs syndrome), and Sleep apnea. Family: family history is not on file.  Past Surgical History:  Procedure Laterality Date  . ACHILLES TENDON REPAIR Right 2014  . SHOULDER ARTHROSCOPY WITH OPEN ROTATOR CUFF REPAIR Right 08/02/2015   Procedure: SHOULDER ARTHROSCOPY WITH LIMITED DEBRIDEMENT,  MINI OPEN ROTATOR CUFF REPAIR, DECOMPRESSION;  Surgeon: Corky Mull, MD;  Location: [facility];  Service: Orthopedics;  Laterality: Right;  . TENNIS ELBOW RELEASE/NIRSCHEL PROCEDURE Right 10/02/2016   Procedure: TENNIS ELBOW / OPEN DEBRIDMENT OF THE COMMON EXTENSOR ORGIN OF RIGHT ELBOW;  Surgeon: Corky Mull, MD;  Location: [facility];  Service: Orthopedics;  Laterality: Right;  . TONSILLECTOMY     Active Ambulatory Problems    Diagnosis Date Noted  . Benign essential hypertension 05/30/2014  . Chronic pain in right shoulder 12/29/2014  . Diabetes mellitus without complication ([place]) 29/56/2130  . Health care maintenance 05/14/2016  . Hypertension 12/09/2013  . Lateral epicondylitis of right elbow 08/15/2016  . Low back pain 12/09/2013  . Lumbar stenosis 12/28/2013  . Major depression in remission  ([place]) 05/30/2014  . Pain of left sacroiliac joint 12/09/2013  . Pure hypercholesterolemia 12/29/2014  . Restless legs syndrome 12/07/2015  . Sprain of wrist, left 11/10/2016  . Spondylosis of lumbar region without myelopathy or radiculopathy 12/28/2013   Resolved Ambulatory Problems    Diagnosis Date Noted  . No Resolved Ambulatory Problems   Past Medical History:  Diagnosis Date  . Arthritis   . Complication of anesthesia   . Depression   . Family history of adverse reaction to anesthesia   . GERD (gastroesophageal reflux disease)   . Nausea   . RLS (restless legs syndrome)   . Sleep apnea    Constitutional Exam  General appearance: Well nourished, well developed, and well hydrated. In no apparent acute distress Vitals:   03/25/18 0804  BP: (!) 145/68  Pulse: (!) 59  Resp: 18  Temp: 97.9 F (36.6 C)  TempSrc: Oral  SpO2: 97%  Weight: 240 lb (108.9 kg)  Height: 6' (1.829 m)   BMI Assessment: Estimated body mass index is 32.55 kg/m as calculated from the following:   Height as of  this encounter: 6' (1.829 m).   Weight as of this encounter: 240 lb (108.9 kg).  BMI interpretation table: BMI level Category Range association with higher incidence of chronic pain  <18 kg/m2 Underweight   18.5-24.9 kg/m2 Ideal body weight   25-29.9 kg/m2 Overweight Increased incidence by 20%  30-34.9 kg/m2 Obese (Class I) Increased incidence by 68%  35-39.9 kg/m2 Severe obesity (Class II) Increased incidence by 136%  >40 kg/m2 Extreme obesity (Class III) Increased incidence by 254%   Patient's current BMI Ideal Body weight  Body mass index is 32.55 kg/m. Ideal body weight: 77.6 kg (171 lb 1.2 oz) Adjusted ideal body weight: 90.1 kg (198 lb 10.3 oz)   BMI Readings from Last 4 Encounters:  03/25/18 32.55 kg/m  01/07/18 32.41 kg/m  10/06/17 32.41 kg/m  09/09/17 31.87 kg/m   Wt Readings from Last 4 Encounters:  03/25/18 240 lb (108.9 kg)  01/07/18 239 lb (108.4 kg)  10/06/17  239 lb (108.4 kg)  09/09/17 235 lb (106.6 kg)  Psych/Mental status: Alert, oriented x 3 (person, place, & time)       Eyes: PERLA Respiratory: No evidence of acute respiratory distress  Cervical Spine Area Exam  Skin & Axial Inspection: No masses, redness, edema, swelling, or associated skin lesions Alignment: Symmetrical Functional ROM: Unrestricted ROM      Stability: No instability detected Muscle Tone/Strength: Functionally intact. No obvious neuro-muscular anomalies detected. Sensory (Neurological): Unimpaired Palpation: No palpable anomalies              Upper Extremity (UE) Exam    Side: Right upper extremity  Side: Left upper extremity  Skin & Extremity Inspection: Skin color, temperature, and hair growth are WNL. No peripheral edema or cyanosis. No masses, redness, swelling, asymmetry, or associated skin lesions. No contractures.  Skin & Extremity Inspection: Skin color, temperature, and hair growth are WNL. No peripheral edema or cyanosis. No masses, redness, swelling, asymmetry, or associated skin lesions. No contractures.  Functional ROM: Unrestricted ROM          Functional ROM: Unrestricted ROM          Muscle Tone/Strength: Functionally intact. No obvious neuro-muscular anomalies detected.  Muscle Tone/Strength: Functionally intact. No obvious neuro-muscular anomalies detected.  Sensory (Neurological): Unimpaired          Sensory (Neurological): Unimpaired          Palpation: No palpable anomalies              Palpation: No palpable anomalies              Provocative Test(s):  Phalen's test: deferred Tinel's test: deferred Apley's scratch test (touch opposite shoulder):  Action 1 (Across chest): deferred Action 2 (Overhead): deferred Action 3 (LB reach): deferred   Provocative Test(s):  Phalen's test: deferred Tinel's test: deferred Apley's scratch test (touch opposite shoulder):  Action 1 (Across chest): deferred Action 2 (Overhead): deferred Action 3 (LB reach):  deferred    Thoracic Spine Area Exam  Skin & Axial Inspection: No masses, redness, or swelling Alignment: Symmetrical Functional ROM: Unrestricted ROM Stability: No instability detected Muscle Tone/Strength: Functionally intact. No obvious neuro-muscular anomalies detected. Sensory (Neurological): Unimpaired Muscle strength & Tone: No palpable anomalies  Lumbar Spine Area Exam  Skin & Axial Inspection: No masses, redness, or swelling Alignment: Symmetrical Functional ROM: Decreased ROM       Stability: No instability detected Muscle Tone/Strength: Functionally intact. No obvious neuro-muscular anomalies detected. Sensory (Neurological): Musculoskeletal pain pattern and possibly dermatomal Palpation:  No palpable anomalies       Provocative Tests: Hyperextension/rotation test: deferred today       Lumbar quadrant test (Kemp's test): deferred today       Lateral bending test: deferred today       Patrick's Maneuver: deferred today                   FABER* test: deferred today                   S-I anterior distraction/compression test: deferred today         S-I lateral compression test: deferred today         S-I Thigh-thrust test: deferred today         S-I Gaenslen's test: deferred today         *(Flexion, ABduction and External Rotation)  Gait & Posture Assessment  Ambulation: Unassisted Gait: Relatively normal for age and body habitus Posture: WNL   Lower Extremity Exam    Side: Right lower extremity  Side: Left lower extremity  Stability: No instability observed          Stability: No instability observed          Skin & Extremity Inspection: Skin color, temperature, and hair growth are WNL. No peripheral edema or cyanosis. No masses, redness, swelling, asymmetry, or associated skin lesions. No contractures.  Skin & Extremity Inspection: Skin color, temperature, and hair growth are WNL. No peripheral edema or cyanosis. No masses, redness, swelling, asymmetry, or associated  skin lesions. No contractures.  Functional ROM: Unrestricted ROM                  Functional ROM: Unrestricted ROM                  Muscle Tone/Strength: Functionally intact. No obvious neuro-muscular anomalies detected.  Muscle Tone/Strength: Functionally intact. No obvious neuro-muscular anomalies detected.  Sensory (Neurological): Unimpaired        Sensory (Neurological): Unimpaired        DTR: Patellar: 2+: normal Achilles: 2+: normal Plantar: deferred today  DTR: Patellar: 2+: normal Achilles: 1+: trace Plantar: deferred today  Palpation: No palpable anomalies  Palpation: No palpable anomalies   Assessment  Primary Diagnosis & Pertinent Problem List: The primary encounter diagnosis was Lumbar radiculopathy. Diagnoses of Lumbar foraminal stenosis and Chronic pain syndrome were also pertinent to this visit.  Visit Diagnosis (New problems to examiner): 1. Lumbar radiculopathy   2. Lumbar foraminal stenosis   3. Chronic pain syndrome    72 year old male who presents with a chief complaint of axial low back pain with occasional radiation into bilateral buttocks and hips.  Patient is tried and failed various interventional and medical treatment modalities as listed above.  Today we discussed spinal cord stimulation.  Generally spinal cord stimulation is not as effective for axial low back pain compared to her radicular pain.  I provided the patient with resources and he states that he will think about this treatment modality further.  An alternative that we discussed was performing an interlaminar epidural steroid injection at L3-L4 as the patient does have multilevel lumbar foraminal stenosis and multilevel lumbar nerve compression.  Risks and benefits of this procedure were discussed and patient would like to proceed.  Plan: -Left L3-L4 ESI without sedation.  Patient must stop Xarelto 7 days prior to scheduled procedure.  Future considerations: SCS  Ordered Lab-work, Procedure(s),  Referral(s), & Consult(s): Orders Placed This Encounter  Procedures  .  Lumbar Epidural Injection     Interventional management options: Mr. Texidor was informed that there is no guarantee that he would be a candidate for interventional therapies. The decision will be based on the results of diagnostic studies, as well as Mr. Krupka risk profile.  Procedure(s) under consideration:  Interlaminar lumbar epidural steroid injection Spinal cord stimulation   Provider-requested follow-up: Return in about 4 weeks (around 04/22/2018) for Procedure, Blood Thinner Protocol.  Future Appointments  Date Time Provider [place]  03/26/2018  1:00 PM [facility] None  04/20/2018  1:30 PM Laverle Hobby, MD [facility] None    Primary Care Physician: George Ruths, MD Location: [facility] Note by: George Daniels, M.D, Date: 03/25/2018; Time: 9:59 AM  Patient Instructions  Stop Xarelto 7 days before procedure.  ____________________________________________________________________________________________  Preparing for your procedure (without sedation)  Instructions: . Oral Intake: Do not eat or drink anything for at least 3 hours prior to your procedure. . Transportation: Unless otherwise stated by your physician, you may drive yourself after the procedure. . Blood Pressure Medicine: Take your blood pressure medicine with a sip of water the morning of the procedure. . Blood thinners: Notify our staff if you are taking any blood thinners. Depending on which one you take, there will be specific instructions on how and when to stop it. . Diabetics on insulin: Notify the staff so that you can be scheduled 1st case in the morning. If your diabetes requires high dose insulin, take only  of your normal insulin dose the morning of the procedure and notify the staff that you have done so. . Preventing infections: Shower with an antibacterial soap  the morning of your procedure.  . Build-up your immune system: Take 1000 mg of Vitamin C with every meal (3 times a day) the day prior to your procedure. Marland Kitchen Antibiotics: Inform the staff if you have a condition or reason that requires you to take antibiotics before dental procedures. . Pregnancy: If you are pregnant, call and cancel the procedure. . Sickness: If you have a cold, fever, or any active infections, call and cancel the procedure. . Arrival: You must be in the facility at least 30 minutes prior to your scheduled procedure. . Children: Do not bring any children with you. . Dress appropriately: Bring dark clothing that you would not mind if they get stained. . Valuables: Do not bring any jewelry or valuables.  Procedure appointments are reserved for interventional treatments only. Marland Kitchen No Prescription Refills. . No medication changes will be discussed during procedure appointments. . No disability issues will be discussed.  Reasons to call and reschedule or cancel your procedure: (Following these recommendations will minimize the risk of a serious complication.) . Surgeries: Avoid having procedures within 2 weeks of any surgery. (Avoid for 2 weeks before or after any surgery). . Flu Shots: Avoid having procedures within 2 weeks of a flu shots or . (Avoid for 2 weeks before or after immunizations). . Barium: Avoid having a procedure within 7-10 days after having had a radiological study involving the use of radiological contrast. (Myelograms, Barium swallow or enema study). . Heart attacks: Avoid any elective procedures or surgeries for the initial 6 months after a "Myocardial Infarction" (Heart Attack). . Blood thinners: It is imperative that you stop these medications before procedures. Let us know if you if you take any blood thinner.  . Infection: Avoid procedures during or within two weeks of an infection (including chest colds or  gastrointestinal problems). Symptoms associated with  infections include: Localized redness, fever, chills, night sweats or profuse sweating, burning sensation when voiding, cough, congestion, stuffiness, runny nose, sore throat, diarrhea, nausea, vomiting, cold or Flu symptoms, recent or current infections. It is specially important if the infection is over the area that we intend to treat. Marland Kitchen Heart and lung problems: Symptoms that may suggest an active cardiopulmonary problem include: cough, chest pain, breathing difficulties or shortness of breath, dizziness, ankle swelling, uncontrolled high or unusually low blood pressure, and/or palpitations. If you are experiencing any of these symptoms, cancel your procedure and contact your primary care physician for an evaluation.  Remember:  Regular Business hours are:  Monday to Thursday 8:00 AM to 4:00 PM  Provider's Schedule: Milinda Pointer, MD:  Procedure days: Tuesday and Thursday 7:30 AM to 4:00 PM  George Santa, MD:  Procedure days: Monday and Wednesday 7:30 AM to 4:00 PM ____________________________________________________________________________________________

## 2018-03-25 NOTE — Patient Instructions (Signed)
Stop Xarelto 7 days before procedure.  ____________________________________________________________________________________________  Preparing for your procedure (without sedation)  Instructions: . Oral Intake: Do not eat or drink anything for at least 3 hours prior to your procedure. . Transportation: Unless otherwise stated by your physician, you may drive yourself after the procedure. . Blood Pressure Medicine: Take your blood pressure medicine with a sip of water the morning of the procedure. . Blood thinners: Notify our staff if you are taking any blood thinners. Depending on which one you take, there will be specific instructions on how and when to stop it. . Diabetics on insulin: Notify the staff so that you can be scheduled 1st case in the morning. If your diabetes requires high dose insulin, take only  of your normal insulin dose the morning of the procedure and notify the staff that you have done so. . Preventing infections: Shower with an antibacterial soap the morning of your procedure.  . Build-up your immune system: Take 1000 mg of Vitamin C with every meal (3 times a day) the day prior to your procedure. Marland Kitchen Antibiotics: Inform the staff if you have a condition or reason that requires you to take antibiotics before dental procedures. . Pregnancy: If you are pregnant, call and cancel the procedure. . Sickness: If you have a cold, fever, or any active infections, call and cancel the procedure. . Arrival: You must be in the facility at least 30 minutes prior to your scheduled procedure. . Children: Do not bring any children with you. . Dress appropriately: Bring dark clothing that you would not mind if they get stained. . Valuables: Do not bring any jewelry or valuables.  Procedure appointments are reserved for interventional treatments only. Marland Kitchen No Prescription Refills. . No medication changes will be discussed during procedure appointments. . No disability issues will be  discussed.  Reasons to call and reschedule or cancel your procedure: (Following these recommendations will minimize the risk of a serious complication.) . Surgeries: Avoid having procedures within 2 weeks of any surgery. (Avoid for 2 weeks before or after any surgery). . Flu Shots: Avoid having procedures within 2 weeks of a flu shots or . (Avoid for 2 weeks before or after immunizations). . Barium: Avoid having a procedure within 7-10 days after having had a radiological study involving the use of radiological contrast. (Myelograms, Barium swallow or enema study). . Heart attacks: Avoid any elective procedures or surgeries for the initial 6 months after a "Myocardial Infarction" (Heart Attack). . Blood thinners: It is imperative that you stop these medications before procedures. Let us know if you if you take any blood thinner.  . Infection: Avoid procedures during or within two weeks of an infection (including chest colds or gastrointestinal problems). Symptoms associated with infections include: Localized redness, fever, chills, night sweats or profuse sweating, burning sensation when voiding, cough, congestion, stuffiness, runny nose, sore throat, diarrhea, nausea, vomiting, cold or Flu symptoms, recent or current infections. It is specially important if the infection is over the area that we intend to treat. Marland Kitchen Heart and lung problems: Symptoms that may suggest an active cardiopulmonary problem include: cough, chest pain, breathing difficulties or shortness of breath, dizziness, ankle swelling, uncontrolled high or unusually low blood pressure, and/or palpitations. If you are experiencing any of these symptoms, cancel your procedure and contact your primary care physician for an evaluation.  Remember:  Regular Business hours are:  Monday to Thursday 8:00 AM to 4:00 PM  Provider's Schedule: Milinda Pointer, MD:  Procedure  days: Tuesday and Thursday 7:30 AM to 4:00 PM  Gillis Santa, MD:   Procedure days: Monday and Wednesday 7:30 AM to 4:00 PM ____________________________________________________________________________________________

## 2018-03-26 ENCOUNTER — Other Ambulatory Visit: Payer: Self-pay

## 2018-03-26 ENCOUNTER — Encounter
Admission: RE | Admit: 2018-03-26 | Discharge: 2018-03-26 | Disposition: A | Discharge status: Home / Self Care | Payer: Medicare HMO | Source: Ambulatory Visit | Attending: Otolaryngology | Admitting: Otolaryngology

## 2018-03-26 DIAGNOSIS — I4510 Unspecified right bundle-branch block: Secondary | ICD-10-CM | POA: Diagnosis not present

## 2018-03-26 DIAGNOSIS — Z01818 Encounter for other preprocedural examination: Secondary | ICD-10-CM | POA: Diagnosis not present

## 2018-03-26 DIAGNOSIS — E119 Type 2 diabetes mellitus without complications: Secondary | ICD-10-CM | POA: Diagnosis not present

## 2018-03-26 DIAGNOSIS — I10 Essential (primary) hypertension: Secondary | ICD-10-CM | POA: Diagnosis not present

## 2018-03-26 HISTORY — DX: Sarcoidosis, unspecified: D86.9

## 2018-03-26 LAB — CBC
HCT: 43.7 % (ref 39.0–52.0)
Hemoglobin: 14.7 g/dL (ref 13.0–17.0)
MCH: 31.1 pg (ref 26.0–34.0)
MCHC: 33.6 g/dL (ref 30.0–36.0)
MCV: 92.4 fL (ref 80.0–100.0)
PLATELETS: 256 10*3/uL (ref 150–400)
RBC: 4.73 MIL/uL (ref 4.22–5.81)
RDW: 13.1 % (ref 11.5–15.5)
WBC: 8.1 10*3/uL (ref 4.0–10.5)
nRBC: 0 % (ref 0.0–0.2)

## 2018-03-26 NOTE — Patient Instructions (Signed)
Your procedure is scheduled on: Wednesday 03/31/18 Report to [facility]. To find out your arrival time please call [phone] between 1PM - 3PM on Tuesday 03/30/18.  Remember: Instructions that are not followed completely may result in serious medical risk, up to and including death, or upon the discretion of your surgeon and anesthesiologist your surgery may need to be rescheduled.     _X__ 1. Do not eat food after midnight the night before your procedure.                 No gum chewing or hard candies. You may drink clear liquids up to 2 hours                 before you are scheduled to arrive for your surgery- DO not drink clear                 liquids within 2 hours of the start of your surgery.                 Clear Liquids include:  water, apple juice without pulp, clear carbohydrate                 drink such as Clearfast or Gatorade, Black Coffee or Tea (Do not add                 anything to coffee or tea).  __X__2.  On the morning of surgery brush your teeth with toothpaste and water, you                 may rinse your mouth with mouthwash if you wish.  Do not swallow any              toothpaste of mouthwash.     _X__ 3.  No Alcohol for 24 hours before or after surgery.   _X__ 4.  Do Not Smoke or use e-cigarettes For 24 Hours Prior to Your Surgery.                 Do not use any chewable tobacco products for at least 6 hours prior to                 surgery.  ____  5.  Bring all medications with you on the day of surgery if instructed.   __X__  6.  Notify your doctor if there is any change in your medical condition      (cold, fever, infections).     Do not wear jewelry, make-up, hairpins, clips or nail polish. Do not wear lotions, powders, or perfumes.  Do not shave 48 hours prior to surgery. Men may shave face and neck. Do not bring valuables to the hospital.    [facility] is not responsible for any belongings or  valuables.  Contacts, dentures/partials or body piercings may not be worn into surgery. Bring a case for your contacts, glasses or hearing aids, a denture cup will be supplied. Leave your suitcase in the car. After surgery it may be brought to your room. For patients admitted to the hospital, discharge time is determined by your treatment team.   Patients discharged the day of surgery will not be allowed to drive home.   Please read over the following fact sheets that you were given:   MRSA Information  __X__ Take these medicines the morning of surgery with A SIP OF WATER:  1. amLODipine (NORVASC)  2. atorvastatin (LIPITOR)  3. citalopram (CELEXA)  4. gabapentin (NEURONTIN)  5. omeprazole (PRILOSEC)  6.  ____ Fleet Enema (as directed)   __X__ Use CHG Soap/SAGE wipes as directed  ____ Use inhalers on the day of surgery  __X__ Stop metformin/Janumet/Farxiga 2 days prior to surgery    ____ Take 1/2 of usual insulin dose the night before surgery. No insulin the morning          of surgery.   ____ Stop Blood Thinners Coumadin/Plavix/Xarelto/Pleta/Pradaxa/Eliquis/Effient/Aspirin  on   Or contact your Surgeon, Cardiologist or Medical Doctor regarding  ability to stop your blood thinners  __X__ Stop Anti-inflammatories 7 days before surgery such as Advil, Ibuprofen, Motrin, BC or Goodies Powder, Naprosyn, Naproxen, Aleve, Aspirin    __X__ Stop all herbal supplements, fish oil or vitamin E until after surgery.    ____ Bring C-Pap to the hospital.

## 2018-03-29 DIAGNOSIS — G8929 Other chronic pain: Secondary | ICD-10-CM | POA: Diagnosis not present

## 2018-03-29 DIAGNOSIS — M545 Low back pain: Secondary | ICD-10-CM | POA: Diagnosis not present

## 2018-03-31 ENCOUNTER — Encounter: Payer: Self-pay | Admitting: *Deleted

## 2018-03-31 ENCOUNTER — Ambulatory Visit: Payer: Medicare HMO | Admitting: Certified Registered"

## 2018-03-31 ENCOUNTER — Ambulatory Visit
Admission: RE | Admit: 2018-03-31 | Discharge: 2018-03-31 | Disposition: A | Discharge status: Home / Self Care | Payer: Medicare HMO | Attending: Otolaryngology | Admitting: Otolaryngology

## 2018-03-31 ENCOUNTER — Encounter
Admission: RE | Disposition: A | Discharge status: Home / Self Care | Payer: Self-pay | Source: Home / Self Care | Attending: Otolaryngology

## 2018-03-31 ENCOUNTER — Other Ambulatory Visit: Payer: Self-pay

## 2018-03-31 DIAGNOSIS — K219 Gastro-esophageal reflux disease without esophagitis: Secondary | ICD-10-CM | POA: Insufficient documentation

## 2018-03-31 DIAGNOSIS — Z7952 Long term (current) use of systemic steroids: Secondary | ICD-10-CM | POA: Insufficient documentation

## 2018-03-31 DIAGNOSIS — G4730 Sleep apnea, unspecified: Secondary | ICD-10-CM | POA: Diagnosis not present

## 2018-03-31 DIAGNOSIS — E785 Hyperlipidemia, unspecified: Secondary | ICD-10-CM | POA: Insufficient documentation

## 2018-03-31 DIAGNOSIS — E119 Type 2 diabetes mellitus without complications: Secondary | ICD-10-CM | POA: Insufficient documentation

## 2018-03-31 DIAGNOSIS — F329 Major depressive disorder, single episode, unspecified: Secondary | ICD-10-CM | POA: Diagnosis not present

## 2018-03-31 DIAGNOSIS — Z7901 Long term (current) use of anticoagulants: Secondary | ICD-10-CM | POA: Insufficient documentation

## 2018-03-31 DIAGNOSIS — Z7982 Long term (current) use of aspirin: Secondary | ICD-10-CM | POA: Diagnosis not present

## 2018-03-31 DIAGNOSIS — Z79899 Other long term (current) drug therapy: Secondary | ICD-10-CM | POA: Diagnosis not present

## 2018-03-31 DIAGNOSIS — D170 Benign lipomatous neoplasm of skin and subcutaneous tissue of head, face and neck: Secondary | ICD-10-CM | POA: Insufficient documentation

## 2018-03-31 DIAGNOSIS — I10 Essential (primary) hypertension: Secondary | ICD-10-CM | POA: Insufficient documentation

## 2018-03-31 DIAGNOSIS — E7800 Pure hypercholesterolemia, unspecified: Secondary | ICD-10-CM | POA: Diagnosis not present

## 2018-03-31 DIAGNOSIS — R221 Localized swelling, mass and lump, neck: Secondary | ICD-10-CM | POA: Diagnosis not present

## 2018-03-31 DIAGNOSIS — Z7984 Long term (current) use of oral hypoglycemic drugs: Secondary | ICD-10-CM | POA: Diagnosis not present

## 2018-03-31 HISTORY — PX: EXCISION MASS NECK: SHX6703

## 2018-03-31 LAB — GLUCOSE, CAPILLARY
Glucose-Capillary: 117 mg/dL — ABNORMAL HIGH (ref 70–99)
Glucose-Capillary: 123 mg/dL — ABNORMAL HIGH (ref 70–99)

## 2018-03-31 SURGERY — EXCISION, MASS, NECK
Anesthesia: General | Laterality: Right

## 2018-03-31 MED ORDER — ONDANSETRON HCL 4 MG/2ML IJ SOLN: 4.0000 mg | Freq: Once | INTRAMUSCULAR | Status: DC | PRN

## 2018-03-31 MED ORDER — EPHEDRINE SULFATE 50 MG/ML IJ SOLN
INTRAMUSCULAR | Status: AC
  Filled 2018-03-31: qty 1

## 2018-03-31 MED ORDER — GLYCOPYRROLATE 0.2 MG/ML IJ SOLN
INTRAMUSCULAR | Status: AC
  Filled 2018-03-31: qty 1

## 2018-03-31 MED ORDER — SEVOFLURANE IN SOLN
RESPIRATORY_TRACT | Status: AC
  Filled 2018-03-31: qty 250

## 2018-03-31 MED ORDER — PHENYLEPHRINE HCL 10 MG/ML IJ SOLN
INTRAMUSCULAR | Status: DC | PRN
  Administered 2018-03-31 (×2): 100 ug via INTRAVENOUS
  Administered 2018-03-31 (×2): 150 ug via INTRAVENOUS

## 2018-03-31 MED ORDER — LIDOCAINE HCL (PF) 2 % IJ SOLN
INTRAMUSCULAR | Status: AC
  Filled 2018-03-31: qty 10

## 2018-03-31 MED ORDER — SODIUM CHLORIDE 0.9 % IV SOLN
INTRAVENOUS | Status: DC
  Administered 2018-03-31: 07:00:00 via INTRAVENOUS

## 2018-03-31 MED ORDER — DEXMEDETOMIDINE HCL 200 MCG/2ML IV SOLN
INTRAVENOUS | Status: DC | PRN
  Administered 2018-03-31: 4 ug via INTRAVENOUS

## 2018-03-31 MED ORDER — SUCCINYLCHOLINE CHLORIDE 20 MG/ML IJ SOLN
INTRAMUSCULAR | Status: AC
  Filled 2018-03-31: qty 1

## 2018-03-31 MED ORDER — PROPOFOL 10 MG/ML IV BOLUS
INTRAVENOUS | Status: DC | PRN
  Administered 2018-03-31: 50 mg via INTRAVENOUS
  Administered 2018-03-31: 200 mg via INTRAVENOUS

## 2018-03-31 MED ORDER — FENTANYL CITRATE (PF) 100 MCG/2ML IJ SOLN
INTRAMUSCULAR | Status: DC | PRN
  Administered 2018-03-31 (×2): 50 ug via INTRAVENOUS

## 2018-03-31 MED ORDER — MIDAZOLAM HCL 2 MG/2ML IJ SOLN
INTRAMUSCULAR | Status: AC
  Filled 2018-03-31: qty 2

## 2018-03-31 MED ORDER — LIDOCAINE HCL (CARDIAC) PF 100 MG/5ML IV SOSY
PREFILLED_SYRINGE | INTRAVENOUS | Status: DC | PRN
  Administered 2018-03-31: 100 mg via INTRAVENOUS

## 2018-03-31 MED ORDER — ONDANSETRON HCL 4 MG/2ML IJ SOLN
INTRAMUSCULAR | Status: AC
  Filled 2018-03-31: qty 2

## 2018-03-31 MED ORDER — BACITRACIN ZINC 500 UNIT/GM EX OINT
TOPICAL_OINTMENT | CUTANEOUS | Status: AC
  Filled 2018-03-31: qty 28.35

## 2018-03-31 MED ORDER — PHENYLEPHRINE HCL 10 MG/ML IJ SOLN
INTRAMUSCULAR | Status: AC
  Filled 2018-03-31: qty 1

## 2018-03-31 MED ORDER — DEXMEDETOMIDINE HCL IN NACL 200 MCG/50ML IV SOLN
INTRAVENOUS | Status: AC
  Filled 2018-03-31: qty 50

## 2018-03-31 MED ORDER — FENTANYL CITRATE (PF) 250 MCG/5ML IJ SOLN
INTRAMUSCULAR | Status: AC
  Filled 2018-03-31: qty 5

## 2018-03-31 MED ORDER — SUCCINYLCHOLINE CHLORIDE 20 MG/ML IJ SOLN
INTRAMUSCULAR | Status: DC | PRN
  Administered 2018-03-31: 120 mg via INTRAVENOUS

## 2018-03-31 MED ORDER — FENTANYL CITRATE (PF) 100 MCG/2ML IJ SOLN: 25.0000 ug | INTRAMUSCULAR | Status: DC | PRN

## 2018-03-31 MED ORDER — DEXAMETHASONE SODIUM PHOSPHATE 10 MG/ML IJ SOLN
INTRAMUSCULAR | Status: DC | PRN
  Administered 2018-03-31: 10 mg via INTRAVENOUS

## 2018-03-31 MED ORDER — EPHEDRINE SULFATE 50 MG/ML IJ SOLN
INTRAMUSCULAR | Status: DC | PRN
  Administered 2018-03-31 (×4): 10 mg via INTRAVENOUS
  Administered 2018-03-31: 5 mg via INTRAVENOUS

## 2018-03-31 MED ORDER — ONDANSETRON HCL 4 MG/2ML IJ SOLN
INTRAMUSCULAR | Status: DC | PRN
  Administered 2018-03-31: 4 mg via INTRAVENOUS

## 2018-03-31 MED ORDER — SODIUM CHLORIDE 0.9 % IV SOLN
INTRAVENOUS | Status: DC | PRN
  Administered 2018-03-31: 30 ug/min via INTRAVENOUS

## 2018-03-31 MED ORDER — GLYCOPYRROLATE 0.2 MG/ML IJ SOLN
INTRAMUSCULAR | Status: DC | PRN
  Administered 2018-03-31: 0.2 mg via INTRAVENOUS

## 2018-03-31 MED ORDER — PROPOFOL 10 MG/ML IV BOLUS
INTRAVENOUS | Status: AC
  Filled 2018-03-31: qty 40

## 2018-03-31 MED ORDER — DEXAMETHASONE SODIUM PHOSPHATE 10 MG/ML IJ SOLN
INTRAMUSCULAR | Status: AC
  Filled 2018-03-31: qty 1

## 2018-03-31 MED ORDER — LIDOCAINE-EPINEPHRINE (PF) 1 %-1:200000 IJ SOLN
INTRAMUSCULAR | Status: AC
  Filled 2018-03-31: qty 20

## 2018-03-31 MED ORDER — MIDAZOLAM HCL 2 MG/2ML IJ SOLN
INTRAMUSCULAR | Status: DC | PRN
  Administered 2018-03-31: 2 mg via INTRAVENOUS

## 2018-03-31 MED ORDER — LIDOCAINE-EPINEPHRINE (PF) 1 %-1:200000 IJ SOLN
INTRAMUSCULAR | Status: DC | PRN
  Administered 2018-03-31: 4 mL

## 2018-03-31 SURGICAL SUPPLY — 30 items
BLADE SURG 15 STRL LF DISP TIS (BLADE) ×1 IMPLANT
BLADE SURG 15 STRL SS (BLADE) ×2
CANISTER SUCT 1200ML W/VALVE (MISCELLANEOUS) ×3 IMPLANT
CORD BIP STRL DISP 12FT (MISCELLANEOUS) ×3 IMPLANT
COVER WAND RF STERILE (DRAPES) IMPLANT
DRAPE MAG INST 16X20 L/F (DRAPES) ×3 IMPLANT
DRSG TEGADERM 2-3/8X2-3/4 SM (GAUZE/BANDAGES/DRESSINGS) IMPLANT
DRSG TELFA 4X3 1S NADH ST (GAUZE/BANDAGES/DRESSINGS) IMPLANT
ELECT REM PT RETURN 9FT ADLT (ELECTROSURGICAL) ×3
ELECTRODE REM PT RTRN 9FT ADLT (ELECTROSURGICAL) ×1 IMPLANT
FORCEPS JEWEL BIP 4-3/4 STR (INSTRUMENTS) ×3 IMPLANT
GLOVE BIO SURGEON STRL SZ7.5 (GLOVE) ×6 IMPLANT
GOWN STRL REUS W/ TWL LRG LVL3 (GOWN DISPOSABLE) ×2 IMPLANT
GOWN STRL REUS W/TWL LRG LVL3 (GOWN DISPOSABLE) ×4
HOOK STAY BLUNT/RETRACTOR 5M (MISCELLANEOUS) IMPLANT
JACKSON PRATT 7MM (INSTRUMENTS) IMPLANT
KIT TURNOVER KIT A (KITS) ×3 IMPLANT
LABEL OR SOLS (LABEL) IMPLANT
NS IRRIG 500ML POUR BTL (IV SOLUTION) ×3 IMPLANT
PACK HEAD/NECK (MISCELLANEOUS) ×3 IMPLANT
SHEARS HARMONIC 9CM CVD (BLADE) ×3 IMPLANT
SPONGE KITTNER 5P (MISCELLANEOUS) ×3 IMPLANT
SUCTION FRAZIER HANDLE 10FR (MISCELLANEOUS) ×2
SUCTION TUBE FRAZIER 10FR DISP (MISCELLANEOUS) ×1 IMPLANT
SUT PLAIN GUT FAST 5-0 (SUTURE) ×3 IMPLANT
SUT PROLENE 3 0 FS 2 (SUTURE) ×3 IMPLANT
SUT SILK 3 0 (SUTURE) ×2
SUT SILK 3-0 18XBRD TIE 12 (SUTURE) ×1 IMPLANT
SUT VIC AB 4-0 FS2 27 (SUTURE) IMPLANT
SUT VIC AB 4-0 RB1 18 (SUTURE) ×3 IMPLANT

## 2018-03-31 NOTE — Transfer of Care (Signed)
Immediate Anesthesia Transfer of Care Note  Patient: George Daniels  Procedure(s) Performed: EXCISION MASS NECK (Right )  Patient Location: PACU  Anesthesia Type:General  Level of Consciousness: drowsy and patient cooperative  Airway & Oxygen Therapy: Patient Spontanous Breathing  Post-op Assessment: Report given to RN and Post -op Vital signs reviewed and stable  Post vital signs: Reviewed and stable  Last Vitals:  Vitals Value Taken Time  BP 134/58 03/31/2018  9:13 AM  Temp 36.2 C 03/31/2018  9:13 AM  Pulse 84 03/31/2018  9:16 AM  Resp 23 03/31/2018  9:16 AM  SpO2 100 % 03/31/2018  9:16 AM  Vitals shown include unvalidated device data.  Last Pain:  Vitals:   03/31/18 0625  TempSrc: Oral  PainSc: 0-No pain         Complications: No apparent anesthesia complications

## 2018-03-31 NOTE — H&P (Signed)
History and physical reviewed and will be scanned in later. No change in medical status reported by the patient or family, appears stable for surgery. All questions regarding the procedure answered, and patient (or family if a child) expressed understanding of the procedure. ? ?George Daniels S Coleta Grosshans ?@[email or web address]@ ?

## 2018-03-31 NOTE — Anesthesia Procedure Notes (Signed)
Procedure Name: Intubation Date/Time: 03/31/2018 7:49 AM Performed by: Lavone Orn, CRNA Pre-anesthesia Checklist: Patient identified, Emergency Drugs available, Suction available, Patient being monitored and Timeout performed Patient Re-evaluated:Patient Re-evaluated prior to induction Oxygen Delivery Method: Circle system utilized Preoxygenation: Pre-oxygenation with 100% oxygen Induction Type: IV induction Ventilation: Mask ventilation without difficulty Laryngoscope Size: Mac and 4 Grade View: Grade II Tube type: Oral Number of attempts: 1 Airway Equipment and Method: Stylet Placement Confirmation: ETT inserted through vocal cords under direct vision,  positive ETCO2 and breath sounds checked- equal and bilateral Secured at: 23 cm Tube secured with: Tape Dental Injury: Teeth and Oropharynx as per pre-operative assessment

## 2018-03-31 NOTE — Anesthesia Preprocedure Evaluation (Signed)
Anesthesia Evaluation  Patient identified by MRN, date of birth, ID band Patient awake    Reviewed: Allergy & Precautions, H&P , NPO status , Patient's Chart, lab work & pertinent test results  History of Anesthesia Complications Negative for: history of anesthetic complications  Airway Mallampati: III  TM Distance: >3 FB Neck ROM: full    Dental  (+) Poor Dentition   Pulmonary neg shortness of breath, sleep apnea and Continuous Positive Airway Pressure Ventilation , neg recent URI,    Pulmonary exam normal breath sounds clear to auscultation       Cardiovascular Exercise Tolerance: Good hypertension, (-) angina(-) Past MI and (-) DOE negative cardio ROS Normal cardiovascular exam Rhythm:regular Rate:Normal     Neuro/Psych PSYCHIATRIC DISORDERS negative neurological ROS     GI/Hepatic Neg liver ROS, GERD  Controlled,  Endo/Other  negative endocrine ROSdiabetes  Renal/GU negative Renal ROS  negative genitourinary   Musculoskeletal  (+) Arthritis ,   Abdominal   Peds  Hematology negative hematology ROS (+)   Anesthesia Other Findings Past Medical History:   Family history of adverse reaction to anesthes*                Comment:daughter nausea   Hypertension                                                 Depression                                                   GERD (gastroesophageal reflux disease)                       Arthritis                                                      Comment:back   Sleep apnea                                                 Past Surgical History:   TONSILLECTOMY                                                 ACHILLES TENDON REPAIR                          Right 2014           Reproductive/Obstetrics negative OB ROS                             Anesthesia Physical  Anesthesia Plan  ASA: III  Anesthesia Plan: General   Post-op Pain  Management: GA combined w/ [facility] for post-op pain   Induction: Intravenous  PONV Risk Score and Plan: 2 and Ondansetron and Dexamethasone  Airway Management Planned: Oral ETT  Additional Equipment:   Intra-op Plan:   Post-operative Plan: Extubation in OR  Informed Consent: I have reviewed the patients History and Physical, chart, labs and discussed the procedure including the risks, benefits and alternatives for the proposed anesthesia with the patient or authorized representative who has indicated his/her understanding and acceptance.   Dental Advisory Given  Plan Discussed with: Anesthesiologist, CRNA and Surgeon  Anesthesia Plan Comments:         Anesthesia Quick Evaluation

## 2018-03-31 NOTE — Op Note (Signed)
03/31/2018  9:01 AM    George Daniels  [id number]   Pre-Op Diagnosis:  RIGHT POSTERIOR NECK MASS  Post-op Diagnosis: SAME  Procedure:   Excision of right posterior neck mass with intermediate closure, 2.5 cm  Surgeon:  Riley Nearing  Anesthesia:  General endotracheal  EBL: Less than 10 cc  Complications:  None  Findings: 2.5 cm subcutaneous lipoma  Procedure: After the patient was identified in holding and the procedure was reviewed.  The patient was taken to the operating room and with the patient in a comfortable supine position,  general endotracheal anesthesia was induced without difficulty.  A proper time-out was performed, confirming the operative site and procedure.  The lipoma was palpated and 1% lidocaine with epinephrine 1-200,000 injected into the skin overlying the mass.  The area was then prepped and draped in usual sterile fashion.  A 15 blade was used to incise the skin carrying the incision down to the subcutaneous fatty tissues.  The mass was carefully dissected out, watching carefully for the spinal accessory nerve.  A small nerve was encountered and stimulated but appeared to be more of a sensory nerve.  This nerve was still preserved during dissection of the lipoma.  Dissection was performed with both sharp and bipolar cautery dissection, using the bipolar cautery to cauterize small vessels in the region.  The mass was completely excised and sent for pathology.  Deep to the mass, the spinal accessory nerve was stimulated and identified, still under the deep fascia and undisturbed by the dissection.  Wound was irrigated with sterile saline and closed in a layered fashion with 4-0 Vicryl suture for the deep closure and 5-0 fast-absorbing gut suture for the skin.  The patient was then returned to the anesthesiologist in good condition for awakening. The patient was awakened and taken to the recovery room in good condition.   Disposition:   PACU discharge  home  Plan: Polysporin to wound twice daily.  Clean with peroxide as needed.  Ice may be applied to area as needed for comfort.  Tylenol as needed for pain.  Riley Nearing 03/31/2018 9:01 AM

## 2018-03-31 NOTE — OR Nursing (Signed)
Discussed discharge instructions with patient and wife. Both voice understanding. 

## 2018-03-31 NOTE — Discharge Instructions (Signed)
AMBULATORY SURGERY  °DISCHARGE INSTRUCTIONS ° ° °1) The drugs that you were given will stay in your system until tomorrow so for the next 24 hours you should not: ° °A) Drive an automobile °B) Make any legal decisions °C) Drink any alcoholic beverage ° ° °2) You may resume regular meals tomorrow.  Today it is better to start with liquids and gradually work up to solid foods. ° °You may eat anything you prefer, but it is better to start with liquids, then soup and crackers, and gradually work up to solid foods. ° ° °3) Please notify your doctor immediately if you have any unusual bleeding, trouble breathing, redness and pain at the surgery site, drainage, fever, or pain not relieved by medication. ° ° ° °4) Additional Instructions: ° ° ° ° ° ° ° °Please contact your physician with any problems or Same Day Surgery at [phone], Monday through Friday 6 am to 4 pm, or [place] [facility] [place] Main number at [phone]. °

## 2018-03-31 NOTE — Anesthesia Post-op Follow-up Note (Signed)
Anesthesia QCDR form completed.        

## 2018-04-01 ENCOUNTER — Encounter: Payer: Self-pay | Admitting: Otolaryngology

## 2018-04-01 LAB — SURGICAL PATHOLOGY

## 2018-04-01 NOTE — Anesthesia Postprocedure Evaluation (Signed)
Anesthesia Post Note  Patient: George Daniels  Procedure(s) Performed: EXCISION MASS NECK (Right )  Patient location during evaluation: PACU Anesthesia Type: General Level of consciousness: awake and alert Pain management: pain level controlled Vital Signs Assessment: post-procedure vital signs reviewed and stable Respiratory status: spontaneous breathing, nonlabored ventilation, respiratory function stable and patient connected to nasal cannula oxygen Cardiovascular status: blood pressure returned to baseline and stable Postop Assessment: no apparent nausea or vomiting Anesthetic complications: no     Last Vitals:  Vitals:   03/31/18 0945 03/31/18 1009  BP: (!) 123/57 (!) 131/59  Pulse: 75 79  Resp: 18 18  Temp:    SpO2: 100% 100%    Last Pain:  Vitals:   04/01/18 0843  TempSrc:   PainSc: 0-No pain                 Martha Clan

## 2018-04-08 DIAGNOSIS — M5442 Lumbago with sciatica, left side: Secondary | ICD-10-CM | POA: Diagnosis not present

## 2018-04-08 DIAGNOSIS — E118 Type 2 diabetes mellitus with unspecified complications: Secondary | ICD-10-CM | POA: Diagnosis not present

## 2018-04-08 DIAGNOSIS — G8929 Other chronic pain: Secondary | ICD-10-CM | POA: Diagnosis not present

## 2018-04-09 ENCOUNTER — Ambulatory Visit: Payer: Medicare HMO | Admitting: Internal Medicine

## 2018-04-15 ENCOUNTER — Encounter: Payer: Self-pay | Admitting: Student in an Organized Health Care Education/Training Program

## 2018-04-15 ENCOUNTER — Other Ambulatory Visit
Admission: RE | Admit: 2018-04-15 | Discharge: 2018-04-15 | Disposition: A | Discharge status: Home / Self Care | Payer: Medicare HMO | Source: Ambulatory Visit | Attending: Internal Medicine | Admitting: Internal Medicine

## 2018-04-15 DIAGNOSIS — D869 Sarcoidosis, unspecified: Secondary | ICD-10-CM | POA: Diagnosis not present

## 2018-04-15 DIAGNOSIS — M4807 Spinal stenosis, lumbosacral region: Secondary | ICD-10-CM

## 2018-04-15 LAB — COMPREHENSIVE METABOLIC PANEL
ALK PHOS: 84 U/L (ref 38–126)
ALT: 37 U/L (ref 0–44)
ANION GAP: 10 (ref 5–15)
AST: 22 U/L (ref 15–41)
Albumin: 4.2 g/dL (ref 3.5–5.0)
BUN: 30 mg/dL — ABNORMAL HIGH (ref 8–23)
CO2: 28 mmol/L (ref 22–32)
Calcium: 9.5 mg/dL (ref 8.9–10.3)
Chloride: 98 mmol/L (ref 98–111)
Creatinine, Ser: 1.23 mg/dL (ref 0.61–1.24)
GFR calc Af Amer: >60 mL/min (ref >60)
GFR calc non Af Amer: 59 mL/min — ABNORMAL LOW (ref >60)
GLUCOSE: 140 mg/dL — AB (ref 70–99)
Potassium: 4.4 mmol/L (ref 3.5–5.1)
Sodium: 136 mmol/L (ref 135–145)
Total Bilirubin: 1.3 mg/dL — ABNORMAL HIGH (ref 0.3–1.2)
Total Protein: 6.9 g/dL (ref 6.5–8.1)

## 2018-04-16 LAB — ANGIOTENSIN CONVERTING ENZYME: ANGIOTENSIN-CONVERTING ENZYME: 9 U/L — AB (ref 14–82)

## 2018-04-18 ENCOUNTER — Encounter: Payer: Self-pay | Admitting: Student in an Organized Health Care Education/Training Program

## 2018-04-19 ENCOUNTER — Ambulatory Visit: Payer: Medicare HMO | Admitting: Student in an Organized Health Care Education/Training Program

## 2018-04-20 ENCOUNTER — Ambulatory Visit: Payer: Medicare HMO | Admitting: Internal Medicine

## 2018-04-27 ENCOUNTER — Encounter: Payer: Self-pay | Admitting: Student in an Organized Health Care Education/Training Program

## 2018-04-28 ENCOUNTER — Ambulatory Visit: Payer: Medicare HMO | Admitting: Internal Medicine

## 2018-04-28 ENCOUNTER — Encounter: Payer: Self-pay | Admitting: Internal Medicine

## 2018-04-28 VITALS — BP 108/64 | HR 83 | Resp 16 | Ht 72.0 in | Wt 235.0 lb

## 2018-04-28 DIAGNOSIS — D869 Sarcoidosis, unspecified: Secondary | ICD-10-CM | POA: Diagnosis not present

## 2018-04-28 MED ORDER — PREDNISONE 10 MG PO TABS: 10.0000 mg | ORAL_TABLET | Freq: Every day | ORAL | 3 refills | Status: DC

## 2018-04-28 NOTE — Progress Notes (Signed)
[facility] Consultation      Assessment and Plan:  Sarcoidosis, Lung nodule with paratracheal and hilar lymphadenopathy. -Continues to have scattered calcified lung nodules, mediastinal lymphadenopathy. -TB QuantiFERON negative. -Patient wants to be taken off of methotrexate, asked him to wean by 1 tablet weekly until gone.  I have given him a prescription for prednisone 10 mg daily to be taken if/when his pain symptoms return.  I explained that his pain symptoms are currently better because he received a recent steroid depot shot which is likely treating him.  When this wears off I suspect that his pain will return, and he should start the prednisone again at that time. -We will check a baseline chest x-ray in 3 months.  Acute kidney injury. - Mild increase in creatinine today. - We will wean off the methotrexate, he is asked to drink plenty of water, will recheck in 1 month.  If renal disease is progressing at that time will need referral to nephrologist.  Sarcoidosis of the spine. -Continued back pain, which appears exacerbated after stopping prednisone, improved after receiving Depo steroid injection in his knee. - Patient is followed up with neurosurgery, no intervention was indicated at this time.  Greater than 50% of the 40 minute visit was spent in counseling/coordination of care regarding sarcoidosis.    Meds ordered this encounter  Medications  . predniSONE (DELTASONE) 10 MG tablet    Sig: Take 1 tablet (10 mg total) by mouth daily with breakfast.    Dispense:  90 tablet    Refill:  3     Orders Placed This Encounter  Procedures  . DG Chest 2 View  . Comp Met (CMET)   Return in about 3 months (around 07/28/2018).    Date: 04/28/2018  MRN# [id number] JERRIT HOREN 1945/09/13   ANGUEL DELAPENA is a 73 y.o. old male seen in consultation for chief complaint of:    Chief Complaint  Patient presents with  . Sarcoidosis    patient sent over a  letter c/o whole bodyaches. He states he is at baseline at this time.  . Shortness of Breath    with incline when going up stairs.    HPI:  The patient is a 73 yo male with sarcoidosis. He has been having chronic back pain due to lower back spinal lesions and lung lesions. He had a biopsy of a right hip lesion and underwent a Ct guided biopsy on 04/30/17 which showed granuloma. He has had repeat Ct scan and MRI which have shown no change.  Last visit the patient was on methotrexate 12.5 once weekly and prednisone 5 mg daily.  The prednisone was stopped, since that time he is noticed increased pain in his back.  His calcium is stable, most recent creatinine has risen slightly to 1.23, up from a baseline of about 0.9.  ACE level remains negative.  He has written a 1 page letter detailing events since his last clinic visit which I have reviewed with him. Since that time he also developed a rash on the front of his legs, he went to see his PCP and received an antifungal antibiotic and cream, the rash eventually went away.  Subsequently he had a Depo steroid injection in his right knee, and with a few days his body aches went away. He has read several bad side effects associated with methotrexate and he would like to get himself off of it.  He noticed that he has very stinky gas  and he has to go to the garage to pass gas which he attributes to methotrexate.  Previous workup: Patient was noted to have back pain, an MRI showed progressive bone lesions, which have been present since 2013.  He subsequently had a PET scan which showed increased uptake in bone lesions suggestive of metastatic disease.  He then underwent CT guided biopsy of indeterminate lytic lesion involving the posterior aspect of the right ilium, which showed granulomatous disease suggestive of sarcoidosis.  He was asked to start methotrexate, and decreased prednisone to 10 mg daily. He is taking methotrexate now to 7.5 mg once weekly, started  2 months ago. He notes that is the same as always and has not improved on prednisone.   he had RL calf clot, he is on xarelto.   He continues have chronic back pain of 4-5/10. It has progressed from 10 years ago.  **ACE level and metabolic panel 12/06/2351>> ACE level less than 15, liver functions normal. **Liver enzymes 08/20/17>>normal.  **CT chest 09/28/17>> imaging personally reviewed and comparaed with previous on 06/29/17>>; overall CT image is unchanged, with continued presence of multiple bilateral lung nodules, right basal strandy atelectasis or scarring.  There is also mild internal calcification in the masslike opacity in the right lower lobe and mediastinal lymphadenopathy. **MRI L Spine 12/24/17>> multiple bony changes with bulging disc, with potential for-year-old compression, no significant changes from previous scan on 04/01/17.  PET scan radiologist report iMPRESSION: 1. Multiple hypermetabolic pulmonary nodules bilaterally, including a dominant spiculated lesion in the right lower lobe, likely reflecting primary bronchogenic carcinoma. 2. Associated hypermetabolic mediastinal and bilateral hilar lymph nodes consistent with nodal metastases. 3. Widespread osseous metastatic disease. 4. No definite extra osseous tumor outside of the chest.  Medication:    Current Outpatient Medications:  .  ALPRAZolam (XANAX) 0.5 MG tablet, Take 0.5 mg by mouth daily as needed for anxiety. , Disp: , Rfl:  .  amLODipine (NORVASC) 5 MG tablet, Take 5 mg by mouth daily. , Disp: , Rfl:  .  aspirin 81 MG tablet, Take 81 mg by mouth every morning., Disp: , Rfl:  .  atorvastatin (LIPITOR) 10 MG tablet, Take 10 mg by mouth every morning., Disp: , Rfl:  .  citalopram (CELEXA) 40 MG tablet, Take 40 mg by mouth every morning., Disp: , Rfl:  .  clotrimazole (LOTRIMIN) 1 % cream, Apply 1 application topically every other day., Disp: , Rfl:  .  gabapentin (NEURONTIN) 300 MG capsule, Take 300-600 mg by mouth  See admin instructions. Take 300 mg by mouth in the morning and evening with dinner and take 600 mg at bedtime., Disp: , Rfl:  .  hydrocortisone cream 1 %, Apply 1 application topically every other day., Disp: , Rfl:  .  lisinopril-hydrochlorothiazide (PRINZIDE,ZESTORETIC) 20-25 MG tablet, Take 1 tablet by mouth every morning., Disp: , Rfl:  .  metFORMIN (GLUCOPHAGE-XR) 500 MG 24 hr tablet, Take 500 mg by mouth daily with supper. , Disp: , Rfl:  .  methotrexate (RHEUMATREX) 2.5 MG tablet, Take 5 tablets (12.5 mg total) by mouth once a week. Caution:Chemotherapy. Protect from light., Disp: 75 tablet, Rfl: 3 .  mometasone (ELOCON) 0.1 % lotion, Apply 1 application topically 2 (two) times a week., Disp: , Rfl:  .  omeprazole (PRILOSEC) 40 MG capsule, Take 40 mg by mouth every evening., Disp: , Rfl:  .  predniSONE (DELTASONE) 10 MG tablet, Take 1 tablet (10 mg total) by mouth daily with breakfast. (Patient not taking:  Reported on 03/23/2018), Disp: 180 tablet, Rfl: 1 .  rOPINIRole (REQUIP) 3 MG tablet, Take 3 mg by mouth daily after supper. , Disp: , Rfl:  .  sildenafil (REVATIO) 20 MG tablet, Take 20 mg by mouth daily as needed (ED)., Disp: , Rfl:  .  tiZANidine (ZANAFLEX) 2 MG tablet, Take 2 mg by mouth every 8 (eight) hours as needed for muscle spasms. , Disp: , Rfl:  .  traZODone (DESYREL) 100 MG tablet, Take 100 mg by mouth at bedtime., Disp: , Rfl:    Allergies:  Penicillins  Review of Systems:  Constitutional: Feels well. Cardiovascular: Denies chest pain, exertional chest pain.  Pulmonary: Denies hemoptysis, pleuritic chest pain.   The remainder of systems were reviewed and were found to be negative other than what is documented in the HPI.    Physical Examination:   VS: BP 108/64 (BP Location: Left Arm, Cuff Size: Large)   Pulse 83   Resp 16   Ht 6' (1.829 m)   Wt 235 lb (106.6 kg)   SpO2 94%   BMI 31.87 kg/m   General Appearance: No distress  Neuro:without focal findings,  mental status, speech normal, alert and oriented HEENT: PERRLA, EOM intact Pulmonary: No wheezing, No rales  CardiovascularNormal S1,S2.  No m/r/g.  Abdomen: Benign, Soft, non-tender, No masses Renal:  No costovertebral tenderness  GU:  No performed at this time. Endoc: No evident thyromegaly, no signs of acromegaly or Cushing features Skin:   warm, no rashes, no ecchymosis  Extremities: normal, no cyanosis, clubbing.    LABORATORY PANEL:   CBC No results for input(s): WBC, HGB, HCT, PLT in the last 168 hours. ------------------------------------------------------------------------------------------------------------------  Chemistries  No results for input(s): NA, K, CL, CO2, GLUCOSE, BUN, CREATININE, CALCIUM, MG, AST, ALT, ALKPHOS, BILITOT in the last 168 hours.  Invalid input(s): GFRCGP ------------------------------------------------------------------------------------------------------------------  Cardiac Enzymes No results for input(s): TROPONINI in the last 168 hours. ------------------------------------------------------------  RADIOLOGY:  No results found.     Thank  you for the consultation and for allowing [facility] to assist in the care of your patient. Our recommendations are noted above.  Please contact us if we can be of further service.  Marda Stalker, M.D., F.C.C.P.  Board Certified in Internal Medicine, Pulmonary Medicine, [place], and Sleep Medicine.  [facility] Office Number: [phone]   04/28/2018

## 2018-04-28 NOTE — Patient Instructions (Signed)
Can decrease methotrexate by 1 tablet weekly until gone.  If you start to develop body aches again over the next few weeks/months restart prednisone at 10 mg daily.   Keep [place] in the loop for any changes in your status.

## 2018-05-03 ENCOUNTER — Ambulatory Visit: Payer: Medicare HMO | Admitting: Student in an Organized Health Care Education/Training Program

## 2018-05-04 ENCOUNTER — Encounter: Payer: Self-pay | Admitting: Student in an Organized Health Care Education/Training Program

## 2018-05-10 ENCOUNTER — Ambulatory Visit
Admission: RE | Admit: 2018-05-10 | Discharge: 2018-05-10 | Disposition: A | Discharge status: Home / Self Care | Payer: Medicare HMO | Source: Ambulatory Visit | Attending: Student in an Organized Health Care Education/Training Program | Admitting: Student in an Organized Health Care Education/Training Program

## 2018-05-10 ENCOUNTER — Ambulatory Visit (HOSPITAL_BASED_OUTPATIENT_CLINIC_OR_DEPARTMENT_OTHER): Payer: Medicare HMO | Admitting: Student in an Organized Health Care Education/Training Program

## 2018-05-10 ENCOUNTER — Encounter: Payer: Self-pay | Admitting: Student in an Organized Health Care Education/Training Program

## 2018-05-10 VITALS — BP 146/64 | HR 72 | Temp 97.9°F | Resp 20 | Ht 72.0 in | Wt 235.0 lb

## 2018-05-10 DIAGNOSIS — M5416 Radiculopathy, lumbar region: Secondary | ICD-10-CM | POA: Diagnosis not present

## 2018-05-10 MED ORDER — LIDOCAINE HCL 2 % IJ SOLN
10.0000 mL | Freq: Once | INTRAMUSCULAR | Status: AC
  Administered 2018-05-10: 400 mg

## 2018-05-10 MED ORDER — ROPIVACAINE HCL 2 MG/ML IJ SOLN
2.0000 mL | Freq: Once | INTRAMUSCULAR | Status: AC
  Administered 2018-05-10: 10 mL via EPIDURAL

## 2018-05-10 MED ORDER — IOPAMIDOL (ISOVUE-M 200) INJECTION 41%
INTRAMUSCULAR | Status: AC
  Filled 2018-05-10: qty 10

## 2018-05-10 MED ORDER — SODIUM CHLORIDE (PF) 0.9 % IJ SOLN
INTRAMUSCULAR | Status: AC
  Filled 2018-05-10: qty 10

## 2018-05-10 MED ORDER — SODIUM CHLORIDE 0.9% FLUSH
2.0000 mL | Freq: Once | INTRAVENOUS | Status: AC
  Administered 2018-05-10: 10 mL

## 2018-05-10 MED ORDER — LIDOCAINE HCL 2 % IJ SOLN
INTRAMUSCULAR | Status: AC
  Filled 2018-05-10: qty 20

## 2018-05-10 MED ORDER — IOPAMIDOL (ISOVUE-M 200) INJECTION 41%
10.0000 mL | Freq: Once | INTRAMUSCULAR | Status: AC
  Administered 2018-05-10: 10 mL via EPIDURAL

## 2018-05-10 MED ORDER — DEXAMETHASONE SODIUM PHOSPHATE 10 MG/ML IJ SOLN
10.0000 mg | Freq: Once | INTRAMUSCULAR | Status: AC
  Administered 2018-05-10: 10 mg

## 2018-05-10 MED ORDER — ROPIVACAINE HCL 2 MG/ML IJ SOLN
INTRAMUSCULAR | Status: AC
  Filled 2018-05-10: qty 10

## 2018-05-10 MED ORDER — DEXAMETHASONE SODIUM PHOSPHATE 10 MG/ML IJ SOLN
INTRAMUSCULAR | Status: AC
  Filled 2018-05-10: qty 1

## 2018-05-10 NOTE — Progress Notes (Signed)
Patient's Name: George Daniels  MRN: [id number]  Referring Provider: Kirk Ruths, MD  DOB: Jul 21, 1945  PCP: Kirk Ruths, MD  DOS: 05/10/2018  Note by: Gillis Santa, MD  Service setting: Ambulatory outpatient  Specialty: Interventional Pain Management  Patient type: Established  Location: [facility] (AMB) Pain Management Facility  Visit type: Interventional Procedure   Primary Reason for Visit: Interventional Pain Management Treatment. CC: Back Pain (lumbar)  Procedure:          Anesthesia, Analgesia, Anxiolysis:  Type: Therapeutic Inter-Laminar Epidural Steroid Injection  #1  Region: Lumbar Level: L2-3 Level. Laterality: Left-Sided         Type: Local Anesthesia Indication(s): Analgesia         Route: Infiltration ([place]/IM) IV Access: Declined Sedation: Declined  Local Anesthetic: Lidocaine 1-2%  Position: Prone with head of the table was raised to facilitate breathing.   Indications: 1. Lumbar radiculopathy    Pain Score: Pre-procedure: 4 /10 Post-procedure: 1 /10  Pre-op Assessment:  [name] is a 73 y.o. (year old), male patient, seen today for interventional treatment. He  has a past surgical history that includes Tonsillectomy; Achilles tendon repair (Right, 2014); Shoulder arthroscopy with open rotator cuff repair (Right, 08/02/2015); Tennis elbow release/nirschel procedure (Right, 10/02/2016); Cataract extraction, bilateral; and Excision mass neck (Right, 03/31/2018). Mr. Delay has a current medication list which includes the following prescription(s): alprazolam, amlodipine, aspirin, atorvastatin, citalopram, clotrimazole, gabapentin, hydrocortisone cream, lisinopril-hydrochlorothiazide, methotrexate, omeprazole, prednisone, ropinirole, sildenafil, tizanidine, trazodone, and metformin. His primarily concern today is the Back Pain (lumbar)  Initial Vital Signs:  Pulse/HCG Rate: 72ECG Heart Rate: 68 Temp: 97.9 F (36.6 C) Resp: 16 BP: (!) 156/81 SpO2: 100  %  BMI: Estimated body mass index is 31.87 kg/m as calculated from the following:   Height as of this encounter: 6' (1.829 m).   Weight as of this encounter: 235 lb (106.6 kg).  Risk Assessment: Allergies: Reviewed. He is allergic to penicillins.  Allergy Precautions: None required Coagulopathies: Reviewed. None identified.  Blood-thinner therapy: None at this time Active Infection(s): Reviewed. None identified. Mr. Debski is afebrile  Site Confirmation: [name] was asked to confirm the procedure and laterality before marking the site Procedure checklist: Completed Consent: Before the procedure and under the influence of no sedative(s), amnesic(s), or anxiolytics, the patient was informed of the treatment options, risks and possible complications. To fulfill our ethical and legal obligations, as recommended by the American Medical Association's Code of Ethics, I have informed the patient of my clinical impression; the nature and purpose of the treatment or procedure; the risks, benefits, and possible complications of the intervention; the alternatives, including doing nothing; the risk(s) and benefit(s) of the alternative treatment(s) or procedure(s); and the risk(s) and benefit(s) of doing nothing. The patient was provided information about the general risks and possible complications associated with the procedure. These may include, but are not limited to: failure to achieve desired goals, infection, bleeding, organ or nerve damage, allergic reactions, paralysis, and death. In addition, the patient was informed of those risks and complications associated to Spine-related procedures, such as failure to decrease pain; infection (i.e.: Meningitis, epidural or intraspinal abscess); bleeding (i.e.: epidural hematoma, subarachnoid hemorrhage, or any other type of intraspinal or peri-dural bleeding); organ or nerve damage (i.e.: Any type of peripheral nerve, nerve root, or spinal cord injury) with  subsequent damage to sensory, motor, and/or autonomic systems, resulting in permanent pain, numbness, and/or weakness of one or several areas of the body; allergic reactions; (  i.e.: anaphylactic reaction); and/or death. Furthermore, the patient was informed of those risks and complications associated with the medications. These include, but are not limited to: allergic reactions (i.e.: anaphylactic or anaphylactoid reaction(s)); adrenal axis suppression; blood sugar elevation that in diabetics may result in ketoacidosis or comma; water retention that in patients with history of congestive heart failure may result in shortness of breath, pulmonary edema, and decompensation with resultant heart failure; weight gain; swelling or edema; medication-induced neural toxicity; particulate matter embolism and blood vessel occlusion with resultant organ, and/or nervous system infarction; and/or aseptic necrosis of one or more joints. Finally, the patient was informed that Medicine is not an exact science; therefore, there is also the possibility of unforeseen or unpredictable risks and/or possible complications that may result in a catastrophic outcome. The patient indicated having understood very clearly. We have given the patient no guarantees and we have made no promises. Enough time was given to the patient to ask questions, all of which were answered to the patient's satisfaction. [name] has indicated that he wanted to continue with the procedure. Attestation: I, the ordering provider, attest that I have discussed with the patient the benefits, risks, side-effects, alternatives, likelihood of achieving goals, and potential problems during recovery for the procedure that I have provided informed consent. Date  Time: 05/10/2018  1:02 PM  Pre-Procedure Preparation:  Monitoring: As per clinic protocol. Respiration, ETCO2, SpO2, BP, heart rate and rhythm monitor placed and checked for adequate function Safety  Precautions: Patient was assessed for positional comfort and pressure points before starting the procedure. Time-out: I initiated and conducted the "Time-out" before starting the procedure, as per protocol. The patient was asked to participate by confirming the accuracy of the "Time Out" information. Verification of the correct person, site, and procedure were performed and confirmed by me, the nursing staff, and the patient. "Time-out" conducted as per Joint Commission's Universal Protocol (UP.01.01.01). Time: 1339  Description of Procedure:          Target Area: The interlaminar space, initially targeting the lower laminar border of the superior vertebral body. Approach: Paramedial approach. Area Prepped: Entire Posterior Lumbar Region Prepping solution: ChloraPrep (2% chlorhexidine gluconate and 70% isopropyl alcohol) Safety Precautions: Aspiration looking for blood return was conducted prior to all injections. At no point did we inject any substances, as a needle was being advanced. No attempts were made at seeking any paresthesias. Safe injection practices and needle disposal techniques used. Medications properly checked for expiration dates. SDV (single dose vial) medications used. Description of the Procedure: Protocol guidelines were followed. The procedure needle was introduced through the skin, ipsilateral to the reported pain, and advanced to the target area. Bone was contacted and the needle walked caudad, until the lamina was cleared. The epidural space was identified using "loss-of-resistance technique" with 2-3 ml of PF-NaCl (0.9% NSS), in a 5cc LOR glass syringe.  Vitals:   05/10/18 1308 05/10/18 1340 05/10/18 1344 05/10/18 1353  BP: (!) 156/81 135/73 134/71 (!) 146/64  Pulse: 72     Resp: 16 20 20 20   Temp: 97.9 F (36.6 C)     TempSrc: Oral     SpO2: 100% 94% 94% 97%  Weight: 235 lb (106.6 kg)     Height: 6' (1.829 m)       Start Time: 1339 hrs. End Time: 1346  hrs.  Materials:  Needle(s) Type: Epidural needle Gauge: 17G Length: 3.5-in Medication(s): Please see orders for medications and dosing details. 8 cc solution made  of 5 cc of preservative-free saline, 2 cc of 0.2% ropivacaine, 1 cc of Decadron 10 mg/cc. Imaging Guidance (Spinal):          Type of Imaging Technique: Fluoroscopy Guidance (Spinal) Indication(s): Assistance in needle guidance and placement for procedures requiring needle placement in or near specific anatomical locations not easily accessible without such assistance. Exposure Time: Please see nurses notes. Contrast: Before injecting any contrast, we confirmed that the patient did not have an allergy to iodine, shellfish, or radiological contrast. Once satisfactory needle placement was completed at the desired level, radiological contrast was injected. Contrast injected under live fluoroscopy. No contrast complications. See chart for type and volume of contrast used. Fluoroscopic Guidance: I was personally present during the use of fluoroscopy. "Tunnel Vision Technique" used to obtain the best possible view of the target area. Parallax error corrected before commencing the procedure. "Direction-depth-direction" technique used to introduce the needle under continuous pulsed fluoroscopy. Once target was reached, antero-posterior, oblique, and lateral fluoroscopic projection used confirm needle placement in all planes. Images permanently stored in EMR. Interpretation: I personally interpreted the imaging intraoperatively. Adequate needle placement confirmed in multiple planes. Appropriate spread of contrast into desired area was observed. No evidence of afferent or efferent intravascular uptake. No intrathecal or subarachnoid spread observed. Permanent images saved into the patient's record.  Antibiotic Prophylaxis:   Anti-infectives (From admission, onward)   None     Indication(s): None identified  Post-operative Assessment:   Post-procedure Vital Signs:  Pulse/HCG Rate: 7278 Temp: 97.9 F (36.6 C) Resp: 20 BP: (!) 146/64 SpO2: 97 %  EBL: None  Complications: No immediate post-treatment complications observed by team, or reported by patient.  Note: The patient tolerated the entire procedure well. A repeat set of vitals were taken after the procedure and the patient was kept under observation following institutional policy, for this type of procedure. Post-procedural neurological assessment was performed, showing return to baseline, prior to discharge. The patient was provided with post-procedure discharge instructions, including a section on how to identify potential problems. Should any problems arise concerning this procedure, the patient was given instructions to immediately contact us, at any time, without hesitation. In any case, we plan to contact the patient by telephone for a follow-up status report regarding this interventional procedure.  Comments:  No additional relevant information. 5 out of 5 strength bilateral lower extremity: Plantar flexion, dorsiflexion, knee flexion, knee extension.  Plan of Care    Imaging Orders     DG C-Arm 1-60 Min-No Report Procedure Orders    No procedure(s) ordered today    Medications ordered for procedure: Meds ordered this encounter  Medications  . iopamidol (ISOVUE-M) 41 % intrathecal injection 10 mL  . ropivacaine (PF) 2 mg/mL (0.2%) (NAROPIN) injection 2 mL  . sodium chloride flush (NS) 0.9 % injection 2 mL  . lidocaine (XYLOCAINE) 2 % (with pres) injection 200 mg  . dexamethasone (DECADRON) injection 10 mg   Medications administered: We administered iopamidol, ropivacaine (PF) 2 mg/mL (0.2%), sodium chloride flush, lidocaine, and dexamethasone.  See the medical record for exact dosing, route, and time of administration.  Disposition: Discharge home  Discharge Date & Time: 05/10/2018; 1355 hrs.   Physician-requested Follow-up: Return in about 3  weeks (around 05/31/2018) for Post Procedure Evaluation.  No future appointments. Primary Care Physician: Kirk Ruths, MD Location: [facility] Note by: Gillis Santa, MD Date: 05/10/2018; Time: 1:55 PM  Disclaimer:  Medicine is not an exact science. The only  guarantee in medicine is that nothing is guaranteed. It is important to note that the decision to proceed with this intervention was based on the information collected from the patient. The Data and conclusions were drawn from the patient's questionnaire, the interview, and the physical examination. Because the information was provided in large part by the patient, it cannot be guaranteed that it has not been purposely or unconsciously manipulated. Every effort has been made to obtain as much relevant data as possible for this evaluation. It is important to note that the conclusions that lead to this procedure are derived in large part from the available data. Always take into account that the treatment will also be dependent on availability of resources and existing treatment guidelines, considered by other Pain Management Practitioners as being common knowledge and practice, at the time of the intervention. For Medico-Legal purposes, it is also important to point out that variation in procedural techniques and pharmacological choices are the acceptable norm. The indications, contraindications, technique, and results of the above procedure should only be interpreted and judged by a Board-Certified Interventional Pain Specialist with extensive familiarity and expertise in the same exact procedure and technique.

## 2018-05-10 NOTE — Patient Instructions (Signed)
____________________________________________________________________________________________  Post-procedure Information What to expect: Most procedures involve the use of a local anesthetic (numbing medicine), and a steroid (anti-inflammatory medicine).  The local anesthetics may cause temporary numbness and weakness of the legs or arms, depending on the location of the block. This numbness/weakness may last 4-6 hours, depending on the local anesthetic used. In rare instances, it can last up to 24 hours. While numb, you must be very careful not to injure the extremity.  After any procedure, you could expect the pain to get better within 15-20 minutes. This relief is temporary and may last 4-6 hours. Once the local anesthetics wears off, you could experience discomfort, possibly more than usual, for up to 10 (ten) days. In the case of radiofrequencies, it may last up to 6 weeks. Surgeries may take up to 8 weeks for the healing process. The discomfort is due to the irritation caused by needles going through skin and muscle. To minimize the discomfort, we recommend using ice the first day, and heat from then on. The ice should be applied for 15 minutes on, and 15 minutes off. Keep repeating this cycle until bedtime. Avoid applying the ice directly to the skin, to prevent frostbite. Heat should be used daily, until the pain improves (4-10 days). Be careful not to burn yourself.  Occasionally you may experience muscle spasms or cramps. These occur as a consequence of the irritation caused by the needle sticks to the muscle and the blood that will inevitably be lost into the surrounding muscle tissue. Blood tends to be very irritating to tissues, which tend to react by going into spasm. These spasms may start the same day of your procedure, but they may also take days to develop. This late onset type of spasm or cramp is usually caused by electrolyte imbalances triggered by the steroids, at the level of the  kidney. Cramps and spasms tend to respond well to muscle relaxants, multivitamins (some are triggered by the procedure, but may have their origins in vitamin deficiencies), and "Gatorade", or any sports drinks that can replenish any electrolyte imbalances. (If you are a diabetic, ask your pharmacist to get you a sugar-free brand.) Warm showers or baths may also be helpful. Stretching exercises are highly recommended.  General Instructions:  Be alert for signs of possible infection: redness, swelling, heat, red streaks, elevated temperature, and/or fever. These typically appear 4 to 6 days after the procedure. Immediately notify your doctor if you experience unusual bleeding, difficulty breathing, or loss of bowel or bladder control. If you experience increased pain, do not increase your pain medicine intake, unless instructed by your pain physician.  Post-Procedure Care:  Be careful in moving about. Muscle spasms in the area of the injection may occur. Applying ice or heat to the area is often helpful. The incidence of spinal headaches after epidural injections ranges between 1.4% and 6%. If you develop a headache that does not seem to respond to conservative therapy, please let your physician know. This can be treated with an epidural blood patch.   Post-procedure numbness or redness is to be expected, however it should average 4 to 6 hours. If numbness and weakness of your extremities begins to develop 4 to 6 hours after your procedure, and is felt to be progressing and worsening, immediately contact your physician.  Diet:  If you experience nausea, do not eat until this sensation goes away. If you had a "Stellate Ganglion Block" for upper extremity "Reflex Sympathetic Dystrophy", do not eat or   drink until your hoarseness goes away. In any case, always start with liquids first and if you tolerate them well, then slowly progress to more solid foods.  Activity:  For the first 4 to 6 hours after the  procedure, use caution in moving about as you may experience numbness and/or weakness. Use caution in cooking, using household electrical appliances, and climbing steps. If you need to reach your Doctor call our office: [phone] (During business hours) or [phone] (After business hours).  Business Hours: Monday-Thursday 8:00 am - 4:00 PM    Fridays: Closed     In case of an emergency: In case of emergency, call 911 or go to the nearest emergency room and have the physician there call us.  Interpretation of Procedure Every nerve block has two components: a diagnostic component, and a treatment component. Unrealistic expectations are the most common causes of "perceived failure".  In a perfect world, a single nerve block should be able to completely and permanently eliminate the pain. Sadly, the world is not perfect.  Most pain management nerve blocks are performed using local anesthetics and steroids. Steroids are responsible for any long-term benefit that you may experience. Their purpose is to decrease any chronic swelling that may exist in the area. Steroids begin to work immediately after being injected. However, most patients will not experience any benefits until 5 to 10 days after the injection, when the swelling has come down to the point where they can tell a difference. Steroids will only help if there is swelling to be treated. As such, they can assist with the diagnosis. If effective, they suggest an inflammatory component to the pain, and if ineffective, they rule out inflammation as the main cause or component of the problem. If the problem is one of mechanical compression, you will get no benefit from those steroids.   In the case of local anesthetics, they have a crucial role in the diagnosis of your condition. Most will begin to work within15 to 20 minutes after injection. The duration will depend on the type used (short- vs. Long-acting). It is of outmost importance that  patients keep tract of their pain, after the procedure. To assist with this matter, a "Post-procedure Pain Diary" is provided. Make sure to complete it and to bring it back to your follow-up appointment.  As long as the patient keeps accurate, detailed records of their symptoms after every procedure, and returns to have those interpreted, every procedure will provide us with invaluable information. Even a block that does not provide the patient with any relief, will always provide us with information about the mechanism and the origin of the pain. The only time a nerve block can be considered a waste of time is when patients do not keep track of the results, or do not keep their post-procedure appointment.  Reporting the results back to your physician The Pain Score  Pain is a subjective complaint. It cannot be seen, touched, or measured. We depend entirely on the patient's report of the pain in order to assess your condition and treatment. To evaluate the pain, we use a pain scale, where "0" means "No Pain", and a "10" is "the worst possible pain that you can even imagine" (i.e. something like been eaten alive by a shark or being torn apart by a lion).   Use the Pain Scale provided. You will frequently be asked to rate your pain. Please be accurate, remember that medical decisions will be based on your   responses. Please do not rate your pain above a 10. Doing so is actually interpreted as "symptom magnification" (exaggeration). To put this into perspective, when you tell us that your pain is at a 10 (ten), what you are saying is that there is nothing we can do to make this pain any worse. (Carefully think about that.) ____________________________________________________________________________________________   Pain Management Discharge Instructions  General Discharge Instructions :  If you need to reach your doctor call: Monday-Friday 8:00 am - 4:00 pm at [phone] or toll free [phone].   After clinic hours [phone] to have operator reach doctor.  Bring all of your medication bottles to all your appointments in the pain clinic.  To cancel or reschedule your appointment with Pain Management please remember to call 24 hours in advance to avoid a fee.  Refer to the educational materials which you have been given on: General Risks, I had my Procedure. Discharge Instructions, Post Sedation.  Post Procedure Instructions:  Please notify your doctor immediately if you have any unusual bleeding, trouble breathing or pain that is not related to your normal pain.  Depending on the type of procedure that was done, some parts of your body may feel week and/or numb.  This usually clears up by tonight or the next day.  Walk with the use of an assistive device or accompanied by an adult for the 24 hours.  You may use ice on the affected area for the first 24 hours.  Put ice in a Ziploc bag and cover with a towel and place against area 15 minutes on 15 minutes off.  You may switch to heat after 24 hours.  ______________________________________________________________________________________________  Specialty Pain Scale  Introduction:  There are significant differences in how pain is reported. The word pain usually refers to physical pain, but it is also a common synonym of suffering. The medical community uses a scale from 0 (zero) to 10 (ten) to report pain level. Zero (0) is described as "no pain", while ten (10) is described as "the worse pain you can imagine". The problem with this scale is that physical pain is reported along with suffering. Suffering refers to mental pain, or more often yet it refers to any unpleasant feeling, emotion or aversion associated with the perception of harm or threat of harm. It is the psychological component of pain.  Pain Specialists prefer to separate the two components. The pain scale used by this practice is the Verbal Numerical Rating Scale  (VNRS-11). This scale is for the physical pain only. DO NOT INCLUDE how your pain psychologically affects you. This scale is for adults 12 years of age and older. It has 11 (eleven) levels. The 1st level is 0/10. This means: "right now, I have no pain". In the context of pain management, it also means: "right now, my physical pain is under control with the current therapy".  General Information:  The scale should reflect your current level of pain. Unless you are specifically asked for the level of your worst pain, or your average pain. If you are asked for one of these two, then it should be understood that it is over the past 24 hours.  Levels 1 (one) through 5 (five) are described below, and can be treated as an outpatient. Ambulatory pain management facilities such as ours are more than adequate to treat these levels. Levels 6 (six) through 10 (ten) are also described below, however, these must be treated as a hospitalized patient. While levels 6 (six) and  7 (seven) may be evaluated at an urgent care facility, levels 8 (eight) through 10 (ten) constitute medical emergencies and as such, they belong in a hospital's emergency department. When having these levels (as described below), do not come to our office. Our facility is not equipped to manage these levels. Go directly to an urgent care facility or an emergency department to be evaluated.  Definitions:  Activities of Daily Living (ADL): Activities of daily living (ADL or ADLs) is a term used in healthcare to refer to people's daily self-care activities. Health professionals often use a person's ability or inability to perform ADLs as a measurement of their functional status, particularly in regard to people post injury, with disabilities and the elderly. There are two ADL levels: Basic and Instrumental. Basic Activities of Daily Living (BADL  or BADLs) consist of self-care tasks that include: Bathing and showering; personal hygiene and grooming  (including brushing/combing/styling hair); dressing; Toilet hygiene (getting to the toilet, cleaning oneself, and getting back up); eating and self-feeding (not including cooking or chewing and swallowing); functional mobility, often referred to as "transferring", as measured by the ability to walk, get in and out of bed, and get into and out of a chair; the broader definition (moving from one place to another while performing activities) is useful for people with different physical abilities who are still able to get around independently. Basic ADLs include the things many people do when they get up in the morning and get ready to go out of the house: get out of bed, go to the toilet, bathe, dress, groom, and eat. On the average, loss of function typically follows a particular order. Hygiene is the first to go, followed by loss of toilet use and locomotion. The last to go is the ability to eat. When there is only one remaining area in which the person is independent, there is a 62.9% chance that it is eating and only a 3.5% chance that it is hygiene. Instrumental Activities of Daily Living (IADL or IADLs) are not necessary for fundamental functioning, but they let an individual live independently in a community. IADL consist of tasks that include: cleaning and maintaining the house; home establishment and maintenance; care of others (including selecting and supervising caregivers); care of pets; child rearing; managing money; managing financials (investments, etc.); meal preparation and cleanup; shopping for groceries and necessities; moving within the community; safety procedures and emergency responses; health management and maintenance (taking prescribed medications); and using the telephone or other form of communication.  Instructions:  Most patients tend to report their pain as a combination of two factors, their physical pain and their psychosocial pain. This last one is also known as "suffering" and it  is reflection of how physical pain affects you socially and psychologically. From now on, report them separately.  From this point on, when asked to report your pain level, report only your physical pain. Use the following table for reference.  Pain Clinic Pain Levels (0-5/10)  Pain Level Score  Description  No Pain 0   Mild pain 1 Nagging, annoying, but does not interfere with basic activities of daily living (ADL). Patients are able to eat, bathe, get dressed, toileting (being able to get on and off the toilet and perform personal hygiene functions), transfer (move in and out of bed or a chair without assistance), and maintain continence (able to control bladder and bowel functions). Blood pressure and heart rate are unaffected. A normal heart rate for a healthy adult ranges  from 60 to 100 bpm (beats per minute).   Mild to moderate pain 2 Noticeable and distracting. Impossible to hide from other people. More frequent flare-ups. Still possible to adapt and function close to normal. It can be very annoying and may have occasional stronger flare-ups. With discipline, patients may get used to it and adapt.   Moderate pain 3 Interferes significantly with activities of daily living (ADL). It becomes difficult to feed, bathe, get dressed, get on and off the toilet or to perform personal hygiene functions. Difficult to get in and out of bed or a chair without assistance. Very distracting. With effort, it can be ignored when deeply involved in activities.   Moderately severe pain 4 Impossible to ignore for more than a few minutes. With effort, patients may still be able to manage work or participate in some social activities. Very difficult to concentrate. Signs of autonomic nervous system discharge are evident: dilated pupils (mydriasis); mild sweating (diaphoresis); sleep interference. Heart rate becomes elevated (>115 bpm). Diastolic blood pressure (lower number) rises above 100 mmHg. Patients find relief  in laying down and not moving.   Severe pain 5 Intense and extremely unpleasant. Associated with frowning face and frequent crying. Pain overwhelms the senses.  Ability to do any activity or maintain social relationships becomes significantly limited. Conversation becomes difficult. Pacing back and forth is common, as getting into a comfortable position is nearly impossible. Pain wakes you up from deep sleep. Physical signs will be obvious: pupillary dilation; increased sweating; goosebumps; brisk reflexes; cold, clammy hands and feet; nausea, vomiting or dry heaves; loss of appetite; significant sleep disturbance with inability to fall asleep or to remain asleep. When persistent, significant weight loss is observed due to the complete loss of appetite and sleep deprivation.  Blood pressure and heart rate becomes significantly elevated. Caution: If elevated blood pressure triggers a pounding headache associated with blurred vision, then the patient should immediately seek attention at an urgent or emergency care unit, as these may be signs of an impending stroke.    Emergency Department Pain Levels (6-10/10)  Emergency Room Pain 6 Severely limiting. Requires emergency care and should not be seen or managed at an outpatient pain management facility. Communication becomes difficult and requires great effort. Assistance to reach the emergency department may be required. Facial flushing and profuse sweating along with potentially dangerous increases in heart rate and blood pressure will be evident.   Distressing pain 7 Self-care is very difficult. Assistance is required to transport, or use restroom. Assistance to reach the emergency department will be required. Tasks requiring coordination, such as bathing and getting dressed become very difficult.   Disabling pain 8 Self-care is no longer possible. At this level, pain is disabling. The individual is unable to do even the most "basic" activities such as  walking, eating, bathing, dressing, transferring to a bed, or toileting. Fine motor skills are lost. It is difficult to think clearly.   Incapacitating pain 9 Pain becomes incapacitating. Thought processing is no longer possible. Difficult to remember your own name. Control of movement and coordination are lost.   The worst pain imaginable 10 At this level, most patients pass out from pain. When this level is reached, collapse of the autonomic nervous system occurs, leading to a sudden drop in blood pressure and heart rate. This in turn results in a temporary and dramatic drop in blood flow to the brain, leading to a loss of consciousness. Fainting is one of the body's self defense  mechanisms. Passing out puts the brain in a calmed state and causes it to shut down for a while, in order to begin the healing process.    Summary: 1. Refer to this scale when providing [place] with your pain level. 2. Be accurate and careful when reporting your pain level. This will help with your care. 3. Over-reporting your pain level will lead to loss of credibility. 4. Even a level of 1/10 means that there is pain and will be treated at our facility. 5. High, inaccurate reporting will be documented as "Symptom Exaggeration", leading to loss of credibility and suspicions of possible secondary gains such as obtaining more narcotics, or wanting to appear disabled, for fraudulent reasons. 6. Only pain levels of 5 or below will be seen at our facility. 7. Pain levels of 6 and above will be sent to the Emergency Department and the appointment cancelled. ______________________________________________________________________________________________

## 2018-05-10 NOTE — Progress Notes (Signed)
Safety precautions to be maintained throughout the outpatient stay will include: orient to surroundings, keep bed in low position, maintain call bell within reach at all times, provide assistance with transfer out of bed and ambulation.  

## 2018-05-11 ENCOUNTER — Telehealth: Payer: Self-pay

## 2018-05-11 NOTE — Telephone Encounter (Signed)
Pt was called and no problems was reported. 

## 2018-05-17 DIAGNOSIS — S76211A Strain of adductor muscle, fascia and tendon of right thigh, initial encounter: Secondary | ICD-10-CM | POA: Diagnosis not present

## 2018-05-19 DIAGNOSIS — I10 Essential (primary) hypertension: Secondary | ICD-10-CM | POA: Diagnosis not present

## 2018-05-19 DIAGNOSIS — E7800 Pure hypercholesterolemia, unspecified: Secondary | ICD-10-CM | POA: Diagnosis not present

## 2018-05-19 DIAGNOSIS — E118 Type 2 diabetes mellitus with unspecified complications: Secondary | ICD-10-CM | POA: Diagnosis not present

## 2018-05-24 ENCOUNTER — Encounter: Payer: Self-pay | Admitting: Student in an Organized Health Care Education/Training Program

## 2018-05-26 ENCOUNTER — Other Ambulatory Visit
Admission: RE | Admit: 2018-05-26 | Discharge: 2018-05-26 | Disposition: A | Discharge status: Home / Self Care | Payer: Medicare HMO | Source: Ambulatory Visit | Attending: Internal Medicine | Admitting: Internal Medicine

## 2018-05-26 DIAGNOSIS — E118 Type 2 diabetes mellitus with unspecified complications: Secondary | ICD-10-CM | POA: Diagnosis not present

## 2018-05-26 DIAGNOSIS — I10 Essential (primary) hypertension: Secondary | ICD-10-CM | POA: Diagnosis not present

## 2018-05-26 DIAGNOSIS — D869 Sarcoidosis, unspecified: Secondary | ICD-10-CM | POA: Insufficient documentation

## 2018-05-26 DIAGNOSIS — F325 Major depressive disorder, single episode, in full remission: Secondary | ICD-10-CM | POA: Diagnosis not present

## 2018-05-26 LAB — COMPREHENSIVE METABOLIC PANEL WITH GFR
ALT: 28 U/L (ref 0–44)
AST: 21 U/L (ref 15–41)
Albumin: 4.1 g/dL (ref 3.5–5.0)
Alkaline Phosphatase: 86 U/L (ref 38–126)
Anion gap: 7 (ref 5–15)
BUN: 24 mg/dL — ABNORMAL HIGH (ref 8–23)
CO2: 28 mmol/L (ref 22–32)
Calcium: 9.1 mg/dL (ref 8.9–10.3)
Chloride: 100 mmol/L (ref 98–111)
Creatinine, Ser: 0.87 mg/dL (ref 0.61–1.24)
GFR calc Af Amer: >60 mL/min
GFR calc non Af Amer: >60 mL/min
Glucose, Bld: 242 mg/dL — ABNORMAL HIGH (ref 70–99)
Potassium: 4.2 mmol/L (ref 3.5–5.1)
Sodium: 135 mmol/L (ref 135–145)
Total Bilirubin: 0.9 mg/dL (ref 0.3–1.2)
Total Protein: 6.5 g/dL (ref 6.5–8.1)

## 2018-05-29 ENCOUNTER — Encounter: Payer: Self-pay | Admitting: Student in an Organized Health Care Education/Training Program

## 2018-06-02 ENCOUNTER — Encounter: Payer: Self-pay | Admitting: Student in an Organized Health Care Education/Training Program

## 2018-06-02 ENCOUNTER — Ambulatory Visit: Payer: Medicare HMO | Admitting: Student in an Organized Health Care Education/Training Program

## 2018-06-02 ENCOUNTER — Other Ambulatory Visit: Payer: Self-pay

## 2018-06-02 ENCOUNTER — Ambulatory Visit
Admission: RE | Admit: 2018-06-02 | Discharge: 2018-06-02 | Disposition: A | Discharge status: Home / Self Care | Payer: Medicare HMO | Source: Ambulatory Visit | Attending: Student in an Organized Health Care Education/Training Program | Admitting: Student in an Organized Health Care Education/Training Program

## 2018-06-02 ENCOUNTER — Ambulatory Visit
Admission: RE | Admit: 2018-06-02 | Discharge: 2018-06-02 | Disposition: A | Discharge status: Home / Self Care | Payer: Medicare HMO | Source: Ambulatory Visit | Attending: Internal Medicine | Admitting: Internal Medicine

## 2018-06-02 VITALS — BP 113/89 | HR 61 | Temp 98.1°F | Resp 16 | Ht 72.0 in | Wt 235.0 lb

## 2018-06-02 DIAGNOSIS — D869 Sarcoidosis, unspecified: Secondary | ICD-10-CM | POA: Diagnosis not present

## 2018-06-02 DIAGNOSIS — M5136 Other intervertebral disc degeneration, lumbar region: Secondary | ICD-10-CM

## 2018-06-02 DIAGNOSIS — G8929 Other chronic pain: Secondary | ICD-10-CM

## 2018-06-02 DIAGNOSIS — M533 Sacrococcygeal disorders, not elsewhere classified: Secondary | ICD-10-CM | POA: Insufficient documentation

## 2018-06-02 DIAGNOSIS — M47816 Spondylosis without myelopathy or radiculopathy, lumbar region: Secondary | ICD-10-CM | POA: Insufficient documentation

## 2018-06-02 DIAGNOSIS — G894 Chronic pain syndrome: Secondary | ICD-10-CM | POA: Insufficient documentation

## 2018-06-02 DIAGNOSIS — M5416 Radiculopathy, lumbar region: Secondary | ICD-10-CM | POA: Insufficient documentation

## 2018-06-02 DIAGNOSIS — M48061 Spinal stenosis, lumbar region without neurogenic claudication: Secondary | ICD-10-CM | POA: Insufficient documentation

## 2018-06-02 NOTE — Progress Notes (Signed)
Safety precautions to be maintained throughout the outpatient stay will include: orient to surroundings, keep bed in low position, maintain call bell within reach at all times, provide assistance with transfer out of bed and ambulation.  

## 2018-06-02 NOTE — Progress Notes (Signed)
Patient's Name: [name]  MRN: [id number]  Referring Provider: Kirk Ruths, MD  DOB: 1945-09-02  PCP: Kirk Ruths, MD  DOS: 06/02/2018  Note by: Gillis Santa, MD  Service setting: Ambulatory outpatient  Specialty: Interventional Pain Management  Location: [facility] (AMB) Pain Management Facility    Patient type: Established   Primary Reason(s) for Visit: Encounter for post-procedure evaluation of chronic illness with mild to moderate exacerbation CC: Back Pain (lower)  HPI  George Daniels is a 73 y.o. year old, male patient, who comes today for a post-procedure evaluation. He has Benign essential hypertension; Chronic pain in right shoulder; Diabetes mellitus without complication ([place]); Health care maintenance; Hypertension; Lateral epicondylitis of right elbow; Low back pain; Lumbar stenosis; Major depression in remission ([place]); Pain of left sacroiliac joint; Pure hypercholesterolemia; Restless legs syndrome; Sprain of wrist, left; and Spondylosis of lumbar region without myelopathy or radiculopathy on their problem list. His primarily concern today is the Back Pain (lower)  Pain Assessment: Location: Lower Back Radiating: left buttock Onset: More than a month ago Duration: Chronic pain Quality: Aching, Dull Severity: 4 /10 (subjective, self-reported pain score)  Note: Reported level is compatible with observation.                         When using our objective Pain Scale, levels between 6 and 10/10 are said to belong in an emergency room, as it progressively worsens from a 6/10, described as severely limiting, requiring emergency care not usually available at an outpatient pain management facility. At a 6/10 level, communication becomes difficult and requires great effort. Assistance to reach the emergency department may be required. Facial flushing and profuse sweating along with potentially dangerous increases in heart rate and blood pressure will be evident. Effect on ADL:    Timing: Constant Modifying factors: nothing BP: 113/89  HR: 61  George Daniels comes in today for post-procedure evaluation.  Further details on both, my assessment(s), as well as the proposed treatment plan, please see below.  Post-Procedure Assessment  05/10/2018 Procedure: Left L2-L3 ESI #1 Pre-procedure pain score:  4/10 Post-procedure pain score: 0/10         Influential Factors: BMI: 31.87 kg/m Intra-procedural challenges: None observed.         Assessment challenges: None detected.              Reported side-effects: None.        Post-procedural adverse reactions or complications: None reported         Sedation: Please see nurses note. When no sedatives are used, the analgesic levels obtained are directly associated to the effectiveness of the local anesthetics. However, when sedation is provided, the level of analgesia obtained during the initial 1 hour following the intervention, is believed to be the result of a combination of factors. These factors may include, but are not limited to: 1. The effectiveness of the local anesthetics used. 2. The effects of the analgesic(s) and/or anxiolytic(s) used. 3. The degree of discomfort experienced by the patient at the time of the procedure. 4. The patients ability and reliability in recalling and recording the events. 5. The presence and influence of possible secondary gains and/or psychosocial factors. Reported result: Relief experienced during the 1st hour after the procedure: 50 % (Ultra-Short Term Relief)            Interpretative annotation: Clinically appropriate result. Analgesia during this period is likely to be Local Anesthetic and/or IV Sedative (  Analgesic/Anxiolytic) related.          Effects of local anesthetic: The analgesic effects attained during this period are directly associated to the localized infiltration of local anesthetics and therefore cary significant diagnostic value as to the etiological location, or  anatomical origin, of the pain. Expected duration of relief is directly dependent on the pharmacodynamics of the local anesthetic used. Long-acting (4-6 hours) anesthetics used.  Reported result: Relief during the next 4 to 6 hour after the procedure: 50 % (Short-Term Relief)            Interpretative annotation: Clinically appropriate result. Analgesia during this period is likely to be Local Anesthetic-related.          Long-term benefit: Defined as the period of time past the expected duration of local anesthetics (1 hour for short-acting and 4-6 hours for long-acting). With the possible exception of prolonged sympathetic blockade from the local anesthetics, benefits during this period are typically attributed to, or associated with, other factors such as analgesic sensory neuropraxia, antiinflammatory effects, or beneficial biochemical changes provided by agents other than the local anesthetics.  Reported result: Extended relief following procedure: 0 % (Long-Term Relief)            Interpretative annotation: Clinically possible results. No benefit. No permanent benefit expected. Pain appears to be refractory to this treatment modality.          Current benefits: Defined as reported results that persistent at this point in time.   Analgesia: 0 %            Function: No improvement ROM: No improvement Interpretative annotation: Recurrence of symptoms. Therapeutic failure. Results would argue against repeating therapy.          Interpretation: Results would suggest a successful diagnostic intervention.                  Plan:  Please see "Plan of Care" for details.                Laboratory Chemistry  Inflammation Markers (CRP: Acute Phase) (ESR: Chronic Phase) No results found for: CRP, ESRSEDRATE, LATICACIDVEN                       Rheumatology Markers No results found for: RF, ANA, LABURIC, URICUR, LYMEIGGIGMAB, LYMEABIGMQN, HLAB27                      Renal Function Markers Lab  Results  Component Value Date   BUN 24 (H) 05/26/2018   CREATININE 0.87 05/26/2018   GFRAA >60 05/26/2018   GFRNONAA >60 05/26/2018                             Hepatic Function Markers Lab Results  Component Value Date   AST 21 05/26/2018   ALT 28 05/26/2018   ALBUMIN 4.1 05/26/2018   ALKPHOS 86 05/26/2018                        Electrolytes Lab Results  Component Value Date   NA 135 05/26/2018   K 4.2 05/26/2018   CL 100 05/26/2018   CALCIUM 9.1 05/26/2018                        Neuropathy Markers No results found for: VITAMINB12, FOLATE, HGBA1C, HIV  CNS Tests No results found for: COLORCSF, APPEARCSF, RBCCOUNTCSF, WBCCSF, POLYSCSF, LYMPHSCSF, EOSCSF, PROTEINCSF, GLUCCSF, JCVIRUS, CSFOLI, IGGCSF                      Bone Pathology Markers Lab Results  Component Value Date   TESTOSTERONE 237 (L) 04/03/2017                         Coagulation Parameters Lab Results  Component Value Date   INR 1.05 04/30/2017   LABPROT 13.6 04/30/2017   APTT 30 04/30/2017   PLT 256 03/26/2018                        Cardiovascular Markers Lab Results  Component Value Date   HGB 14.7 03/26/2018   HCT 43.7 03/26/2018                         CA Markers No results found for: CEA, CA125, LABCA2                      Endocrine Markers Lab Results  Component Value Date   TESTOSTERONE 237 (L) 04/03/2017                        Note: Lab results reviewed.  Recent Diagnostic Imaging Results  DG C-Arm 1-60 Min-No Report Fluoroscopy was utilized by the requesting physician.  No radiographic  interpretation.   Complexity Note: Imaging results reviewed. Results shared with George Daniels, using Layman's terms.                         Meds   Current Outpatient Medications:  .  ALPRAZolam (XANAX) 0.5 MG tablet, Take 0.5 mg by mouth daily as needed for anxiety. , Disp: , Rfl:  .  amLODipine (NORVASC) 5 MG tablet, Take 5 mg by mouth daily. , Disp: , Rfl:  .   atorvastatin (LIPITOR) 10 MG tablet, Take 10 mg by mouth every morning., Disp: , Rfl:  .  citalopram (CELEXA) 40 MG tablet, Take 40 mg by mouth every morning., Disp: , Rfl:  .  clotrimazole (LOTRIMIN) 1 % cream, Apply 1 application topically every other day., Disp: , Rfl:  .  gabapentin (NEURONTIN) 300 MG capsule, Take 300-600 mg by mouth See admin instructions. Take 300 mg by mouth in the morning and evening with dinner and take 600 mg, '300mg'$  at bedtime., Disp: , Rfl:  .  hydrocortisone cream 1 %, Apply 1 application topically every other day., Disp: , Rfl:  .  lisinopril (PRINIVIL,ZESTRIL) 20 MG tablet, Take 20 mg by mouth daily., Disp: , Rfl:  .  omeprazole (PRILOSEC) 40 MG capsule, Take 40 mg by mouth every evening., Disp: , Rfl:  .  predniSONE (DELTASONE) 10 MG tablet, Take 1 tablet (10 mg total) by mouth daily with breakfast., Disp: 90 tablet, Rfl: 3 .  rOPINIRole (REQUIP) 3 MG tablet, Take 3 mg by mouth daily after supper. , Disp: , Rfl:  .  sildenafil (REVATIO) 20 MG tablet, Take 20 mg by mouth daily as needed (ED)., Disp: , Rfl:  .  tiZANidine (ZANAFLEX) 2 MG tablet, Take 2 mg by mouth every 8 (eight) hours as needed for muscle spasms. , Disp: , Rfl:  .  traZODone (DESYREL) 100 MG tablet, Take 100 mg by mouth at bedtime., Disp: ,  Rfl:  .  aspirin 81 MG tablet, Take 81 mg by mouth every morning., Disp: , Rfl:  .  lisinopril-hydrochlorothiazide (PRINZIDE,ZESTORETIC) 20-25 MG tablet, Take 1 tablet by mouth every morning., Disp: , Rfl:  .  metFORMIN (GLUCOPHAGE-XR) 500 MG 24 hr tablet, Take 500 mg by mouth daily with supper. , Disp: , Rfl:  .  methotrexate (RHEUMATREX) 2.5 MG tablet, Take 5 tablets (12.5 mg total) by mouth once a week. Caution:Chemotherapy. Protect from light. (Patient not taking: Reported on 06/02/2018), Disp: 75 tablet, Rfl: 3  ROS  Constitutional: Denies any fever or chills Gastrointestinal: No reported hemesis, hematochezia, vomiting, or acute GI distress Musculoskeletal:  Denies any acute onset joint swelling, redness, loss of ROM, or weakness Neurological: No reported episodes of acute onset apraxia, aphasia, dysarthria, agnosia, amnesia, paralysis, loss of coordination, or loss of consciousness  Allergies  George Daniels is allergic to penicillins.  [place]  Drug: George Daniels  reports no history of drug use. Alcohol:  reports current alcohol use. Tobacco:  reports that he has never smoked. He has never used smokeless tobacco. Medical:  has a past medical history of Arthritis, Complication of anesthesia, Depression, Diabetes mellitus without complication ([place]), Family history of adverse reaction to anesthesia, GERD (gastroesophageal reflux disease), Hypertension, Nausea, RLS (restless legs syndrome), Sarcoidosis, and Sleep apnea. Surgical: George Daniels  has a past surgical history that includes Tonsillectomy; Achilles tendon repair (Right, 2014); Shoulder arthroscopy with open rotator cuff repair (Right, 08/02/2015); Tennis elbow release/nirschel procedure (Right, 10/02/2016); Cataract extraction, bilateral; and Excision mass neck (Right, 03/31/2018). Family: family history is not on file.  Constitutional Exam  General appearance: Well nourished, well developed, and well hydrated. In no apparent acute distress Vitals:   06/02/18 1111  BP: 113/89  Pulse: 61  Resp: 16  Temp: 98.1 F (36.7 C)  TempSrc: Oral  SpO2: 98%  Weight: 235 lb (106.6 kg)  Height: 6' (1.829 m)   BMI Assessment: Estimated body mass index is 31.87 kg/m as calculated from the following:   Height as of this encounter: 6' (1.829 m).   Weight as of this encounter: 235 lb (106.6 kg).  BMI interpretation table: BMI level Category Range association with higher incidence of chronic pain  <18 kg/m2 Underweight   18.5-24.9 kg/m2 Ideal body weight   25-29.9 kg/m2 Overweight Increased incidence by 20%  30-34.9 kg/m2 Obese (Class I) Increased incidence by 68%  35-39.9 kg/m2 Severe obesity (Class  II) Increased incidence by 136%  >40 kg/m2 Extreme obesity (Class III) Increased incidence by 254%   Patient's current BMI Ideal Body weight  Body mass index is 31.87 kg/m. Ideal body weight: 77.6 kg (171 lb 1.2 oz) Adjusted ideal body weight: 89.2 kg (196 lb 10.3 oz)   BMI Readings from Last 4 Encounters:  06/02/18 31.87 kg/m  05/10/18 31.87 kg/m  04/28/18 31.87 kg/m  03/31/18 32.14 kg/m   Wt Readings from Last 4 Encounters:  06/02/18 235 lb (106.6 kg)  05/10/18 235 lb (106.6 kg)  04/28/18 235 lb (106.6 kg)  03/31/18 237 lb (107.5 kg)  Psych/Mental status: Alert, oriented x 3 (person, place, & time)       Eyes: PERLA Respiratory: No evidence of acute respiratory distress  Cervical Spine Area Exam  Skin & Axial Inspection: No masses, redness, edema, swelling, or associated skin lesions Alignment: Symmetrical Functional ROM: Unrestricted ROM      Stability: No instability detected Muscle Tone/Strength: Functionally intact. No obvious neuro-muscular anomalies detected. Sensory (Neurological): Unimpaired Palpation: No palpable anomalies  Upper Extremity (UE) Exam    Side: Right upper extremity  Side: Left upper extremity  Skin & Extremity Inspection: Skin color, temperature, and hair growth are WNL. No peripheral edema or cyanosis. No masses, redness, swelling, asymmetry, or associated skin lesions. No contractures.  Skin & Extremity Inspection: Skin color, temperature, and hair growth are WNL. No peripheral edema or cyanosis. No masses, redness, swelling, asymmetry, or associated skin lesions. No contractures.  Functional ROM: Unrestricted ROM          Functional ROM: Unrestricted ROM          Muscle Tone/Strength: Functionally intact. No obvious neuro-muscular anomalies detected.  Muscle Tone/Strength: Functionally intact. No obvious neuro-muscular anomalies detected.  Sensory (Neurological): Unimpaired          Sensory (Neurological): Unimpaired           Palpation: No palpable anomalies              Palpation: No palpable anomalies              Provocative Test(s):  Phalen's test: deferred Tinel's test: deferred Apley's scratch test (touch opposite shoulder):  Action 1 (Across chest): deferred Action 2 (Overhead): deferred Action 3 (LB reach): deferred   Provocative Test(s):  Phalen's test: deferred Tinel's test: deferred Apley's scratch test (touch opposite shoulder):  Action 1 (Across chest): deferred Action 2 (Overhead): deferred Action 3 (LB reach): deferred    Thoracic Spine Area Exam  Skin & Axial Inspection: No masses, redness, or swelling Alignment: Symmetrical Functional ROM: Unrestricted ROM Stability: No instability detected Muscle Tone/Strength: Functionally intact. No obvious neuro-muscular anomalies detected. Sensory (Neurological): Unimpaired Muscle strength & Tone: No palpable anomalies  Lumbar Spine Area Exam  Skin & Axial Inspection: No masses, redness, or swelling Alignment: Symmetrical Functional ROM: Decreased ROM       Stability: No instability detected Muscle Tone/Strength: Functionally intact. No obvious neuro-muscular anomalies detected. Sensory (Neurological): Musculoskeletal pain pattern Palpation: Complains of area being tender to palpation left flank       Provocative Tests: Hyperextension/rotation test: (+) bilaterally for facet joint pain. Lumbar quadrant test (Kemp's test): (+) bilaterally for facet joint pain. Lateral bending test: deferred today       Patrick's Maneuver: (+) for left-sided S-I arthralgia             FABER* test: (+) for left-sided S-I arthralgia             S-I anterior distraction/compression test: deferred today         S-I lateral compression test: deferred today         S-I Thigh-thrust test: deferred today         S-I Gaenslen's test: deferred today         *(Flexion, ABduction and External Rotation)  Gait & Posture Assessment  Ambulation: Unassisted Gait:  Relatively normal for age and body habitus Posture: WNL   Lower Extremity Exam    Side: Right lower extremity  Side: Left lower extremity  Stability: No instability observed          Stability: No instability observed          Skin & Extremity Inspection: Skin color, temperature, and hair growth are WNL. No peripheral edema or cyanosis. No masses, redness, swelling, asymmetry, or associated skin lesions. No contractures.  Skin & Extremity Inspection: Skin color, temperature, and hair growth are WNL. No peripheral edema or cyanosis. No masses, redness, swelling, asymmetry, or associated skin lesions. No contractures.  Functional  ROM: Unrestricted ROM                  Functional ROM: Unrestricted ROM                  Muscle Tone/Strength: Functionally intact. No obvious neuro-muscular anomalies detected.  Muscle Tone/Strength: Functionally intact. No obvious neuro-muscular anomalies detected.  Sensory (Neurological): Unimpaired        Sensory (Neurological): Unimpaired        DTR: Patellar: deferred today Achilles: deferred today Plantar: deferred today  DTR: Patellar: deferred today Achilles: deferred today Plantar: deferred today  Palpation: No palpable anomalies  Palpation: No palpable anomalies   Assessment   Status Diagnosis  Having a Flare-up Having a Flare-up Persistent 1. Lumbar spondylosis   2. Lumbar facet arthropathy   3. Lumbar degenerative disc disease   4. Chronic pain syndrome   5. Lumbar radiculopathy   6. Lumbar foraminal stenosis   7. Chronic SI joint pain      Patient follows up status post left L2-L3 epidural steroid injection which he states was not very beneficial.  Patient primarily complains of axial low back pain.  After reviewing his lumbar MRI, patient does have evidence of lumbar facet pathology.  George Daniels has a history of greater than 3 months of moderate to severe pain which is resulted in functional impairment.  The patient has tried various  conservative therapeutic options such as NSAIDs, Tylenol, muscle relaxants, physical therapy which was inadequately effective.  Patient's pain is predominantly axial with physical exam findings suggestive of facet arthropathy.  Lumbar facet medial branch nerve blocks were discussed with the patient.  Risks and benefits were reviewed.  Patient would like to proceed with bilateral L3, L4, L5, S1 medial branch nerve block.  Patient is also endorsing left hip and left groin pain.  This could be secondary to SI joint dysfunction.  Will obtain bilateral sacroiliac joint x-rays.  Pending results can consider diagnostic left sacroiliac joint injection.   Lab-work, procedure(s), and/or referral(s): Orders Placed This Encounter  Procedures  . LUMBAR FACET(MEDIAL BRANCH NERVE BLOCK) MBNB  . DG Si Joints    Future considerations:  Lumbar radiofrequency ablation Left sacroiliac joint injection Sacral lateral branch block.  Provider-requested follow-up: Return for Procedure.  Future Appointments  Date Time Provider [place]  06/02/2018  1:30 PM Gillis Santa, MD [facility] None   Time Note: Greater than 50% of the 25 minute(s) of face-to-face time spent with George Daniels, was spent in counseling/coordination of care regarding: George Daniels primary cause of pain, the treatment plan, treatment alternatives, the risks and possible complications of proposed treatment, the results, interpretation and significance of  his recent diagnostic interventional treatment(s), realistic expectations and the goals of pain management (increased in functionality).  Primary Care Physician: Kirk Ruths, MD Location: [facility] Note by: Gillis Santa, M.D Date: 06/02/2018; Time: 12:01 PM  Patient Instructions  Preparing for your procedure (without sedation) Instructions: . Oral Intake: Do not eat or drink anything for at least 3 hours prior to your  procedure. . Transportation: Unless otherwise stated by your physician, you may drive yourself after the procedure. . Blood Pressure Medicine: Take your blood pressure medicine with a sip of water the morning of the procedure. . Insulin: Take only  of your normal insulin dose. . Preventing infections: Shower with an antibacterial soap the morning of your procedure. . Build-up your immune system: Take 1000 mg of Vitamin C with every  meal (3 times a day) the day prior to your procedure. . Pregnancy: If you are pregnant, call and cancel the procedure. . Sickness: If you have a cold, fever, or any active infections, call and cancel the procedure. . Arrival: You must be in the facility at least 30 minutes prior to your scheduled procedure. . Children: Do not bring any children with you. . Dress appropriately: Bring dark clothing that you would not mind if they get stained. . Valuables: Do not bring any jewelry or valuables. Procedure appointments are reserved for interventional treatments only. Marland Kitchen No Prescription Refills. . No medication changes will be discussed during procedure appointments. . No disability issues will be discussed.

## 2018-06-02 NOTE — Patient Instructions (Signed)

## 2018-06-14 NOTE — Telephone Encounter (Signed)
DR please advise. Thanks 

## 2018-06-16 DIAGNOSIS — G2581 Restless legs syndrome: Secondary | ICD-10-CM | POA: Diagnosis not present

## 2018-06-23 ENCOUNTER — Other Ambulatory Visit: Payer: Self-pay

## 2018-06-23 ENCOUNTER — Encounter: Payer: Self-pay | Admitting: Student in an Organized Health Care Education/Training Program

## 2018-06-23 ENCOUNTER — Ambulatory Visit (HOSPITAL_BASED_OUTPATIENT_CLINIC_OR_DEPARTMENT_OTHER): Payer: Medicare HMO | Admitting: Student in an Organized Health Care Education/Training Program

## 2018-06-23 ENCOUNTER — Ambulatory Visit
Admission: RE | Admit: 2018-06-23 | Discharge: 2018-06-23 | Disposition: A | Discharge status: Home / Self Care | Payer: Medicare HMO | Source: Ambulatory Visit | Attending: Student in an Organized Health Care Education/Training Program | Admitting: Student in an Organized Health Care Education/Training Program

## 2018-06-23 DIAGNOSIS — M47816 Spondylosis without myelopathy or radiculopathy, lumbar region: Secondary | ICD-10-CM

## 2018-06-23 MED ORDER — FENTANYL CITRATE (PF) 100 MCG/2ML IJ SOLN
INTRAMUSCULAR | Status: AC
  Filled 2018-06-23: qty 2

## 2018-06-23 MED ORDER — LIDOCAINE HCL 2 % IJ SOLN
INTRAMUSCULAR | Status: AC
  Filled 2018-06-23: qty 20

## 2018-06-23 MED ORDER — FENTANYL CITRATE (PF) 100 MCG/2ML IJ SOLN
25.0000 ug | INTRAMUSCULAR | Status: DC | PRN
  Administered 2018-06-23: 100 ug via INTRAVENOUS

## 2018-06-23 MED ORDER — DEXAMETHASONE SODIUM PHOSPHATE 10 MG/ML IJ SOLN
INTRAMUSCULAR | Status: AC
  Filled 2018-06-23: qty 2

## 2018-06-23 MED ORDER — ROPIVACAINE HCL 2 MG/ML IJ SOLN
INTRAMUSCULAR | Status: AC
  Filled 2018-06-23: qty 10

## 2018-06-23 MED ORDER — LACTATED RINGERS IV SOLN
1000.0000 mL | Freq: Once | INTRAVENOUS | Status: AC
  Administered 2018-06-23: 1000 mL via INTRAVENOUS

## 2018-06-23 MED ORDER — DEXAMETHASONE SODIUM PHOSPHATE 10 MG/ML IJ SOLN
20.0000 mg | Freq: Once | INTRAMUSCULAR | Status: AC
  Administered 2018-06-23: 20 mg

## 2018-06-23 MED ORDER — ROPIVACAINE HCL 2 MG/ML IJ SOLN
18.0000 mL | Freq: Once | INTRAMUSCULAR | Status: AC
  Administered 2018-06-23: 18 mL via PERINEURAL

## 2018-06-23 NOTE — Progress Notes (Signed)
Safety precautions to be maintained throughout the outpatient stay will include: orient to surroundings, keep bed in low position, maintain call bell within reach at all times, provide assistance with transfer out of bed and ambulation.  

## 2018-06-23 NOTE — Progress Notes (Signed)
Patient's Name: George Daniels  MRN: [id number]  Referring Provider: Gillis Santa, MD  DOB: 1945-07-14  PCP: Kirk Ruths, MD  DOS: 06/23/2018  Note by: Gillis Santa, MD  Service setting: Ambulatory outpatient  Specialty: Interventional Pain Management  Patient type: Established  Location: [facility] (AMB) Pain Management Facility  Visit type: Interventional Procedure   Primary Reason for Visit: Interventional Pain Management Treatment. CC: Back Pain (low)  Procedure:          Anesthesia, Analgesia, Anxiolysis:  Type: Lumbar Facet, Medial Branch Block(s) #1  Primary Purpose: Diagnostic Region: Posterolateral Lumbosacral Spine Level: L3, L4, L5, & S1 Medial Branch Level(s). Injecting these levels blocks the L3-4, L4-5, and L5-S1 lumbar facet joints. Laterality: Bilateral   Local Anesthetic: Lidocaine 1-2%  Position: Prone   Indications: 1. Lumbar spondylosis    Pain Score: Pre-procedure: 4 /10 Post-procedure: 4 /10  Pre-op Assessment:  Mr. Badley is a 73 y.o. (year old), male patient, seen today for interventional treatment. He  has a past surgical history that includes Tonsillectomy; Achilles tendon repair (Right, 2014); Shoulder arthroscopy with open rotator cuff repair (Right, 08/02/2015); Tennis elbow release/nirschel procedure (Right, 10/02/2016); Cataract extraction, bilateral; and Excision mass neck (Right, 03/31/2018). Mr. Mitton has a current medication list which includes the following prescription(s): alprazolam, amlodipine, aspirin, atorvastatin, citalopram, clotrimazole, gabapentin, hydrocortisone cream, lisinopril, lisinopril-hydrochlorothiazide, omeprazole, prednisone, ropinirole, sildenafil, tizanidine, trazodone, doxycycline, ketoconazole, metformin, and methotrexate, and the following Facility-Administered Medications: fentanyl. His primarily concern today is the Back Pain (low)  Initial Vital Signs:  Pulse/HCG Rate: 70  Temp: 98.1 F (36.7 C) Resp: 16 BP: (!)  147/68 SpO2: 98 %  BMI: Estimated body mass index is 33.23 kg/m as calculated from the following:   Height as of this encounter: 6' (1.829 m).   Weight as of this encounter: 245 lb (111.1 kg).  Risk Assessment: Allergies: Reviewed. He is allergic to penicillins.  Allergy Precautions: None required Coagulopathies: Reviewed. None identified.  Blood-thinner therapy: None at this time Active Infection(s): Reviewed. None identified. Mr. Sappenfield is afebrile  Site Confirmation: Mr. Soter was asked to confirm the procedure and laterality before marking the site Procedure checklist: Completed Consent: Before the procedure and under the influence of no sedative(s), amnesic(s), or anxiolytics, the patient was informed of the treatment options, risks and possible complications. To fulfill our ethical and legal obligations, as recommended by the American Medical Association's Code of Ethics, I have informed the patient of my clinical impression; the nature and purpose of the treatment or procedure; the risks, benefits, and possible complications of the intervention; the alternatives, including doing nothing; the risk(s) and benefit(s) of the alternative treatment(s) or procedure(s); and the risk(s) and benefit(s) of doing nothing. The patient was provided information about the general risks and possible complications associated with the procedure. These may include, but are not limited to: failure to achieve desired goals, infection, bleeding, organ or nerve damage, allergic reactions, paralysis, and death. In addition, the patient was informed of those risks and complications associated to Spine-related procedures, such as failure to decrease pain; infection (i.e.: Meningitis, epidural or intraspinal abscess); bleeding (i.e.: epidural hematoma, subarachnoid hemorrhage, or any other type of intraspinal or peri-dural bleeding); organ or nerve damage (i.e.: Any type of peripheral nerve, nerve root, or spinal  cord injury) with subsequent damage to sensory, motor, and/or autonomic systems, resulting in permanent pain, numbness, and/or weakness of one or several areas of the body; allergic reactions; (i.e.: anaphylactic reaction); and/or death. Furthermore, the patient was informed  of those risks and complications associated with the medications. These include, but are not limited to: allergic reactions (i.e.: anaphylactic or anaphylactoid reaction(s)); adrenal axis suppression; blood sugar elevation that in diabetics may result in ketoacidosis or comma; water retention that in patients with history of congestive heart failure may result in shortness of breath, pulmonary edema, and decompensation with resultant heart failure; weight gain; swelling or edema; medication-induced neural toxicity; particulate matter embolism and blood vessel occlusion with resultant organ, and/or nervous system infarction; and/or aseptic necrosis of one or more joints. Finally, the patient was informed that Medicine is not an exact science; therefore, there is also the possibility of unforeseen or unpredictable risks and/or possible complications that may result in a catastrophic outcome. The patient indicated having understood very clearly. We have given the patient no guarantees and we have made no promises. Enough time was given to the patient to ask questions, all of which were answered to the patient's satisfaction. Mr. Gladwin has indicated that he wanted to continue with the procedure. Attestation: I, the ordering provider, attest that I have discussed with the patient the benefits, risks, side-effects, alternatives, likelihood of achieving goals, and potential problems during recovery for the procedure that I have provided informed consent. Date   Time: 06/23/2018 10:59 AM  Pre-Procedure Preparation:  Monitoring: As per clinic protocol. Respiration, ETCO2, SpO2, BP, heart rate and rhythm monitor placed and checked for adequate  function Safety Precautions: Patient was assessed for positional comfort and pressure points before starting the procedure. Time-out: I initiated and conducted the "Time-out" before starting the procedure, as per protocol. The patient was asked to participate by confirming the accuracy of the "Time Out" information. Verification of the correct person, site, and procedure were performed and confirmed by me, the nursing staff, and the patient. "Time-out" conducted as per Joint Commission's Universal Protocol (UP.01.01.01). Time: 1148  Description of Procedure:          Laterality: Bilateral. The procedure was performed in identical fashion on both sides. Levels:  L3, L4, L5, & S1 Medial Branch Level(s) Area Prepped: Posterior Lumbosacral Region Prepping solution: ChloraPrep (2% chlorhexidine gluconate and 70% isopropyl alcohol) Safety Precautions: Aspiration looking for blood return was conducted prior to all injections. At no point did we inject any substances, as a needle was being advanced. Before injecting, the patient was told to immediately notify me if he was experiencing any new onset of "ringing in the ears, or metallic taste in the mouth". No attempts were made at seeking any paresthesias. Safe injection practices and needle disposal techniques used. Medications properly checked for expiration dates. SDV (single dose vial) medications used. After the completion of the procedure, all disposable equipment used was discarded in the proper designated medical waste containers. Local Anesthesia: Protocol guidelines were followed. The patient was positioned over the fluoroscopy table. The area was prepped in the usual manner. The time-out was completed. The target area was identified using fluoroscopy. A 12-in long, straight, sterile hemostat was used with fluoroscopic guidance to locate the targets for each level blocked. Once located, the skin was marked with an approved surgical skin marker. Once all  sites were marked, the skin (epidermis, dermis, and hypodermis), as well as deeper tissues (fat, connective tissue and muscle) were infiltrated with a small amount of a short-acting local anesthetic, loaded on a 10cc syringe with a 25G, 1.5-in  Needle. An appropriate amount of time was allowed for local anesthetics to take effect before proceeding to the next step. Local  Anesthetic: Lidocaine 2.0% The unused portion of the local anesthetic was discarded in the proper designated containers. Technical explanation of process:   L3 Medial Branch Nerve Block (MBB): The target area for the L3 medial branch is at the junction of the postero-lateral aspect of the superior articular process and the superior, posterior, and medial edge of the transverse process of L4. Under fluoroscopic guidance, a Quincke needle was inserted until contact was made with os over the superior postero-lateral aspect of the pedicular shadow (target area). After negative aspiration for blood, 1 mL of the nerve block solution was injected without difficulty or complication. The needle was removed intact. L4 Medial Branch Nerve Block (MBB): The target area for the L4 medial branch is at the junction of the postero-lateral aspect of the superior articular process and the superior, posterior, and medial edge of the transverse process of L5. Under fluoroscopic guidance, a Quincke needle was inserted until contact was made with os over the superior postero-lateral aspect of the pedicular shadow (target area). After negative aspiration for blood, 17mL of the nerve block solution was injected without difficulty or complication. The needle was removed intact. L5 Medial Branch Nerve Block (MBB): The target area for the L5 medial branch is at the junction of the postero-lateral aspect of the superior articular process and the superior, posterior, and medial edge of the sacral ala. Under fluoroscopic guidance, a Quincke needle was inserted until contact  was made with os over the superior postero-lateral aspect of the pedicular shadow (target area). After negative aspiration for blood,1 mL of the nerve block solution was injected without difficulty or complication. The needle was removed intact. S1 Medial Branch Nerve Block (MBB): The target area for the S1 medial branch is at the posterior and inferior 6 o'clock position of the L5-S1 facet joint. Under fluoroscopic guidance, the Quincke needle inserted for the L5 MBB was redirected until contact was made with os over the inferior and postero aspect of the sacrum, at the 6 o' clock position under the L5-S1 facet joint (Target area). After negative aspiration for blood, 30mL of the nerve block solution was injected without difficulty or complication. The needle was removed intact.  Nerve block solution: 8 cc solution made of 6 cc of 0.2% ropivacaine, 2 cc of Decadron 10 mg/cc.  1 cc injected each level above bilaterally.  The unused portion of the solution was discarded in the proper designated containers. Procedural Needles: 22-gauge, 3.5-inch, Quincke needles used for all levels. Total steroid dose: 20 mg Decadron  Once the entire procedure was completed, the treated area was cleaned, making sure to leave some of the prepping solution back to take advantage of its long term bactericidal properties.   Illustration of the posterior view of the lumbar spine and the posterior neural structures. Laminae of L2 through S1 are labeled. DPRL5, dorsal primary ramus of L5; DPRS1, dorsal primary ramus of S1; DPR3, dorsal primary ramus of L3; FJ, facet (zygapophyseal) joint L3-L4; I, inferior articular process of L4; LB1, lateral branch of dorsal primary ramus of L1; IAB, inferior articular branches from L3 medial branch (supplies L4-L5 facet joint); IBP, intermediate branch plexus; MB3, medial branch of dorsal primary ramus of L3; NR3, third lumbar nerve root; S, superior articular process of L5; SAB, superior  articular branches from L4 (supplies L4-5 facet joint also); TP3, transverse process of L3.  Vitals:   06/23/18 1140 06/23/18 1150 06/23/18 1200 06/23/18 1210  BP: 138/73 (!) 141/77 136/81 134/82  Pulse: 68  69 68 69  Resp: 20 14 12 18   Temp:      TempSrc:      SpO2: 98% 97% 98% 99%  Weight:      Height:         Start Time: 1148 hrs. End Time: 1214 hrs.  Imaging Guidance (Spinal):          Type of Imaging Technique: Fluoroscopy Guidance (Spinal) Indication(s): Assistance in needle guidance and placement for procedures requiring needle placement in or near specific anatomical locations not easily accessible without such assistance. Exposure Time: Please see nurses notes. Contrast: None used. Fluoroscopic Guidance: I was personally present during the use of fluoroscopy. "Tunnel Vision Technique" used to obtain the best possible view of the target area. Parallax error corrected before commencing the procedure. "Direction-depth-direction" technique used to introduce the needle under continuous pulsed fluoroscopy. Once target was reached, antero-posterior, oblique, and lateral fluoroscopic projection used confirm needle placement in all planes. Images permanently stored in EMR. Interpretation: No contrast injected. I personally interpreted the imaging intraoperatively. Adequate needle placement confirmed in multiple planes. Permanent images saved into the patient's record.  Antibiotic Prophylaxis:   Anti-infectives (From admission, onward)   None     Indication(s): None identified  Post-operative Assessment:  Post-procedure Vital Signs:  Pulse/HCG Rate: 69  Temp: 98.1 F (36.7 C) Resp: 18 BP: 134/82 SpO2: 99 %  EBL: None  Complications: No immediate post-treatment complications observed by team, or reported by patient.  Note: The patient tolerated the entire procedure well. A repeat set of vitals were taken after the procedure and the patient was kept under observation  following institutional policy, for this type of procedure. Post-procedural neurological assessment was performed, showing return to baseline, prior to discharge. The patient was provided with post-procedure discharge instructions, including a section on how to identify potential problems. Should any problems arise concerning this procedure, the patient was given instructions to immediately contact us, at any time, without hesitation. In any case, we plan to contact the patient by telephone for a follow-up status report regarding this interventional procedure.  Comments:  No additional relevant information.  Plan of Care  Orders:  Orders Placed This Encounter  Procedures   DG C-Arm 1-60 Min-No Report    Intraoperative interpretation by procedural physician at [place].    Standing Status:   Standing    Number of Occurrences:   1    Order Specific Question:   Reason for exam:    Answer:   Assistance in needle guidance and placement for procedures requiring needle placement in or near specific anatomical locations not easily accessible without such assistance.   Medications ordered for procedure: Meds ordered this encounter  Medications   fentaNYL (SUBLIMAZE) injection 25-50 mcg    Make sure Narcan is available in the pyxis when using this medication. In the event of respiratory depression (RR< 8/min): Titrate NARCAN (naloxone) in increments of 0.1 to 0.2 mg IV at 2-3 minute intervals, until desired degree of reversal.   lactated ringers infusion 1,000 mL   ropivacaine (PF) 2 mg/mL (0.2%) (NAROPIN) injection 18 mL   dexamethasone (DECADRON) injection 20 mg   Medications administered: We administered fentaNYL, lactated ringers, ropivacaine (PF) 2 mg/mL (0.2%), and dexamethasone.  See the medical record for exact dosing, route, and time of administration.  Disposition: Discharge home  Discharge Date & Time: 06/23/2018; 1221 hrs.   Follow-up plan:   Return in about 4  weeks (around 07/21/2018).     Future  Appointments  Date Time Provider [place]  07/27/2018  1:45 PM Gillis Santa, MD [facility] None  08/03/2018  2:45 PM Laverle Hobby, MD [facility] None   Primary Care Physician: Kirk Ruths, MD Location: [facility] Note by: Gillis Santa, MD Date: 06/23/2018; Time: 3:08 PM  Disclaimer:  Medicine is not an exact science. The only guarantee in medicine is that nothing is guaranteed. It is important to note that the decision to proceed with this intervention was based on the information collected from the patient. The Data and conclusions were drawn from the patient's questionnaire, the interview, and the physical examination. Because the information was provided in large part by the patient, it cannot be guaranteed that it has not been purposely or unconsciously manipulated. Every effort has been made to obtain as much relevant data as possible for this evaluation. It is important to note that the conclusions that lead to this procedure are derived in large part from the available data. Always take into account that the treatment will also be dependent on availability of resources and existing treatment guidelines, considered by other Pain Management Practitioners as being common knowledge and practice, at the time of the intervention. For Medico-Legal purposes, it is also important to point out that variation in procedural techniques and pharmacological choices are the acceptable norm. The indications, contraindications, technique, and results of the above procedure should only be interpreted and judged by a Board-Certified Interventional Pain Specialist with extensive familiarity and expertise in the same exact procedure and technique.

## 2018-06-24 ENCOUNTER — Telehealth: Payer: Self-pay

## 2018-06-24 NOTE — Telephone Encounter (Signed)
Post procedure phone call. Patient states he is doing well.  

## 2018-07-15 ENCOUNTER — Encounter: Payer: Self-pay | Admitting: Student in an Organized Health Care Education/Training Program

## 2018-07-21 ENCOUNTER — Encounter: Payer: Self-pay | Admitting: Student in an Organized Health Care Education/Training Program

## 2018-07-21 ENCOUNTER — Other Ambulatory Visit: Payer: Self-pay

## 2018-07-21 ENCOUNTER — Ambulatory Visit
Payer: Medicare HMO | Attending: Student in an Organized Health Care Education/Training Program | Admitting: Student in an Organized Health Care Education/Training Program

## 2018-07-21 DIAGNOSIS — G894 Chronic pain syndrome: Secondary | ICD-10-CM

## 2018-07-21 DIAGNOSIS — M5136 Other intervertebral disc degeneration, lumbar region: Secondary | ICD-10-CM | POA: Diagnosis not present

## 2018-07-21 DIAGNOSIS — M47816 Spondylosis without myelopathy or radiculopathy, lumbar region: Secondary | ICD-10-CM

## 2018-07-21 DIAGNOSIS — M48061 Spinal stenosis, lumbar region without neurogenic claudication: Secondary | ICD-10-CM | POA: Diagnosis not present

## 2018-07-21 NOTE — Progress Notes (Signed)
Pain Management Encounter Note - Virtual Visit via Telephone Telehealth (real-time audio visits between healthcare provider and patient).  Patient's Phone No. & Preferred Pharmacy:  [phone] (home); [phone] (mobile); (Preferred) [phone]  [id number] Lorina Rabon, [facility] [place] [address] Phone: [phone] Fax: [phone]  [facility] [place], [place] [address] Phone: [phone] Fax: [phone]   Pre-screening note:  Our staff contacted Mr. Strine and offered him an "in person", "face-to-face" appointment versus a telephone encounter. He indicated preferring the telephone encounter, at this time.  Reason for Virtual Visit: COVID-19*  Social distancing based on CDC and AMA recommendations.   I contacted Alger Simons on 07/21/2018 at 12:32 PM by telephone and clearly identified myself as Gillis Santa, MD. I verified that I was speaking with the correct person using two identifiers (Name and date of birth: 07/14/1945).  Advanced Informed Consent I sought verbal advanced consent from Alger Simons for telemedicine interactions and virtual visit. I informed Mr. Koren of the security and privacy concerns, risks, and limitations associated with performing an evaluation and management service by telephone. I also informed Mr. Breton of the availability of "in person" appointments and I informed him of the possibility of a patient responsible charge related to this service. Mr. Mogel expressed understanding and agreed to proceed.   Historic Elements   Mr. BURTIS IMHOFF is a 73 y.o. year old, male patient evaluated today after his last encounter by our practice on 06/24/2018. Mr. Bogan  has a past medical history of Arthritis, Complication of anesthesia, Depression, Diabetes mellitus without complication ([place]), Family history of adverse reaction to  anesthesia, GERD (gastroesophageal reflux disease), Hypertension, Nausea, RLS (restless legs syndrome), Sarcoidosis, and Sleep apnea. He also  has a past surgical history that includes Tonsillectomy; Achilles tendon repair (Right, 2014); Shoulder arthroscopy with open rotator cuff repair (Right, 08/02/2015); Tennis elbow release/nirschel procedure (Right, 10/02/2016); Cataract extraction, bilateral; and Excision mass neck (Right, 03/31/2018). Mr. Lyerly has a current medication list which includes the following prescription(s): alprazolam, amlodipine, aspirin, atorvastatin, citalopram, clotrimazole, doxycycline, gabapentin, hydrocortisone cream, ketoconazole, lisinopril, lisinopril-hydrochlorothiazide, metformin, methotrexate, omeprazole, prednisone, ropinirole, sildenafil, tizanidine, and trazodone. He  reports that he has never smoked. He has never used smokeless tobacco. He reports current alcohol use. He reports that he does not use drugs. Mr. Couzens is allergic to penicillins.   HPI  I last saw him on 06/23/2018. He is being evaluated for a post-procedure assessment.   Procedure not effective (see below). Do not recommend RFA. Discussed lumbar spinal cord stimulation. Patient will consider. Is going to engage in PT more consistently over the next month to see if it helps.  Patient is exercising more and is finding some relief.  Post-Procedure Evaluation  Procedure: Bilateral L3, L4, L5, S1 facet medial branch nerve blocks Pre-procedure pain level:  4/10 Post-procedure: 4/10          Sedation: Please see nurses note.  Effectiveness during initial hour after procedure(Ultra-Short Term Relief):0 %  Local anesthetic used: Long-acting (4-6 hours) Effectiveness: Defined as any analgesic benefit obtained secondary to the administration of local anesthetics. This carries significant diagnostic value as to the etiological location, or anatomical origin, of the pain. Duration of benefit is expected to  coincide with the duration of the local anesthetic used.  Effectiveness during initial 4-6 hours after procedure(Short-Term Relief): 0%  Long-term benefit: Defined as any relief past the  pharmacologic duration of the local anesthetics.  Effectiveness past the initial 6 hours after procedure(Long-Term Relief): 0%  Current benefits: Defined as benefit that persist at this time.   Analgesia:  No benefit Function: No benefit ROM: No benefit   Review of recent tests  DG C-Arm 1-60 Min-No Report Fluoroscopy was utilized by the requesting physician.  No radiographic  interpretation.    Hospital Outpatient Visit on 05/26/2018  Component Date Value Ref Range Status  . Sodium 05/26/2018 135  135 - 145 mmol/L Final  . Potassium 05/26/2018 4.2  3.5 - 5.1 mmol/L Final  . Chloride 05/26/2018 100  98 - 111 mmol/L Final  . CO2 05/26/2018 28  22 - 32 mmol/L Final  . Glucose, Bld 05/26/2018 242* 70 - 99 mg/dL Final  . BUN 05/26/2018 24* 8 - 23 mg/dL Final  . Creatinine, Ser 05/26/2018 0.87  0.61 - 1.24 mg/dL Final  . Calcium 05/26/2018 9.1  8.9 - 10.3 mg/dL Final  . Total Protein 05/26/2018 6.5  6.5 - 8.1 g/dL Final  . Albumin 05/26/2018 4.1  3.5 - 5.0 g/dL Final  . AST 05/26/2018 21  15 - 41 U/L Final  . ALT 05/26/2018 28  0 - 44 U/L Final  . Alkaline Phosphatase 05/26/2018 86  38 - 126 U/L Final  . Total Bilirubin 05/26/2018 0.9  0.3 - 1.2 mg/dL Final  . GFR calc non Af Amer 05/26/2018 >60  >60 mL/min Final  . GFR calc Af Amer 05/26/2018 >60  >60 mL/min Final  . Anion gap 05/26/2018 7  5 - 15 Final   Performed at [facility], [address] [place],  [address]   Assessment  The primary encounter diagnosis was Lumbar spondylosis. Diagnoses of Lumbar facet arthropathy, Lumbar degenerative disc disease, Lumbar foraminal stenosis, and Chronic pain syndrome were also pertinent to this visit.  Plan of Care  I am having Alger Simons maintain his aspirin, amLODipine,  atorvastatin, citalopram, lisinopril-hydrochlorothiazide, gabapentin, traZODone, omeprazole, tiZANidine, rOPINIRole, metFORMIN, ALPRAZolam, hydrocortisone cream, clotrimazole, methotrexate, sildenafil, predniSONE, lisinopril, ketoconazole, and doxycycline.  Patient is s/p 2 diagnostic lumbar facet medial branch nerve blocks which were not effective for his axial low back pain related to lumbar facet arthropathy, chronic lumbar radiculopathy, neuroforaminal stenosis.  Lumbar MRI   L3-4: Left foraminal to extraforaminal encroachment by osteophyte and bulging disc material could possibly affect the left L3 nerve. This appears similar to the previous study.  L4-5: Multifactorial stenosis that could cause neural compression, more likely on the left. This appears similar to the previous study.  L5-S1: Lateral recess and foraminal stenosis left worse than right that could cause neural compression, similar to the previous study. Additionally, there is a synovial cyst projecting into the inferior left lateral recess that would have potential to affect the left S1 nerve, also unchanged.  Discussed lumbar spinal cord stimulation. Patient is interested but would like to continue with PT on his own and if pain not improved with PT, patient will call back to further discuss SCS.   Follow-up plan:   Return if symptoms worsen or fail to improve.   I discussed the assessment and treatment plan with the patient. The patient was provided an opportunity to ask questions and all were answered. The patient agreed with the plan and demonstrated an understanding of the instructions.  Patient advised to call back or seek an in-person evaluation if the symptoms or condition worsens.  Total duration of non-face-to-face encounter: 25 minutes.  Note by: Carlus Pavlov  Holley Raring, MD Date: 07/21/2018; Time: 12:32 PM  Disclaimer:  * Given the special circumstances of the COVID-19 pandemic, the federal government has announced that  the Office for Civil Rights (OCR) will exercise its enforcement discretion and will not impose penalties on physicians using telehealth in the event of noncompliance with regulatory requirements under the [place] (HIPAA) in connection with the good faith provision of telehealth during the GBMSX-11 national public health emergency. ([place])

## 2018-07-23 ENCOUNTER — Ambulatory Visit
Admission: RE | Admit: 2018-07-23 | Discharge: 2018-07-23 | Disposition: A | Discharge status: Home / Self Care | Payer: Medicare HMO | Source: Ambulatory Visit | Attending: Internal Medicine | Admitting: Internal Medicine

## 2018-07-23 ENCOUNTER — Other Ambulatory Visit: Payer: Self-pay

## 2018-07-23 DIAGNOSIS — D869 Sarcoidosis, unspecified: Secondary | ICD-10-CM | POA: Diagnosis not present

## 2018-07-23 DIAGNOSIS — R918 Other nonspecific abnormal finding of lung field: Secondary | ICD-10-CM | POA: Diagnosis not present

## 2018-07-27 ENCOUNTER — Ambulatory Visit: Payer: Medicare HMO | Admitting: Student in an Organized Health Care Education/Training Program

## 2018-08-02 NOTE — Telephone Encounter (Signed)
DR please see attached photos and advise. Thanks

## 2018-08-03 ENCOUNTER — Ambulatory Visit (INDEPENDENT_AMBULATORY_CARE_PROVIDER_SITE_OTHER): Payer: Medicare HMO | Admitting: Internal Medicine

## 2018-08-03 ENCOUNTER — Other Ambulatory Visit: Payer: Self-pay

## 2018-08-03 DIAGNOSIS — D869 Sarcoidosis, unspecified: Secondary | ICD-10-CM | POA: Diagnosis not present

## 2018-08-03 DIAGNOSIS — M4807 Spinal stenosis, lumbosacral region: Secondary | ICD-10-CM

## 2018-08-03 DIAGNOSIS — IMO0001 Reserved for inherently not codable concepts without codable children: Secondary | ICD-10-CM

## 2018-08-03 DIAGNOSIS — R911 Solitary pulmonary nodule: Secondary | ICD-10-CM | POA: Diagnosis not present

## 2018-08-03 NOTE — Progress Notes (Signed)
[facility] Consultation     Virtual Visit via Telephone Note I connected with  Pt on 08/03/18 at  2:45 PM EDT by telephone and verified that I am speaking with the correct person using two identifiers.   I discussed the limitations, risks, security and privacy concerns of performing an evaluation and management service by telephone and the availability of in person appointments. I also discussed with the patient that there may be a patient responsible charge related to this service. The patient expressed understanding and agreed to proceed. I discussed the assessment and treatment plan with the patient. The patient was provided an opportunity to ask questions and all were answered. The patient agreed with the plan and demonstrated an understanding of the instructions. Please see note below for further detail.    The patient was advised to call back or seek an in-person evaluation if the symptoms worsen or if the condition fails to improve as anticipated.  I provided 20 minutes of non-face-to-face time during this encounter.   Laverle Hobby, MD    Assessment and Plan:  Sarcoidosis, Lung nodule with paratracheal and hilar lymphadenopathy. -Continues to have scattered calcified lung nodules, mediastinal lymphadenopathy. -TB QuantiFERON negative. - Patient is off of methotrexate as per his request.  He is on prednisone now weaned down to 5 mg, at this dose he noticed increased joint pain, as well as fatigue, sleepiness.  He is gained about 15 pounds. - I recommended that he increase prednisone to 10 mg, however he is wary of the side effects and wants to stay at 5 mg.  Therefore can remain on prednisone at 5 mg daily and see how things go.  I have also discussed with him that he might benefit from going back on methotrexate, he will consider this.  He did not stop it for any particular side effect but rather he read a lot of things about it online and was having  stinky gas, which he continues to have even off of methotrexate. - Will recheck his level, metabolic panel in 3 months before next visit.  Obstructive sleep apnea - Patient had a sleep study several years ago which was positive for obstructive sleep apnea.  He was recommended CPAP but was intolerant. - Discussed that his recent weight gain may be contributing to sleep apnea, recommended sleep testing but he would like to hold off for now.  Acute kidney injury.  Resolved. - Most recent metabolic panel shows normal.  Sarcoidosis of the spine. -Continued back pain, which appears exacerbated after stopping prednisone, improved after receiving Depo steroid injection in his knee. - Patient is followed up with neurosurgery, no intervention was indicated at this time.   Orders Placed This Encounter  Procedures   Comp Met (CMET)   Angiotensin converting enzyme   Return in about 3 months (around 11/02/2018).    Date: 08/03/2018  MRN# [id number] George Daniels [birth year]   George Daniels is a 73 y.o. old male seen in consultation for chief complaint of: sarcoidosis.      HPI:  The patient is a 73 yo male with sarcoidosis. He has been having chronic back pain due to lower back spinal lesions and lung lesions. He had a biopsy of a right hip lesion and underwent a Ct guided biopsy on 04/30/17 which showed granuloma. He has had repeat Ct scan and MRI which have shown no change.  At last visit the patient had expressed the desire to be off of methotrexate  because he had read some bad things about it and it gave him smelly gas.  Patient was therefore taken off of methotrexate and placed on prednisone 20 mg daily. He continues to have gas issues. He has been weaned down to 5 mg. He finds that he is having less energy and is feeling sleepy. He has gained about 15 pounds. He had a sleep study several years ago, and was told that he needed CPAP. He did it for about a month but was intolerant. He feels  weak and tired, but denies dizziness.    Previous workup: Patient was noted to have back pain, an MRI showed progressive bone lesions, which have been present since 2013.  He subsequently had a PET scan which showed increased uptake in bone lesions suggestive of metastatic disease.  He then underwent CT guided biopsy of indeterminate lytic lesion involving the posterior aspect of the right ilium, which showed granulomatous disease suggestive of sarcoidosis.  He was asked to start methotrexate, and decreased prednisone to 10 mg daily. He is taking methotrexate now to 7.5 mg once weekly, started 2 months ago. He notes that is the same as always and has not improved on prednisone.   he had RL calf clot, he is on xarelto.   He continues have chronic back pain of 4-5/10. It has progressed from 10 years ago.  **ACE level and metabolic panel 6/50/3546>> ACE level less than 15, liver functions normal. **Liver enzymes 08/20/17>>normal.  **CT chest 09/28/17>> imaging personally reviewed and comparaed with previous on 06/29/17>>; overall CT image is unchanged, with continued presence of multiple bilateral lung nodules, right basal strandy atelectasis or scarring.  There is also mild internal calcification in the masslike opacity in the right lower lobe and mediastinal lymphadenopathy. **MRI L Spine 12/24/17>> multiple bony changes with bulging disc, with potential for-year-old compression, no significant changes from previous scan on 04/01/17.  PET scan radiologist report iMPRESSION: 1. Multiple hypermetabolic pulmonary nodules bilaterally, including a dominant spiculated lesion in the right lower lobe, likely reflecting primary bronchogenic carcinoma. 2. Associated hypermetabolic mediastinal and bilateral hilar lymph nodes consistent with nodal metastases. 3. Widespread osseous metastatic disease. 4. No definite extra osseous tumor outside of the chest.  Medication:    Current Outpatient Medications:     ALPRAZolam (XANAX) 0.5 MG tablet, Take 0.5 mg by mouth daily as needed for anxiety. , Disp: , Rfl:    amLODipine (NORVASC) 5 MG tablet, Take 5 mg by mouth daily. , Disp: , Rfl:    aspirin 81 MG tablet, Take 81 mg by mouth every morning., Disp: , Rfl:    atorvastatin (LIPITOR) 10 MG tablet, Take 10 mg by mouth every morning., Disp: , Rfl:    citalopram (CELEXA) 40 MG tablet, Take 40 mg by mouth every morning., Disp: , Rfl:    clotrimazole (LOTRIMIN) 1 % cream, Apply 1 application topically every other day., Disp: , Rfl:    doxycycline (MONODOX) 50 MG capsule, Take 50 mg by mouth daily., Disp: , Rfl:    gabapentin (NEURONTIN) 300 MG capsule, Take 300-600 mg by mouth See admin instructions. Take 300 mg by mouth in the morning and evening with dinner and take 600 mg, 332m at bedtime., Disp: , Rfl:    hydrocortisone cream 1 %, Apply 1 application topically every other day., Disp: , Rfl:    ketoconazole (NIZORAL) 2 % shampoo, Apply 1 application topically 3 (three) times a week., Disp: , Rfl:    lisinopril (PRINIVIL,ZESTRIL) 20 MG tablet,  Take 20 mg by mouth daily., Disp: , Rfl:    lisinopril-hydrochlorothiazide (PRINZIDE,ZESTORETIC) 20-25 MG tablet, Take 1 tablet by mouth every morning., Disp: , Rfl:    metFORMIN (GLUCOPHAGE-XR) 500 MG 24 hr tablet, Take 500 mg by mouth daily with supper. , Disp: , Rfl:    methotrexate (RHEUMATREX) 2.5 MG tablet, Take 5 tablets (12.5 mg total) by mouth once a week. Caution:Chemotherapy. Protect from light. (Patient not taking: Reported on 06/02/2018), Disp: 75 tablet, Rfl: 3   omeprazole (PRILOSEC) 40 MG capsule, Take 40 mg by mouth every evening., Disp: , Rfl:    predniSONE (DELTASONE) 10 MG tablet, Take 1 tablet (10 mg total) by mouth daily with breakfast., Disp: 90 tablet, Rfl: 3   rOPINIRole (REQUIP) 3 MG tablet, Take 3 mg by mouth daily after supper. , Disp: , Rfl:    sildenafil (REVATIO) 20 MG tablet, Take 20 mg by mouth daily as needed (ED).,  Disp: , Rfl:    tiZANidine (ZANAFLEX) 2 MG tablet, Take 2 mg by mouth every 8 (eight) hours as needed for muscle spasms. , Disp: , Rfl:    traZODone (DESYREL) 100 MG tablet, Take 100 mg by mouth at bedtime., Disp: , Rfl:    Allergies:  Penicillins  Review of Systems:  Constitutional: Feels well. Cardiovascular: Denies chest pain, exertional chest pain.  Pulmonary: Denies hemoptysis, pleuritic chest pain.   The remainder of systems were reviewed and were found to be negative other than what is documented in the HPI.     LABORATORY PANEL:   CBC No results for input(s): WBC, HGB, HCT, PLT in the last 168 hours. ------------------------------------------------------------------------------------------------------------------  Chemistries  No results for input(s): NA, K, CL, CO2, GLUCOSE, BUN, CREATININE, CALCIUM, MG, AST, ALT, ALKPHOS, BILITOT in the last 168 hours.  Invalid input(s): GFRCGP ------------------------------------------------------------------------------------------------------------------  Cardiac Enzymes No results for input(s): TROPONINI in the last 168 hours. ------------------------------------------------------------  RADIOLOGY:  No results found.     Thank  you for the consultation and for allowing [facility] to assist in the care of your patient. Our recommendations are noted above.  Please contact us if we can be of further service.  Marda Stalker, M.D., F.C.C.P.  Board Certified in Internal Medicine, Pulmonary Medicine, [place], and Sleep Medicine.  [facility] Office Number: [phone]   08/03/2018

## 2018-08-03 NOTE — Patient Instructions (Signed)
Watch your food intake.  Talk with your doctor about a weight loss medication.  I would recommend a sleep study at some in the future.  Continue on prednisone at 5 mg daily.

## 2018-08-04 NOTE — Telephone Encounter (Signed)
Dr please advise. thanks

## 2018-08-05 NOTE — Telephone Encounter (Signed)
DR please advise.  

## 2018-08-11 NOTE — Telephone Encounter (Signed)
DR please advise. Thanks 

## 2018-09-13 ENCOUNTER — Encounter: Payer: Medicare HMO | Attending: Internal Medicine | Admitting: Dietician

## 2018-09-13 ENCOUNTER — Encounter: Payer: Self-pay | Admitting: Dietician

## 2018-09-13 ENCOUNTER — Other Ambulatory Visit: Payer: Self-pay

## 2018-09-13 VITALS — Ht 72.0 in | Wt 258.9 lb

## 2018-09-13 DIAGNOSIS — E118 Type 2 diabetes mellitus with unspecified complications: Secondary | ICD-10-CM | POA: Insufficient documentation

## 2018-09-13 DIAGNOSIS — E119 Type 2 diabetes mellitus without complications: Secondary | ICD-10-CM

## 2018-09-13 DIAGNOSIS — Z6835 Body mass index (BMI) 35.0-35.9, adult: Secondary | ICD-10-CM | POA: Insufficient documentation

## 2018-09-13 DIAGNOSIS — E669 Obesity, unspecified: Secondary | ICD-10-CM | POA: Insufficient documentation

## 2018-09-13 DIAGNOSIS — I10 Essential (primary) hypertension: Secondary | ICD-10-CM | POA: Diagnosis not present

## 2018-09-13 DIAGNOSIS — Z713 Dietary counseling and surveillance: Secondary | ICD-10-CM | POA: Diagnosis not present

## 2018-09-13 NOTE — Progress Notes (Signed)
Medical Nutrition Therapy: Visit start time: 1330  end time: 1440 Assessment:  Diagnosis: Type 2 diabetes, obesity Past medical history: HTN, hx of depression, sarcoidosis, back pain  Psychosocial issues/ stress concerns: Rates his stress as moderate and indicates "not so well" as to how well he is dealing with stress. He scored 0 on PH-Q depression scale.  Preferred learning method:  . Visual Current weight: 258.9 lbs Height: 72 in Medications, supplements: see list  Progress and evaluation:  Patient in for initial medical nutrition therapy appointment. He reports a weight gain of 20 lbs in the past 6 months. He reports that in 2018, he was able to lose significant weight by keeping calories in 1500-2000 calorie range plus exercise. He states that presently due to chronic back pain and hamstring pain, he is not able to exercise. He is taking prednisone for Sarcoidosis which may be contributing to weight gain. He reports that he often goes to bed well after midnight and goes on a "eating binge" (both salty and sweet snacks) even after having a large dinner meal. He eats breakfast, (eggs x/week or 1/2 bagel/cream cheese or oatmeal) and rarely eats lunch. He eats Fast Foods for dinner 3 nights weekly such as BK whopper and fries with soda. Other wise, he cooks the evening meal and gives example of pasta with meat sauce, [place] toast or meatloaf, mashed potatoes and some type of vegetables.  He has made an effort to decrease fat in his diet by limiting fried foods. His beverages are 1 cup coffee, 4-12 oz soda, 1-2 (12oz) glasses lemonade, 2-3 cups of water per day. He eats 1-2 servings of fruit daily. His overall intake of fruits, vegetables and whole grains is low and he has a high intake of salty and sweet snacks.  Physical activity: none  Nutrition Care Education:  Weight control/Diabetes:  Instructed on a meal plan based on 1800 calories including carbohydrate counting, portion control  and how to better balance carbohydrate, protein, non-starchy vegetables. Encouraged to use meal plan as a guide for mindful eating verses a very restrictive meal pattern that can lead to more binging and can decrease chance of long term success. Discussed how working  toward a more consistent pattern of eating that includes 3 meals spaced 4-5 hours apart might help decrease hunger at evening meal making it easier to control portions and as well can improve blood sugar. Discussed difficulty of changing pattern of late night eating but encouraged to have one snack time and discussed options. Also, discussed possibility of sipping on water with lemon or caffeine free hot teas to help avoid excessive snacking. Also, gave and reviewed armchair exercise options including bands for strength training to try in the evenings. Gave and reviewed menu examples as well as simple meal and snack ideas. Discussed developing strategies verses relying on will power.  Nutritional Diagnosis:  NB-1.5 Disordered eating pattern As related to excessive calories from 5:00pm-1-2 am .  As evidenced by diet history . NI-1.5 Excessive energy intake As related to large portions for evening meal and excessive evening snacking..  As evidenced by diet history and gain of 20 lbs in past 6 months..  Intervention: Work to establish a consistent meal pattern of 3 meals and 1-2 small snacks.  Balance meals with 2-4 oz protein, 3-4 servings of carbohydrate and non-starchy vegetables. Include more non-starchy vegetables. Read labels for carbohydrate. Can subtract fiber. Limit added fats: mayonnaise, salad dressings, margarine Limit sugar-sweetened beverages. Education Materials given:  . Plate  Planner . Food lists/ Planning A Balanced Meal . Sample meal pattern/ menus . Quick and healthy meals . Armchair exercise handout . Web sites and phone app handout . Goals/ instructions Learner/ who was taught:  . Patient  Level of  understanding: . Partial understanding; needs review/ practice  Demonstrated degree of understanding via:   Teach back Learning barriers: . None  Willingness to learn/ readiness for change: . Acceptance, ready for change  Monitoring and Evaluation:  Dietary intake, exercise,  and body weight      follow up: 10/11/18 at 1:30pm

## 2018-09-13 NOTE — Patient Instructions (Signed)
Work to establish a consistent meal pattern of 3 meals and 1-2 small snacks.  Balance meals with 2-4 oz protein, 3-4 servings of carbohydrate and non-starchy vegetables. Include more non-starchy vegetables. Read labels for carbohydrate. Can subtract fiber. Limit added fats: mayonnaise, salad dressings, margarine Limit sugar-sweetened beverages.

## 2018-09-20 DIAGNOSIS — E118 Type 2 diabetes mellitus with unspecified complications: Secondary | ICD-10-CM | POA: Diagnosis not present

## 2018-09-20 DIAGNOSIS — F325 Major depressive disorder, single episode, in full remission: Secondary | ICD-10-CM | POA: Diagnosis not present

## 2018-09-20 DIAGNOSIS — I10 Essential (primary) hypertension: Secondary | ICD-10-CM | POA: Diagnosis not present

## 2018-10-04 DIAGNOSIS — I10 Essential (primary) hypertension: Secondary | ICD-10-CM | POA: Diagnosis not present

## 2018-10-04 DIAGNOSIS — G2581 Restless legs syndrome: Secondary | ICD-10-CM | POA: Diagnosis not present

## 2018-10-04 DIAGNOSIS — E7800 Pure hypercholesterolemia, unspecified: Secondary | ICD-10-CM | POA: Diagnosis not present

## 2018-10-04 DIAGNOSIS — E118 Type 2 diabetes mellitus with unspecified complications: Secondary | ICD-10-CM | POA: Diagnosis not present

## 2018-10-04 DIAGNOSIS — F325 Major depressive disorder, single episode, in full remission: Secondary | ICD-10-CM | POA: Diagnosis not present

## 2018-10-11 ENCOUNTER — Ambulatory Visit: Payer: Medicare HMO | Admitting: Dietician

## 2018-10-14 ENCOUNTER — Encounter: Payer: Self-pay | Admitting: Internal Medicine

## 2018-10-14 DIAGNOSIS — E119 Type 2 diabetes mellitus without complications: Secondary | ICD-10-CM | POA: Diagnosis not present

## 2018-10-14 LAB — HM DIABETES EYE EXAM

## 2018-10-26 ENCOUNTER — Encounter: Payer: Medicare HMO | Attending: Internal Medicine | Admitting: Dietician

## 2018-10-26 ENCOUNTER — Other Ambulatory Visit: Payer: Self-pay

## 2018-10-26 ENCOUNTER — Encounter: Payer: Self-pay | Admitting: Dietician

## 2018-10-26 VITALS — Ht 72.0 in | Wt 258.3 lb

## 2018-10-26 DIAGNOSIS — E118 Type 2 diabetes mellitus with unspecified complications: Secondary | ICD-10-CM | POA: Diagnosis not present

## 2018-10-26 DIAGNOSIS — Z713 Dietary counseling and surveillance: Secondary | ICD-10-CM | POA: Diagnosis not present

## 2018-10-26 DIAGNOSIS — Z6835 Body mass index (BMI) 35.0-35.9, adult: Secondary | ICD-10-CM | POA: Insufficient documentation

## 2018-10-26 DIAGNOSIS — E669 Obesity, unspecified: Secondary | ICD-10-CM | POA: Diagnosis not present

## 2018-10-26 DIAGNOSIS — I10 Essential (primary) hypertension: Secondary | ICD-10-CM | POA: Diagnosis not present

## 2018-10-26 DIAGNOSIS — E119 Type 2 diabetes mellitus without complications: Secondary | ICD-10-CM

## 2018-10-26 NOTE — Patient Instructions (Addendum)
Increase fluid intake to 48- 64 oz per day with most from water. Use an app or website to record food/beverage intake. Make a schedule for the evening starting with the dinner meal. Schedule exercise, computer time, review of food/beverage intake record, TV time, etc.  and one snack time.  Keep a calorie free beverage with you in the evenings to sip on rather than eating snacks. Continue to try to establish a meal pattern of 3 meals spaced 4-5 hours apart.

## 2018-10-26 NOTE — Progress Notes (Signed)
Medical Nutrition Therapy Follow-up visit:  Time with patient: 1500-1600 Visit #: 2 ASSESSMENT:  Diagnosis:Type 2 Diabetes  Current weight: 258.3 lbs   Height:72 in Medications: See list Medical History: HTN, hx of depression, sarcoidosis, back pain Progress and evaluation: Patient in for medical nutrition therapy follow-up. Reports he has had difficulty in establishing a meal schedule of 3 meals and 1-2 snacks. He reports that he continues to eat breakfast and then once he eats dinner often as early as 4:00, he begins snacking 1-2 hours later. He states he ate 2 eggs and toast this morning but had eaten nothing else by this appointment at 4:00pm. He states he spends 5-8 hours on his computer during the day working on spread sheets, etc related to his finances.   He continues to stay up to well after midnight and has the desire to eat but is rarely actually hungry. He watches TV shows he has recorded. He did not try armchair exercises while watching TV but has used his treadmill several times weekly. He states he will be weaned off of Prednisone hopefully by the end of July and he is hoping this will decrease his desire to eat.  NUTRITION CARE EDUCATION: Weight control/Diabetes:  Discussed difficulty of decreasing night time binge eating and how therapy can be sometimes be effective. As he states he does not have trouble falling asleep, discussed limiting TV to 2 hours and going to bed earlier. Discussed how irregular sleep pattern can have an adverse effect on weight and blood sugar. Encouraged to set up a more structured schedule in the evenings. Suggested to do more of his computer work after dinner since he rarely eats while on computer. INTERVENTION: Increase fluid intake to 48- 64 oz per day with most from water. Use an app or website to record food/beverage intake. Make a schedule for the evening starting with the dinner meal. Schedule exercise, computer time, review of  food/beverage intake record, TV time, etc.  and one snack time.  Keep a calorie free beverage with you in the evenings to sip on rather than eating snacks. Continue to try to establish a meal pattern of 3 meals spaced 4-5 hours apart.  EDUCATION MATERIALS GIVEN:  . Web sites, phone app handout . Goals/ instructions LEARNER/ who was taught:  . Patient  LEVEL OF UNDERSTANDING: . Verbalizes understanding Demonstrated degree of understanding via teach back.  LEARNING BARRIERS: . None  WILLINGNESS TO LEARN/READINESS FOR CHANGE: . Acceptance, ready for change  MONITORING AND EVALUATION:   No follow-up was scheduled at this time.

## 2018-11-02 ENCOUNTER — Ambulatory Visit: Payer: Medicare HMO | Admitting: Internal Medicine

## 2018-11-02 ENCOUNTER — Other Ambulatory Visit
Admission: RE | Admit: 2018-11-02 | Discharge: 2018-11-02 | Disposition: A | Discharge status: Home / Self Care | Payer: Medicare HMO | Source: Ambulatory Visit | Attending: Internal Medicine | Admitting: Internal Medicine

## 2018-11-02 DIAGNOSIS — D869 Sarcoidosis, unspecified: Secondary | ICD-10-CM | POA: Insufficient documentation

## 2018-11-02 LAB — COMPREHENSIVE METABOLIC PANEL
ALT: 36 U/L (ref 0–44)
AST: 23 U/L (ref 15–41)
Albumin: 4.2 g/dL (ref 3.5–5.0)
Alkaline Phosphatase: 76 U/L (ref 38–126)
Anion gap: 10 (ref 5–15)
BUN: 23 mg/dL (ref 8–23)
CO2: 27 mmol/L (ref 22–32)
Calcium: 9.3 mg/dL (ref 8.9–10.3)
Chloride: 102 mmol/L (ref 98–111)
Creatinine, Ser: 0.99 mg/dL (ref 0.61–1.24)
GFR calc Af Amer: >60 mL/min (ref >60)
GFR calc non Af Amer: >60 mL/min (ref >60)
Glucose, Bld: 145 mg/dL — ABNORMAL HIGH (ref 70–99)
Potassium: 3.8 mmol/L (ref 3.5–5.1)
Sodium: 139 mmol/L (ref 135–145)
Total Bilirubin: 1 mg/dL (ref 0.3–1.2)
Total Protein: 6.5 g/dL (ref 6.5–8.1)

## 2018-11-02 NOTE — Addendum Note (Signed)
Addended by: Santiago Bur on: 11/02/2018 08:56 AM   Modules accepted: Orders

## 2018-11-03 LAB — ANGIOTENSIN CONVERTING ENZYME: Angiotensin-Converting Enzyme: 14 U/L (ref 14–82)

## 2018-11-08 ENCOUNTER — Telehealth: Payer: Self-pay | Admitting: Internal Medicine

## 2018-11-08 NOTE — Telephone Encounter (Signed)
Called patient for COVID-19 pre-screening for in office visit. ° °Have you recently traveled any where out of the local area in the last 2 weeks? No ° °Have you been in close contact with a person diagnosed with COVID-19 or someone awaiting results within the last 2 weeks? No ° °Do you currently have any of the following symptoms? If so, when did they start? °Cough     Diarrhea   Joint Pain °Fever      Muscle Pain   Red eyes °Shortness of breath   Abdominal pain  Vomiting °Loss of smell    Rash    Sore Throat °Headache    Weakness   Bruising or bleeding ° ° °Okay to proceed with visit 11/09/2018  ° ° °

## 2018-11-09 ENCOUNTER — Encounter: Payer: Self-pay | Admitting: Internal Medicine

## 2018-11-09 ENCOUNTER — Ambulatory Visit: Payer: Medicare HMO | Admitting: Internal Medicine

## 2018-11-09 ENCOUNTER — Other Ambulatory Visit: Payer: Self-pay

## 2018-11-09 VITALS — BP 122/72 | HR 62 | Temp 98.2°F | Ht 72.0 in | Wt 258.0 lb

## 2018-11-09 DIAGNOSIS — D869 Sarcoidosis, unspecified: Secondary | ICD-10-CM | POA: Diagnosis not present

## 2018-11-09 MED ORDER — PREDNISONE 5 MG PO TABS: 5.0000 mg | ORAL_TABLET | Freq: Every day | ORAL | 3 refills | Status: DC

## 2018-11-09 NOTE — Progress Notes (Signed)
[facility] Consultation     Assessment and Plan:  Sarcoidosis, lung nodule with paratracheal and hilar lymphadenopathy. -Continues to have scattered calcified lung nodules, mediastinal lymphadenopathy. -TB QuantiFERON negative. - Sarcoidosis appears to be stable, continue methotrexate 12.5 mg weekly and prednisone at 5 mg - Will recheck his level, metabolic panel in 3 months, then again at 6 months when I see him back. --has seen ophalmology June 2020.   Obstructive sleep apnea - Patient had a sleep study several years ago which was positive for obstructive sleep apnea.  He was recommended CPAP but was intolerant. - Discussed that his recent weight gain may be contributing to sleep apnea, recommended sleep testing but he would like to hold off for now.  Acute kidney injury.  Resolved. - Most recent metabolic panel shows normal.  Sarcoidosis of the spine. - Patient is followed up with neurosurgery, no intervention was indicated at this time.   Orders Placed This Encounter  Procedures  . Comp Met (CMET)   Meds ordered this encounter  Medications  . predniSONE (DELTASONE) 5 MG tablet    Sig: Take 1 tablet (5 mg total) by mouth daily with breakfast.    Dispense:  90 tablet    Refill:  3    Return in about 6 months (around 05/12/2019).    Date: 11/09/2018  MRN# [id number] George Daniels [birth year]   George Daniels is a 73 y.o. old male seen in consultation for chief complaint of: sarcoidosis.      HPI:  George Daniels is a 73 y.o. male with a history of sarcoidosis. He was initially on prednisone and methotrexate, he was then off of methotrexate because he had read some bad things about it. He then had a flare up of sarcoidosis symptoms, and the methotrexate was restarted.   Since his last visit he feels that his breathing has been doing ok but a little short. He remains 12.5 mg methotrexate per week. He remains on prednisone 5 mg daily. He has gained  about 20 lbs since January. He had been on 20 mg daily but had not really caused weight gain during the time.   Previous workup: Patient was noted to have back pain, an MRI showed progressive bone lesions, which have been present since 2013.  He subsequently had a PET scan which showed increased uptake in bone lesions suggestive of metastatic disease.  He then underwent CT guided biopsy of indeterminate lytic lesion involving the posterior aspect of the right ilium, which showed granulomatous disease suggestive of sarcoidosis.  he had RL calf clot, he is on xarelto.   He continues have chronic back pain of 4-5/10. It has progressed from 10 years ago.  **ACE level and metabolic panel 07/21/8117>> ACE level less than 15, liver functions normal. **Liver enzymes 08/20/17>>normal.  **CT chest 09/28/17>> imaging personally reviewed and comparaed with previous on 06/29/17>>; overall CT image is unchanged, with continued presence of multiple bilateral lung nodules, right basal strandy atelectasis or scarring.  There is also mild internal calcification in the masslike opacity in the right lower lobe and mediastinal lymphadenopathy. **MRI L Spine 12/24/17>> multiple bony changes with bulging disc, with potential for-year-old compression, no significant changes from previous scan on 04/01/17.  PET scan radiologist report iMPRESSION: 1. Multiple hypermetabolic pulmonary nodules bilaterally, including a dominant spiculated lesion in the right lower lobe, likely reflecting primary bronchogenic carcinoma. 2. Associated hypermetabolic mediastinal and bilateral hilar lymph nodes consistent with nodal metastases. 3. Widespread osseous  metastatic disease. 4. No definite extra osseous tumor outside of the chest.  Medication:    Current Outpatient Medications:  .  ALPRAZolam (XANAX) 0.5 MG tablet, Take 0.5 mg by mouth daily as needed for anxiety. , Disp: , Rfl:  .  amLODipine (NORVASC) 5 MG tablet, Take 5 mg by mouth  daily. , Disp: , Rfl:  .  aspirin 81 MG tablet, Take 81 mg by mouth every morning., Disp: , Rfl:  .  atorvastatin (LIPITOR) 10 MG tablet, Take 10 mg by mouth every morning., Disp: , Rfl:  .  citalopram (CELEXA) 40 MG tablet, Take 40 mg by mouth every morning., Disp: , Rfl:  .  clotrimazole (LOTRIMIN) 1 % cream, Apply 1 application topically every other day., Disp: , Rfl:  .  doxycycline (MONODOX) 50 MG capsule, Take 50 mg by mouth daily., Disp: , Rfl:  .  gabapentin (NEURONTIN) 300 MG capsule, Take 300-600 mg by mouth See admin instructions. Take 300 mg by mouth in the morning and evening with dinner and take 600 mg, '300mg'$  at bedtime., Disp: , Rfl:  .  hydrocortisone cream 1 %, Apply 1 application topically every other day., Disp: , Rfl:  .  ketoconazole (NIZORAL) 2 % shampoo, Apply 1 application topically 3 (three) times a week., Disp: , Rfl:  .  lisinopril (PRINIVIL,ZESTRIL) 20 MG tablet, Take 20 mg by mouth daily., Disp: , Rfl:  .  lisinopril-hydrochlorothiazide (PRINZIDE,ZESTORETIC) 20-25 MG tablet, Take 1 tablet by mouth every morning., Disp: , Rfl:  .  metFORMIN (GLUCOPHAGE-XR) 500 MG 24 hr tablet, Take 500 mg by mouth daily with supper. , Disp: , Rfl:  .  methotrexate (RHEUMATREX) 2.5 MG tablet, Take 5 tablets (12.5 mg total) by mouth once a week. Caution:Chemotherapy. Protect from light., Disp: 75 tablet, Rfl: 3 .  omeprazole (PRILOSEC) 40 MG capsule, Take 40 mg by mouth every evening., Disp: , Rfl:  .  PRAMIPEXOLE DIHYDROCHLORIDE ER PO, Take 0.5 mg by mouth 3 (three) times daily., Disp: , Rfl:  .  predniSONE (DELTASONE) 10 MG tablet, Take 1 tablet (10 mg total) by mouth daily with breakfast., Disp: 90 tablet, Rfl: 3 .  rOPINIRole (REQUIP) 3 MG tablet, Take 3 mg by mouth daily after supper. , Disp: , Rfl:  .  sildenafil (REVATIO) 20 MG tablet, Take 20 mg by mouth daily as needed (ED)., Disp: , Rfl:  .  tiZANidine (ZANAFLEX) 2 MG tablet, Take 2 mg by mouth every 8 (eight) hours as needed for  muscle spasms. , Disp: , Rfl:  .  traZODone (DESYREL) 100 MG tablet, Take 100 mg by mouth at bedtime., Disp: , Rfl:    Allergies:  Penicillins  Review of Systems:  Constitutional: Feels well. Cardiovascular: Denies chest pain, exertional chest pain.  Pulmonary: Denies hemoptysis, pleuritic chest pain.   The remainder of systems were reviewed and were found to be negative other than what is documented in the HPI.   Physical Examination:   VS: BP 122/72 (BP Location: Left Arm, Cuff Size: Normal)   Pulse 62   Temp 98.2 F (36.8 C) (Temporal)   Ht 6' (1.829 m)   Wt 258 lb (117 kg)   SpO2 97%   BMI 34.99 kg/m   General Appearance: No distress  Neuro:without focal findings, mental status, speech normal, alert and oriented HEENT: PERRLA, EOM intact Pulmonary: No wheezing, No rales  CardiovascularNormal S1,S2.  No m/r/g.  Abdomen: Benign, Soft, non-tender, No masses Renal:  No costovertebral tenderness  GU:  No performed at this time. Endoc: No evident thyromegaly, no signs of acromegaly or Cushing features Skin:   warm, no rashes, no ecchymosis  Extremities: normal, no cyanosis, clubbing.    LABORATORY PANEL:   CMP Latest Ref Rng & Units 11/02/2018 05/26/2018 04/15/2018  Glucose 70 - 99 mg/dL 145(H) 242(H) 140(H)  BUN 8 - 23 mg/dL 23 24(H) 30(H)  Creatinine 0.61 - 1.24 mg/dL 0.99 0.87 1.23  Sodium 135 - 145 mmol/L 139 135 136  Potassium 3.5 - 5.1 mmol/L 3.8 4.2 4.4  Chloride 98 - 111 mmol/L 102 100 98  CO2 22 - 32 mmol/L '27 28 28  '$ Calcium 8.9 - 10.3 mg/dL 9.3 9.1 9.5  Total Protein 6.5 - 8.1 g/dL 6.5 6.5 6.9  Total Bilirubin 0.3 - 1.2 mg/dL 1.0 0.9 1.3(H)  Alkaline Phos 38 - 126 U/L 76 86 84  AST 15 - 41 U/L '23 21 22  '$ ALT 0 - 44 U/L 36 28 37   - -----------------------------------------------------------  RADIOLOGY:  No results found.     Thank  you for the consultation and for allowing [facility] to assist in the care of your patient.  Our recommendations are noted above.  Please contact us if we can be of further service.  Marda Stalker, M.D., F.C.C.P.  Board Certified in Internal Medicine, Pulmonary Medicine, [place], and Sleep Medicine.  [facility] Office Number: [phone]   11/09/2018

## 2018-11-09 NOTE — Patient Instructions (Addendum)
Recommend that you continue prednisone 5 mg daily and methotrexate 12.5 mg weekly.  Will repeat blood work in 3 months.  Then I will see you in 6 months and order another set at that time.

## 2018-11-10 MED ORDER — PREDNISONE 5 MG PO TABS: 5.0000 mg | ORAL_TABLET | Freq: Every day | ORAL | 3 refills | Status: AC

## 2018-11-12 DIAGNOSIS — M79601 Pain in right arm: Secondary | ICD-10-CM | POA: Diagnosis not present

## 2018-11-12 DIAGNOSIS — M25561 Pain in right knee: Secondary | ICD-10-CM | POA: Diagnosis not present

## 2018-11-12 DIAGNOSIS — G8929 Other chronic pain: Secondary | ICD-10-CM | POA: Diagnosis not present

## 2018-11-12 DIAGNOSIS — M25461 Effusion, right knee: Secondary | ICD-10-CM | POA: Diagnosis not present

## 2018-11-12 DIAGNOSIS — M79602 Pain in left arm: Secondary | ICD-10-CM | POA: Diagnosis not present

## 2018-11-12 DIAGNOSIS — M1711 Unilateral primary osteoarthritis, right knee: Secondary | ICD-10-CM | POA: Diagnosis not present

## 2018-11-16 ENCOUNTER — Other Ambulatory Visit: Payer: Self-pay | Admitting: Sports Medicine

## 2018-11-16 DIAGNOSIS — M25561 Pain in right knee: Secondary | ICD-10-CM

## 2018-11-16 DIAGNOSIS — M25461 Effusion, right knee: Secondary | ICD-10-CM

## 2018-11-16 DIAGNOSIS — M1711 Unilateral primary osteoarthritis, right knee: Secondary | ICD-10-CM

## 2018-11-16 DIAGNOSIS — G8929 Other chronic pain: Secondary | ICD-10-CM

## 2018-11-22 ENCOUNTER — Encounter (HOSPITAL_COMMUNITY): Payer: Self-pay

## 2018-11-22 ENCOUNTER — Ambulatory Visit (HOSPITAL_COMMUNITY): Payer: Medicare HMO

## 2018-11-23 ENCOUNTER — Ambulatory Visit
Admission: RE | Admit: 2018-11-23 | Discharge: 2018-11-23 | Disposition: A | Discharge status: Home / Self Care | Payer: Medicare HMO | Source: Ambulatory Visit | Attending: Sports Medicine | Admitting: Sports Medicine

## 2018-11-23 ENCOUNTER — Other Ambulatory Visit: Payer: Self-pay

## 2018-11-23 DIAGNOSIS — M25561 Pain in right knee: Secondary | ICD-10-CM | POA: Diagnosis not present

## 2018-11-23 DIAGNOSIS — M1711 Unilateral primary osteoarthritis, right knee: Secondary | ICD-10-CM | POA: Diagnosis not present

## 2018-11-23 DIAGNOSIS — G8929 Other chronic pain: Secondary | ICD-10-CM | POA: Diagnosis not present

## 2018-11-23 DIAGNOSIS — M25461 Effusion, right knee: Secondary | ICD-10-CM | POA: Insufficient documentation

## 2018-11-26 ENCOUNTER — Other Ambulatory Visit
Admission: RE | Admit: 2018-11-26 | Discharge: 2018-11-26 | Disposition: A | Discharge status: Home / Self Care | Payer: Medicare HMO | Source: Ambulatory Visit | Attending: Internal Medicine | Admitting: Internal Medicine

## 2018-11-26 DIAGNOSIS — M79605 Pain in left leg: Secondary | ICD-10-CM | POA: Diagnosis not present

## 2018-11-26 DIAGNOSIS — R600 Localized edema: Secondary | ICD-10-CM | POA: Diagnosis not present

## 2018-11-26 DIAGNOSIS — M7989 Other specified soft tissue disorders: Secondary | ICD-10-CM | POA: Diagnosis not present

## 2018-11-26 DIAGNOSIS — M7732 Calcaneal spur, left foot: Secondary | ICD-10-CM | POA: Diagnosis not present

## 2018-11-26 LAB — FIBRIN DERIVATIVES D-DIMER (ARMC ONLY): Fibrin derivatives D-dimer (ARMC): 1727.63 ng/mL (FEU) — ABNORMAL HIGH (ref 0.00–499.00)

## 2018-11-29 ENCOUNTER — Ambulatory Visit
Admission: RE | Admit: 2018-11-29 | Discharge: 2018-11-29 | Disposition: A | Discharge status: Home / Self Care | Payer: Medicare HMO | Source: Ambulatory Visit | Attending: Internal Medicine | Admitting: Internal Medicine

## 2018-11-29 ENCOUNTER — Other Ambulatory Visit: Payer: Self-pay

## 2018-11-29 ENCOUNTER — Other Ambulatory Visit: Payer: Self-pay | Admitting: Internal Medicine

## 2018-11-29 ENCOUNTER — Ambulatory Visit: Payer: Medicare HMO

## 2018-11-29 DIAGNOSIS — M79605 Pain in left leg: Secondary | ICD-10-CM | POA: Insufficient documentation

## 2018-11-29 DIAGNOSIS — Z86718 Personal history of other venous thrombosis and embolism: Secondary | ICD-10-CM

## 2018-11-29 DIAGNOSIS — R600 Localized edema: Secondary | ICD-10-CM | POA: Diagnosis not present

## 2018-11-29 MED ORDER — METHOTREXATE 2.5 MG PO TABS: 12.5000 mg | ORAL_TABLET | ORAL | 1 refills | Status: DC

## 2018-11-29 NOTE — Telephone Encounter (Signed)
Ok to submit script for methotrexate 12.5 mg once per week for 90 days.

## 2018-12-03 DIAGNOSIS — S56912A Strain of unspecified muscles, fascia and tendons at forearm level, left arm, initial encounter: Secondary | ICD-10-CM | POA: Diagnosis not present

## 2018-12-03 DIAGNOSIS — M7672 Peroneal tendinitis, left leg: Secondary | ICD-10-CM | POA: Diagnosis not present

## 2018-12-03 DIAGNOSIS — M1711 Unilateral primary osteoarthritis, right knee: Secondary | ICD-10-CM | POA: Diagnosis not present

## 2018-12-03 DIAGNOSIS — S83231D Complex tear of medial meniscus, current injury, right knee, subsequent encounter: Secondary | ICD-10-CM | POA: Diagnosis not present

## 2018-12-03 NOTE — Telephone Encounter (Signed)
DR please advise.  

## 2018-12-07 ENCOUNTER — Other Ambulatory Visit: Payer: Self-pay

## 2018-12-07 ENCOUNTER — Encounter
Admission: RE | Admit: 2018-12-07 | Discharge: 2018-12-07 | Disposition: A | Discharge status: Home / Self Care | Payer: Medicare HMO | Source: Ambulatory Visit | Attending: Surgery | Admitting: Surgery

## 2018-12-07 DIAGNOSIS — I10 Essential (primary) hypertension: Secondary | ICD-10-CM | POA: Insufficient documentation

## 2018-12-07 DIAGNOSIS — I4510 Unspecified right bundle-branch block: Secondary | ICD-10-CM | POA: Diagnosis not present

## 2018-12-07 DIAGNOSIS — Z20828 Contact with and (suspected) exposure to other viral communicable diseases: Secondary | ICD-10-CM | POA: Diagnosis not present

## 2018-12-07 DIAGNOSIS — M94261 Chondromalacia, right knee: Secondary | ICD-10-CM | POA: Diagnosis not present

## 2018-12-07 DIAGNOSIS — Z79899 Other long term (current) drug therapy: Secondary | ICD-10-CM | POA: Diagnosis not present

## 2018-12-07 DIAGNOSIS — Z01818 Encounter for other preprocedural examination: Secondary | ICD-10-CM | POA: Insufficient documentation

## 2018-12-07 DIAGNOSIS — Z8059 Family history of malignant neoplasm of other urinary tract organ: Secondary | ICD-10-CM | POA: Diagnosis not present

## 2018-12-07 DIAGNOSIS — S83241A Other tear of medial meniscus, current injury, right knee, initial encounter: Secondary | ICD-10-CM | POA: Diagnosis present

## 2018-12-07 DIAGNOSIS — Z86718 Personal history of other venous thrombosis and embolism: Secondary | ICD-10-CM | POA: Diagnosis not present

## 2018-12-07 DIAGNOSIS — Z9841 Cataract extraction status, right eye: Secondary | ICD-10-CM | POA: Diagnosis not present

## 2018-12-07 DIAGNOSIS — E119 Type 2 diabetes mellitus without complications: Secondary | ICD-10-CM | POA: Insufficient documentation

## 2018-12-07 DIAGNOSIS — K219 Gastro-esophageal reflux disease without esophagitis: Secondary | ICD-10-CM | POA: Diagnosis not present

## 2018-12-07 DIAGNOSIS — Z8249 Family history of ischemic heart disease and other diseases of the circulatory system: Secondary | ICD-10-CM | POA: Diagnosis not present

## 2018-12-07 DIAGNOSIS — M1990 Unspecified osteoarthritis, unspecified site: Secondary | ICD-10-CM | POA: Diagnosis not present

## 2018-12-07 DIAGNOSIS — G2581 Restless legs syndrome: Secondary | ICD-10-CM | POA: Diagnosis not present

## 2018-12-07 DIAGNOSIS — M1711 Unilateral primary osteoarthritis, right knee: Secondary | ICD-10-CM | POA: Diagnosis not present

## 2018-12-07 DIAGNOSIS — Z9842 Cataract extraction status, left eye: Secondary | ICD-10-CM | POA: Diagnosis not present

## 2018-12-07 DIAGNOSIS — F329 Major depressive disorder, single episode, unspecified: Secondary | ICD-10-CM | POA: Diagnosis not present

## 2018-12-07 DIAGNOSIS — Z808 Family history of malignant neoplasm of other organs or systems: Secondary | ICD-10-CM | POA: Diagnosis not present

## 2018-12-07 DIAGNOSIS — Z7984 Long term (current) use of oral hypoglycemic drugs: Secondary | ICD-10-CM | POA: Diagnosis not present

## 2018-12-07 DIAGNOSIS — Z7982 Long term (current) use of aspirin: Secondary | ICD-10-CM | POA: Diagnosis not present

## 2018-12-07 DIAGNOSIS — Z8371 Family history of colonic polyps: Secondary | ICD-10-CM | POA: Diagnosis not present

## 2018-12-07 DIAGNOSIS — Z1159 Encounter for screening for other viral diseases: Secondary | ICD-10-CM | POA: Insufficient documentation

## 2018-12-07 DIAGNOSIS — M23231 Derangement of other medial meniscus due to old tear or injury, right knee: Secondary | ICD-10-CM | POA: Diagnosis not present

## 2018-12-07 DIAGNOSIS — Z880 Allergy status to penicillin: Secondary | ICD-10-CM | POA: Diagnosis not present

## 2018-12-07 DIAGNOSIS — E785 Hyperlipidemia, unspecified: Secondary | ICD-10-CM | POA: Diagnosis not present

## 2018-12-07 DIAGNOSIS — M23221 Derangement of posterior horn of medial meniscus due to old tear or injury, right knee: Secondary | ICD-10-CM | POA: Diagnosis not present

## 2018-12-07 LAB — POTASSIUM: Potassium: 4.1 mmol/L (ref 3.5–5.1)

## 2018-12-07 LAB — SARS CORONAVIRUS 2 (TAT 6-24 HRS): SARS Coronavirus 2: NEGATIVE

## 2018-12-07 NOTE — Patient Instructions (Signed)
Your procedure is scheduled on: Thurs 8/27 Report to Day Surgery. To find out your arrival time please call [phone] between [place] on Wed. 8/26.  Remember: Instructions that are not followed completely may result in serious medical risk,  up to and including death, or upon the discretion of your surgeon and anesthesiologist your  surgery may need to be rescheduled.     _X__ 1. Do not eat food after midnight the night before your procedure.                 No gum chewing or hard candies. You may drink clear liquids up to 2 hours                 before you are scheduled to arrive for your surgery- DO not drink clear                 liquids within 2 hours of the start of your surgery.                 Clear Liquids include:  water, apple juice without pulp, clear carbohydrate                 drink such as Clearfast of Gatorade, Black Coffee or Tea (Do not add                 anything to coffee or tea).  __X__2.  On the morning of surgery brush your teeth with toothpaste and water, you                may rinse your mouth with mouthwash if you wish.  Do not swallow any toothpaste of mouthwash.     _X__ 3.  No Alcohol for 24 hours before or after surgery.   ___ 4.  Do Not Smoke or use e-cigarettes For 24 Hours Prior to Your Surgery.                 Do not use any chewable tobacco products for at least 6 hours prior to                 surgery.  ____  5.  Bring all medications with you on the day of surgery if instructed.   __x__  6.  Notify your doctor if there is any change in your medical condition      (cold, fever, infections).     Do not wear jewelry, make-up, hairpins, clips or nail polish. Do not wear lotions, powders, or perfumes. You may wear deodorant. Do not shave 48 hours prior to surgery. Men may shave face and neck. Do not bring valuables to the hospital.    [facility] is not responsible for any belongings or valuables.  Contacts, dentures  or bridgework may not be worn into surgery. Leave your suitcase in the car. After surgery it may be brought to your room. For patients admitted to the hospital, discharge time is determined by your treatment team.   Patients discharged the day of surgery will not be allowed to drive home.   Please read over the following fact sheets that you were given:    _x___ Take these medicines the morning of surgery with A SIP OF WATER:    1. amLODipine (NORVASC) 5 MG tablet  2. atorvastatin (LIPITOR) 10 MG tablet  3. citalopram (CELEXA) 40 MG tablet  4.gabapentin (NEURONTIN) 300 MG capsule  5.omeprazole (PRILOSEC) 40 MG capsule take dose the morning  of surgery  6.  ____ Fleet Enema (as directed)   __x__ Use CHG Soap as directed  ____ Use inhalers on the day of surgery  __x__ Stop metformin 2 days prior to surgery today    ____ Take 1/2 of usual insulin dose the night before surgery. No insulin the morning          of surgery.   __x__ Stopped aspirin yesterday  __x__ Stop Anti-inflammatories meloxicam (MOBIC) 15 MG tablet today   ____ Stop supplements until after surgery.    ____ Bring C-Pap to the hospital.   No more until after surgery methotrexate (RHEUMATREX) 2.5 MG tablet

## 2018-12-08 MED ORDER — CLINDAMYCIN PHOSPHATE 900 MG/50ML IV SOLN
900.0000 mg | Freq: Once | INTRAVENOUS | Status: AC
  Administered 2018-12-09: 900 mg via INTRAVENOUS

## 2018-12-09 ENCOUNTER — Encounter
Admission: RE | Disposition: A | Discharge status: Home / Self Care | Payer: Self-pay | Source: Ambulatory Visit | Attending: Surgery

## 2018-12-09 ENCOUNTER — Other Ambulatory Visit: Payer: Self-pay

## 2018-12-09 ENCOUNTER — Ambulatory Visit: Payer: Medicare HMO | Admitting: Anesthesiology

## 2018-12-09 ENCOUNTER — Ambulatory Visit
Admission: RE | Admit: 2018-12-09 | Discharge: 2018-12-09 | Disposition: A | Discharge status: Home / Self Care | Payer: Medicare HMO | Source: Ambulatory Visit | Attending: Surgery | Admitting: Surgery

## 2018-12-09 DIAGNOSIS — Z20828 Contact with and (suspected) exposure to other viral communicable diseases: Secondary | ICD-10-CM | POA: Insufficient documentation

## 2018-12-09 DIAGNOSIS — S83231A Complex tear of medial meniscus, current injury, right knee, initial encounter: Secondary | ICD-10-CM | POA: Diagnosis not present

## 2018-12-09 DIAGNOSIS — G4730 Sleep apnea, unspecified: Secondary | ICD-10-CM | POA: Insufficient documentation

## 2018-12-09 DIAGNOSIS — M23231 Derangement of other medial meniscus due to old tear or injury, right knee: Secondary | ICD-10-CM | POA: Diagnosis not present

## 2018-12-09 DIAGNOSIS — Z9842 Cataract extraction status, left eye: Secondary | ICD-10-CM | POA: Insufficient documentation

## 2018-12-09 DIAGNOSIS — E119 Type 2 diabetes mellitus without complications: Secondary | ICD-10-CM | POA: Insufficient documentation

## 2018-12-09 DIAGNOSIS — Z808 Family history of malignant neoplasm of other organs or systems: Secondary | ICD-10-CM | POA: Insufficient documentation

## 2018-12-09 DIAGNOSIS — M1711 Unilateral primary osteoarthritis, right knee: Secondary | ICD-10-CM | POA: Diagnosis not present

## 2018-12-09 DIAGNOSIS — I4510 Unspecified right bundle-branch block: Secondary | ICD-10-CM | POA: Insufficient documentation

## 2018-12-09 DIAGNOSIS — Z7982 Long term (current) use of aspirin: Secondary | ICD-10-CM | POA: Insufficient documentation

## 2018-12-09 DIAGNOSIS — M1990 Unspecified osteoarthritis, unspecified site: Secondary | ICD-10-CM | POA: Insufficient documentation

## 2018-12-09 DIAGNOSIS — G2581 Restless legs syndrome: Secondary | ICD-10-CM | POA: Insufficient documentation

## 2018-12-09 DIAGNOSIS — E785 Hyperlipidemia, unspecified: Secondary | ICD-10-CM | POA: Insufficient documentation

## 2018-12-09 DIAGNOSIS — F329 Major depressive disorder, single episode, unspecified: Secondary | ICD-10-CM | POA: Insufficient documentation

## 2018-12-09 DIAGNOSIS — Z8371 Family history of colonic polyps: Secondary | ICD-10-CM | POA: Insufficient documentation

## 2018-12-09 DIAGNOSIS — K219 Gastro-esophageal reflux disease without esophagitis: Secondary | ICD-10-CM | POA: Diagnosis not present

## 2018-12-09 DIAGNOSIS — S82231A Displaced oblique fracture of shaft of right tibia, initial encounter for closed fracture: Secondary | ICD-10-CM | POA: Diagnosis not present

## 2018-12-09 DIAGNOSIS — Z801 Family history of malignant neoplasm of trachea, bronchus and lung: Secondary | ICD-10-CM | POA: Insufficient documentation

## 2018-12-09 DIAGNOSIS — M94261 Chondromalacia, right knee: Secondary | ICD-10-CM | POA: Insufficient documentation

## 2018-12-09 DIAGNOSIS — Z8059 Family history of malignant neoplasm of other urinary tract organ: Secondary | ICD-10-CM | POA: Insufficient documentation

## 2018-12-09 DIAGNOSIS — Z833 Family history of diabetes mellitus: Secondary | ICD-10-CM | POA: Insufficient documentation

## 2018-12-09 DIAGNOSIS — Z9841 Cataract extraction status, right eye: Secondary | ICD-10-CM | POA: Insufficient documentation

## 2018-12-09 DIAGNOSIS — Z7984 Long term (current) use of oral hypoglycemic drugs: Secondary | ICD-10-CM | POA: Insufficient documentation

## 2018-12-09 DIAGNOSIS — M23221 Derangement of posterior horn of medial meniscus due to old tear or injury, right knee: Secondary | ICD-10-CM | POA: Diagnosis not present

## 2018-12-09 DIAGNOSIS — Z86718 Personal history of other venous thrombosis and embolism: Secondary | ICD-10-CM | POA: Insufficient documentation

## 2018-12-09 DIAGNOSIS — Z8249 Family history of ischemic heart disease and other diseases of the circulatory system: Secondary | ICD-10-CM | POA: Insufficient documentation

## 2018-12-09 DIAGNOSIS — Z79899 Other long term (current) drug therapy: Secondary | ICD-10-CM | POA: Insufficient documentation

## 2018-12-09 DIAGNOSIS — I10 Essential (primary) hypertension: Secondary | ICD-10-CM | POA: Diagnosis not present

## 2018-12-09 DIAGNOSIS — Z880 Allergy status to penicillin: Secondary | ICD-10-CM | POA: Insufficient documentation

## 2018-12-09 HISTORY — PX: KNEE ARTHROSCOPY WITH MEDIAL MENISECTOMY: SHX5651

## 2018-12-09 LAB — GLUCOSE, CAPILLARY
Glucose-Capillary: 133 mg/dL — ABNORMAL HIGH (ref 70–99)
Glucose-Capillary: 123 mg/dL — ABNORMAL HIGH (ref 70–99)

## 2018-12-09 SURGERY — ARTHROSCOPY, KNEE, WITH MEDIAL MENISCECTOMY
Anesthesia: General | Site: Knee | Laterality: Right

## 2018-12-09 MED ORDER — PROPOFOL 10 MG/ML IV BOLUS
INTRAVENOUS | Status: DC | PRN
  Administered 2018-12-09: 160 mg via INTRAVENOUS

## 2018-12-09 MED ORDER — CLINDAMYCIN PHOSPHATE 900 MG/50ML IV SOLN
INTRAVENOUS | Status: AC
  Filled 2018-12-09: qty 50

## 2018-12-09 MED ORDER — DEXAMETHASONE SODIUM PHOSPHATE 10 MG/ML IJ SOLN
INTRAMUSCULAR | Status: DC | PRN
  Administered 2018-12-09: 10 mg via INTRAVENOUS

## 2018-12-09 MED ORDER — FENTANYL CITRATE (PF) 100 MCG/2ML IJ SOLN
INTRAMUSCULAR | Status: AC
  Filled 2018-12-09: qty 2

## 2018-12-09 MED ORDER — BUPIVACAINE-EPINEPHRINE (PF) 0.5% -1:200000 IJ SOLN
INTRAMUSCULAR | Status: DC | PRN
  Administered 2018-12-09 (×2): 30 mL via PERINEURAL

## 2018-12-09 MED ORDER — DEXAMETHASONE SODIUM PHOSPHATE 10 MG/ML IJ SOLN
INTRAMUSCULAR | Status: AC
  Filled 2018-12-09: qty 1

## 2018-12-09 MED ORDER — HYDROCODONE-ACETAMINOPHEN 7.5-325 MG PO TABS
1.0000 | ORAL_TABLET | Freq: Once | ORAL | Status: DC | PRN
  Filled 2018-12-09: qty 1

## 2018-12-09 MED ORDER — FENTANYL CITRATE (PF) 100 MCG/2ML IJ SOLN: 25.0000 ug | INTRAMUSCULAR | Status: DC | PRN

## 2018-12-09 MED ORDER — LIDOCAINE HCL (CARDIAC) PF 100 MG/5ML IV SOSY
PREFILLED_SYRINGE | INTRAVENOUS | Status: DC | PRN
  Administered 2018-12-09: 100 mg via INTRAVENOUS

## 2018-12-09 MED ORDER — ROCURONIUM BROMIDE 50 MG/5ML IV SOLN
INTRAVENOUS | Status: AC
  Filled 2018-12-09: qty 1

## 2018-12-09 MED ORDER — MIDAZOLAM HCL 2 MG/2ML IJ SOLN
INTRAMUSCULAR | Status: DC | PRN
  Administered 2018-12-09: 2 mg via INTRAVENOUS

## 2018-12-09 MED ORDER — ONDANSETRON HCL 4 MG/2ML IJ SOLN
INTRAMUSCULAR | Status: DC | PRN
  Administered 2018-12-09: 4 mg via INTRAVENOUS

## 2018-12-09 MED ORDER — FENTANYL CITRATE (PF) 100 MCG/2ML IJ SOLN
INTRAMUSCULAR | Status: DC | PRN
  Administered 2018-12-09: 50 ug via INTRAVENOUS
  Administered 2018-12-09 (×2): 25 ug via INTRAVENOUS

## 2018-12-09 MED ORDER — PROPOFOL 10 MG/ML IV BOLUS
INTRAVENOUS | Status: AC
  Filled 2018-12-09: qty 20

## 2018-12-09 MED ORDER — LIDOCAINE HCL 1 % IJ SOLN
INTRAMUSCULAR | Status: DC | PRN
  Administered 2018-12-09: 20 mL

## 2018-12-09 MED ORDER — LACTATED RINGERS IV SOLN
INTRAVENOUS | Status: DC | PRN
  Administered 2018-12-09: 14:00:00 via INTRAVENOUS

## 2018-12-09 MED ORDER — ACETAMINOPHEN 160 MG/5ML PO SOLN
325.0000 mg | ORAL | Status: DC | PRN
  Filled 2018-12-09: qty 20.3

## 2018-12-09 MED ORDER — LIDOCAINE HCL (PF) 2 % IJ SOLN
INTRAMUSCULAR | Status: AC
  Filled 2018-12-09: qty 10

## 2018-12-09 MED ORDER — EPHEDRINE SULFATE 50 MG/ML IJ SOLN
INTRAMUSCULAR | Status: DC | PRN
  Administered 2018-12-09: 10 mg via INTRAVENOUS

## 2018-12-09 MED ORDER — SODIUM CHLORIDE 0.9 % IV SOLN
INTRAVENOUS | Status: DC
  Administered 2018-12-09: 11:00:00 via INTRAVENOUS

## 2018-12-09 MED ORDER — LIDOCAINE HCL (PF) 1 % IJ SOLN
INTRAMUSCULAR | Status: AC
  Filled 2018-12-09: qty 30

## 2018-12-09 MED ORDER — KETOROLAC TROMETHAMINE 30 MG/ML IJ SOLN: 30.0000 mg | Freq: Once | INTRAMUSCULAR | Status: DC | PRN

## 2018-12-09 MED ORDER — MEPERIDINE HCL 50 MG/ML IJ SOLN: 6.2500 mg | INTRAMUSCULAR | Status: DC | PRN

## 2018-12-09 MED ORDER — OXYCODONE HCL 5 MG PO TABS: 5.0000 mg | ORAL_TABLET | ORAL | 0 refills | Status: DC | PRN

## 2018-12-09 MED ORDER — ACETAMINOPHEN 325 MG PO TABS: 325.0000 mg | ORAL_TABLET | ORAL | Status: DC | PRN

## 2018-12-09 MED ORDER — ONDANSETRON HCL 4 MG/2ML IJ SOLN
INTRAMUSCULAR | Status: AC
  Filled 2018-12-09: qty 2

## 2018-12-09 MED ORDER — PROMETHAZINE HCL 25 MG/ML IJ SOLN: 6.2500 mg | INTRAMUSCULAR | Status: DC | PRN

## 2018-12-09 MED ORDER — BUPIVACAINE-EPINEPHRINE (PF) 0.5% -1:200000 IJ SOLN
INTRAMUSCULAR | Status: AC
  Filled 2018-12-09: qty 30

## 2018-12-09 SURGICAL SUPPLY — 38 items
BAG COUNTER SPONGE EZ (MISCELLANEOUS) IMPLANT
BLADE FULL RADIUS 3.5 (BLADE) ×3 IMPLANT
BLADE SHAVER 4.5X7 STR FR (MISCELLANEOUS) ×3 IMPLANT
BNDG ELASTIC 6X5.8 VLCR STR LF (GAUZE/BANDAGES/DRESSINGS) ×3 IMPLANT
CHLORAPREP W/TINT 26 (MISCELLANEOUS) ×3 IMPLANT
COUNTER SPONGE BAG EZ (MISCELLANEOUS)
COVER WAND RF STERILE (DRAPES) ×3 IMPLANT
CUFF TOURN SGL QUICK 24 (TOURNIQUET CUFF)
CUFF TOURN SGL QUICK 30 (TOURNIQUET CUFF)
CUFF TRNQT CYL 24X4X16.5-23 (TOURNIQUET CUFF) IMPLANT
CUFF TRNQT CYL 30X4X21-28X (TOURNIQUET CUFF) IMPLANT
DRAPE SPLIT 6X30 W/TAPE (DRAPES) ×3 IMPLANT
ELECT REM PT RETURN 9FT ADLT (ELECTROSURGICAL) ×3
ELECTRODE REM PT RTRN 9FT ADLT (ELECTROSURGICAL) ×1 IMPLANT
GAUZE SPONGE 4X4 12PLY STRL (GAUZE/BANDAGES/DRESSINGS) ×3 IMPLANT
GAUZE XEROFORM 1X8 LF (GAUZE/BANDAGES/DRESSINGS) ×2 IMPLANT
GLOVE BIO SURGEON STRL SZ8 (GLOVE) ×6 IMPLANT
GLOVE BIOGEL M 7.0 STRL (GLOVE) ×6 IMPLANT
GLOVE BIOGEL PI IND STRL 7.5 (GLOVE) ×1 IMPLANT
GLOVE BIOGEL PI INDICATOR 7.5 (GLOVE) ×2
GLOVE INDICATOR 8.0 STRL GRN (GLOVE) ×3 IMPLANT
GOWN STRL REUS W/ TWL LRG LVL3 (GOWN DISPOSABLE) ×1 IMPLANT
GOWN STRL REUS W/ TWL XL LVL3 (GOWN DISPOSABLE) ×2 IMPLANT
GOWN STRL REUS W/TWL LRG LVL3 (GOWN DISPOSABLE) ×2
GOWN STRL REUS W/TWL XL LVL3 (GOWN DISPOSABLE) ×4
IV LACTATED RINGER IRRG 3000ML (IV SOLUTION) ×2
IV LR IRRIG 3000ML ARTHROMATIC (IV SOLUTION) ×1 IMPLANT
KIT TURNOVER KIT A (KITS) ×3 IMPLANT
MANIFOLD NEPTUNE II (INSTRUMENTS) ×3 IMPLANT
NDL HYPO 21X1.5 SAFETY (NEEDLE) ×1 IMPLANT
NEEDLE HYPO 21X1.5 SAFETY (NEEDLE) ×3 IMPLANT
PACK ARTHROSCOPY KNEE (MISCELLANEOUS) ×3 IMPLANT
PENCIL ELECTRO HAND CTR (MISCELLANEOUS) ×3 IMPLANT
SUT PROLENE 4 0 PS 2 18 (SUTURE) ×3 IMPLANT
SUT TICRON COATED BLUE 2 0 30 (SUTURE) IMPLANT
SYR 50ML LL SCALE MARK (SYRINGE) ×3 IMPLANT
TUBING ARTHRO INFLOW-ONLY STRL (TUBING) ×3 IMPLANT
WAND WEREWOLF FLOW 90D (MISCELLANEOUS) ×3 IMPLANT

## 2018-12-09 NOTE — H&P (Signed)
Paper H&P to be scanned into permanent record. H&P reviewed and patient re-examined. No changes. 

## 2018-12-09 NOTE — Anesthesia Preprocedure Evaluation (Addendum)
Anesthesia Evaluation  Patient identified by MRN, date of birth, ID band Patient awake    Reviewed: Allergy & Precautions, H&P , NPO status , reviewed documented beta blocker date and time   History of Anesthesia Complications (+) Family history of anesthesia reaction and history of anesthetic complications  Airway Mallampati: III  TM Distance: >3 FB Neck ROM: full    Dental  (+) Caps   Pulmonary sleep apnea ,    Pulmonary exam normal        Cardiovascular hypertension, Normal cardiovascular exam  RBBB on EKG   Neuro/Psych PSYCHIATRIC DISORDERS Depression    GI/Hepatic GERD  Medicated and Controlled,  Endo/Other  diabetes  Renal/GU      Musculoskeletal  (+) Arthritis ,   Abdominal   Peds  Hematology   Anesthesia Other Findings Past Medical History: No date: Arthritis     Comment:  back No date: Complication of anesthesia     Comment:  nausea No date: Depression No date: Diabetes mellitus without complication (HCC) No date: Family history of adverse reaction to anesthesia     Comment:  daughter nausea No date: GERD (gastroesophageal reflux disease) No date: Hypertension No date: Nausea No date: RLS (restless legs syndrome) No date: Sarcoidosis No date: Sleep apnea     Comment:  does not use cpap Past Surgical History: 2014: ACHILLES TENDON REPAIR; Right No date: CATARACT EXTRACTION, BILATERAL 03/31/2018: EXCISION MASS NECK; Right     Comment:  Procedure: EXCISION MASS NECK;  Surgeon: Clyde Canterbury,               MD;  Location: [facility] ORS;  Service: ENT;  Laterality:               Right; 08/02/2015: SHOULDER ARTHROSCOPY WITH OPEN ROTATOR CUFF REPAIR; Right     Comment:  Procedure: SHOULDER ARTHROSCOPY WITH LIMITED               DEBRIDEMENT,  MINI OPEN ROTATOR CUFF REPAIR,               DECOMPRESSION;  Surgeon: Corky Mull, MD;  Location:               [facility] ORS;  Service: Orthopedics;  Laterality:  Right; 10/02/2016: TENNIS ELBOW RELEASE/NIRSCHEL PROCEDURE; Right     Comment:  Procedure: TENNIS ELBOW / OPEN DEBRIDMENT OF THE COMMON               EXTENSOR ORGIN OF RIGHT ELBOW;  Surgeon: Corky Mull,               MD;  Location: [facility] ORS;  Service: Orthopedics;                Laterality: Right; No date: TONSILLECTOMY   Reproductive/Obstetrics                            Anesthesia Physical Anesthesia Plan  ASA: III  Anesthesia Plan: General   Post-op Pain Management:    Induction: Intravenous  PONV Risk Score and Plan: Ondansetron and Treatment may vary due to age or medical condition  Airway Management Planned: LMA  Additional Equipment:   Intra-op Plan:   Post-operative Plan: Extubation in OR  Informed Consent: I have reviewed the patients History and Physical, chart, labs and discussed the procedure including the risks, benefits and alternatives for the proposed anesthesia with the patient or authorized representative who has indicated his/her understanding and acceptance.  Dental Advisory Given  Plan Discussed with: CRNA  Anesthesia Plan Comments:         Anesthesia Quick Evaluation

## 2018-12-09 NOTE — Discharge Instructions (Addendum)
Orthopedic discharge instructions: Keep dressing dry and intact.  May shower after dressing changed on post-op day #4 (Monday).  Cover sutures with Band-Aids after drying off. Apply ice frequently to knee. Take Mobic 15 mg daily OR ibuprofen 600-800 mg TID with meals for 7-10 days, then as necessary. Take pain medication as prescribed or ES Tylenol when needed.  May weight-bear as tolerated - use crutches or walker as needed. Follow-up in 10-14 days or as scheduled.    AMBULATORY SURGERY  DISCHARGE INSTRUCTIONS   1) The drugs that you were given will stay in your system until tomorrow so for the next 24 hours you should not:  A) Drive an automobile B) Make any legal decisions C) Drink any alcoholic beverage   2) You may resume regular meals tomorrow.  Today it is better to start with liquids and gradually work up to solid foods.  You may eat anything you prefer, but it is better to start with liquids, then soup and crackers, and gradually work up to solid foods.   3) Please notify your doctor immediately if you have any unusual bleeding, trouble breathing, redness and pain at the surgery site, drainage, fever, or pain not relieved by medication.    4) Additional Instructions:        Please contact your physician with any problems or Same Day Surgery at [phone], Monday through Friday 6 am to 4 pm, or [place] [facility] number at [phone].

## 2018-12-09 NOTE — Anesthesia Post-op Follow-up Note (Signed)
Anesthesia QCDR form completed.        

## 2018-12-09 NOTE — Anesthesia Procedure Notes (Signed)
Procedure Name: LMA Insertion Date/Time: 12/09/2018 1:54 PM Performed by: Philbert Riser, CRNA Pre-anesthesia Checklist: Patient identified, Emergency Drugs available, Suction available, Patient being monitored and Timeout performed Patient Re-evaluated:Patient Re-evaluated prior to induction Oxygen Delivery Method: Circle system utilized and Simple face mask Preoxygenation: Pre-oxygenation with 100% oxygen Induction Type: IV induction Ventilation: Mask ventilation without difficulty LMA: LMA inserted LMA Size: 4.5 Placement Confirmation: positive ETCO2 and breath sounds checked- equal and bilateral Dental Injury: Teeth and Oropharynx as per pre-operative assessment

## 2018-12-09 NOTE — Transfer of Care (Signed)
Immediate Anesthesia Transfer of Care Note  Patient: George Daniels  Procedure(s) Performed: KNEE ARTHROSCOPY (Right Knee)  Patient Location: PACU  Anesthesia Type:General  Level of Consciousness: awake, alert  and oriented  Airway & Oxygen Therapy: Patient Spontanous Breathing and Patient connected to face mask oxygen  Post-op Assessment: Report given to RN and Post -op Vital signs reviewed and stable  Post vital signs: Reviewed and stable  Last Vitals:  Vitals Value Taken Time  BP 111/51 12/09/18 1451  Temp    Pulse 61 12/09/18 1456  Resp 13 12/09/18 1456  SpO2 94 % 12/09/18 1456  Vitals shown include unvalidated device data.  Last Pain:  Vitals:   12/09/18 1452  TempSrc:   PainSc: (P) Asleep         Complications: No apparent anesthesia complications

## 2018-12-09 NOTE — Op Note (Signed)
12/09/2018  2:45 PM  Patient:   George Daniels  Pre-Op Diagnosis:   Complex tear of medial meniscus with underlying degenerative joint disease, right knee.  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  Surgeon:   Pascal Lux, M.D.  Anesthesia:   General LMA.  Findings:   As above.  There was a complex tear of the medial meniscus with an unstable flap component flipped into the medial gutter.  Laterally, there was minimal fraying along the central margin of the central portion of the lateral meniscus.  The anterior and posterior cruciate ligaments both were in satisfactory condition.  The remainder of the articular surfaces all were in satisfactory condition as well.  Complications:   None.  EBL:   5 cc.  Total fluids:   850 cc of crystalloid.  Tourniquet time:   None  Drains:   None  Closure:   4-0 Prolene interrupted sutures.  Brief clinical note:   The patient is a 73 year old male with a 1+ year history of worsening medial sided right knee pain. His symptoms have persisted despite medications, activity modification, injections, etc. His history examination consistent with a medial meniscus tear confirmed by MRI scan. The patient presents at this time for arthroscopy, debridement, and partial medial meniscectomy.  Procedure:   The patient was brought into the operating room and lain in the supine position. After adequate general laryngeal mask anesthesia was obtained, a timeout was performed to verify the appropriate side. The patient's right knee was injected sterilely using a solution of 30 cc of 1% lidocaine and 30 cc of 0.5% Sensorcaine with epinephrine. The right lower extremity was prepped with ChloraPrep solution before being draped sterilely. Preoperative antibiotics were administered. The expected portal sites were injected with  0.5% Sensorcaine with epinephrine before the camera was placed in the anterolateral portal and instrumentation performed through the anteromedial portal.   The knee was sequentially examined beginning in the suprapatellar pouch, then progressing to the patellofemoral space, the medial gutter and compartment, the notch, and finally the lateral compartment and gutter. The findings were as described above. Abundant reactive synovial tissues anteriorly were debrided using the full-radius resector in order to improve visualization. The complex tear of the posterior and medial portions of medial meniscus were debrided back to stable margins using combination of the straight and up-biting baskets, the side-biting basket, and the full-radius resector. Subsequent probing of the remaining rim demonstrated excellent stability. In addition, the areas of loose articular cartilage on the tibial plateau medially was debrided back to stable margins using the full-radius resector. Laterally, the meniscus was intact to probing. The mild fraying noted centrally was debrided back to stable margins using a full-radius resector.  The instruments were removed from the joint after suctioning the excess fluid.   The portal sites were closed using 4-0 Prolene interrupted sutures before a sterile bulky dressing was applied to the knee. The patient was then awakened, extubated, and returned to the recovery room in satisfactory condition after tolerating the procedure well.

## 2018-12-09 NOTE — OR Nursing (Signed)
drsg to knee clean dry & intact, ice/elevation in use

## 2018-12-10 ENCOUNTER — Encounter: Payer: Self-pay | Admitting: Surgery

## 2018-12-16 NOTE — Anesthesia Postprocedure Evaluation (Signed)
Anesthesia Post Note  Patient: ADONYS KADEN  Procedure(s) Performed: KNEE ARTHROSCOPY (Right Knee)  Patient location during evaluation: PACU Anesthesia Type: General Level of consciousness: awake and alert Pain management: pain level controlled Vital Signs Assessment: post-procedure vital signs reviewed and stable Respiratory status: spontaneous breathing, nonlabored ventilation and respiratory function stable Cardiovascular status: blood pressure returned to baseline and stable Postop Assessment: no apparent nausea or vomiting Anesthetic complications: no     Last Vitals:  Vitals:   12/09/18 1544 12/09/18 1606  BP: (!) 133/57 (!) 138/56  Pulse: 64 61  Resp: 18 18  Temp: (!) 35.9 C   SpO2: 95% 97%    Last Pain:  Vitals:   12/10/18 0802  TempSrc:   PainSc: 0-No pain                 Alphonsus Sias

## 2019-01-24 ENCOUNTER — Other Ambulatory Visit: Payer: Self-pay | Admitting: Surgery

## 2019-01-24 DIAGNOSIS — M7672 Peroneal tendinitis, left leg: Secondary | ICD-10-CM

## 2019-01-24 DIAGNOSIS — M1711 Unilateral primary osteoarthritis, right knee: Secondary | ICD-10-CM

## 2019-01-24 DIAGNOSIS — S56912D Strain of unspecified muscles, fascia and tendons at forearm level, left arm, subsequent encounter: Secondary | ICD-10-CM

## 2019-01-28 DIAGNOSIS — I10 Essential (primary) hypertension: Secondary | ICD-10-CM | POA: Diagnosis not present

## 2019-01-28 DIAGNOSIS — E118 Type 2 diabetes mellitus with unspecified complications: Secondary | ICD-10-CM | POA: Diagnosis not present

## 2019-01-28 DIAGNOSIS — R635 Abnormal weight gain: Secondary | ICD-10-CM | POA: Diagnosis not present

## 2019-01-28 DIAGNOSIS — E7800 Pure hypercholesterolemia, unspecified: Secondary | ICD-10-CM | POA: Diagnosis not present

## 2019-02-03 DIAGNOSIS — E118 Type 2 diabetes mellitus with unspecified complications: Secondary | ICD-10-CM | POA: Diagnosis not present

## 2019-02-03 DIAGNOSIS — E7800 Pure hypercholesterolemia, unspecified: Secondary | ICD-10-CM | POA: Diagnosis not present

## 2019-02-03 DIAGNOSIS — Z0000 Encounter for general adult medical examination without abnormal findings: Secondary | ICD-10-CM | POA: Diagnosis not present

## 2019-02-03 DIAGNOSIS — M79672 Pain in left foot: Secondary | ICD-10-CM | POA: Diagnosis not present

## 2019-02-03 DIAGNOSIS — F325 Major depressive disorder, single episode, in full remission: Secondary | ICD-10-CM | POA: Diagnosis not present

## 2019-02-03 DIAGNOSIS — I10 Essential (primary) hypertension: Secondary | ICD-10-CM | POA: Diagnosis not present

## 2019-02-07 ENCOUNTER — Ambulatory Visit: Payer: Medicare HMO

## 2019-02-10 DIAGNOSIS — M898X9 Other specified disorders of bone, unspecified site: Secondary | ICD-10-CM | POA: Diagnosis not present

## 2019-02-10 DIAGNOSIS — M79671 Pain in right foot: Secondary | ICD-10-CM | POA: Diagnosis not present

## 2019-02-10 DIAGNOSIS — M79672 Pain in left foot: Secondary | ICD-10-CM | POA: Diagnosis not present

## 2019-02-10 DIAGNOSIS — E119 Type 2 diabetes mellitus without complications: Secondary | ICD-10-CM | POA: Diagnosis not present

## 2019-02-10 DIAGNOSIS — M7671 Peroneal tendinitis, right leg: Secondary | ICD-10-CM | POA: Diagnosis not present

## 2019-02-10 DIAGNOSIS — M7672 Peroneal tendinitis, left leg: Secondary | ICD-10-CM | POA: Diagnosis not present

## 2019-02-14 DIAGNOSIS — L821 Other seborrheic keratosis: Secondary | ICD-10-CM | POA: Diagnosis not present

## 2019-02-14 DIAGNOSIS — L814 Other melanin hyperpigmentation: Secondary | ICD-10-CM | POA: Diagnosis not present

## 2019-02-14 DIAGNOSIS — L820 Inflamed seborrheic keratosis: Secondary | ICD-10-CM | POA: Diagnosis not present

## 2019-02-14 DIAGNOSIS — I781 Nevus, non-neoplastic: Secondary | ICD-10-CM | POA: Diagnosis not present

## 2019-02-14 DIAGNOSIS — D229 Melanocytic nevi, unspecified: Secondary | ICD-10-CM | POA: Diagnosis not present

## 2019-02-14 DIAGNOSIS — I872 Venous insufficiency (chronic) (peripheral): Secondary | ICD-10-CM | POA: Diagnosis not present

## 2019-02-14 DIAGNOSIS — D225 Melanocytic nevi of trunk: Secondary | ICD-10-CM | POA: Diagnosis not present

## 2019-02-14 DIAGNOSIS — D2230 Melanocytic nevi of unspecified part of face: Secondary | ICD-10-CM | POA: Diagnosis not present

## 2019-02-14 DIAGNOSIS — Z1283 Encounter for screening for malignant neoplasm of skin: Secondary | ICD-10-CM | POA: Diagnosis not present

## 2019-02-24 DIAGNOSIS — D860 Sarcoidosis of lung: Secondary | ICD-10-CM | POA: Diagnosis not present

## 2019-03-07 DIAGNOSIS — G2581 Restless legs syndrome: Secondary | ICD-10-CM | POA: Diagnosis not present

## 2019-03-14 DIAGNOSIS — M1711 Unilateral primary osteoarthritis, right knee: Secondary | ICD-10-CM | POA: Diagnosis not present

## 2019-03-14 DIAGNOSIS — S83231D Complex tear of medial meniscus, current injury, right knee, subsequent encounter: Secondary | ICD-10-CM | POA: Diagnosis not present

## 2019-03-17 DIAGNOSIS — I10 Essential (primary) hypertension: Secondary | ICD-10-CM | POA: Diagnosis not present

## 2019-03-17 DIAGNOSIS — E119 Type 2 diabetes mellitus without complications: Secondary | ICD-10-CM | POA: Diagnosis not present

## 2019-03-17 DIAGNOSIS — E1142 Type 2 diabetes mellitus with diabetic polyneuropathy: Secondary | ICD-10-CM | POA: Diagnosis not present

## 2019-03-17 DIAGNOSIS — E7800 Pure hypercholesterolemia, unspecified: Secondary | ICD-10-CM | POA: Diagnosis not present

## 2019-03-17 DIAGNOSIS — E669 Obesity, unspecified: Secondary | ICD-10-CM | POA: Diagnosis not present

## 2019-03-31 DIAGNOSIS — F325 Major depressive disorder, single episode, in full remission: Secondary | ICD-10-CM | POA: Diagnosis not present

## 2019-03-31 DIAGNOSIS — E118 Type 2 diabetes mellitus with unspecified complications: Secondary | ICD-10-CM | POA: Diagnosis not present

## 2019-03-31 DIAGNOSIS — M545 Low back pain: Secondary | ICD-10-CM | POA: Diagnosis not present

## 2019-03-31 DIAGNOSIS — K120 Recurrent oral aphthae: Secondary | ICD-10-CM | POA: Diagnosis not present

## 2019-03-31 DIAGNOSIS — G8929 Other chronic pain: Secondary | ICD-10-CM | POA: Diagnosis not present

## 2019-03-31 DIAGNOSIS — Z8371 Family history of colonic polyps: Secondary | ICD-10-CM | POA: Diagnosis not present

## 2019-03-31 DIAGNOSIS — D869 Sarcoidosis, unspecified: Secondary | ICD-10-CM | POA: Diagnosis not present

## 2019-04-26 DIAGNOSIS — Z01818 Encounter for other preprocedural examination: Secondary | ICD-10-CM | POA: Diagnosis not present

## 2019-04-28 DIAGNOSIS — D869 Sarcoidosis, unspecified: Secondary | ICD-10-CM | POA: Diagnosis not present

## 2019-04-28 DIAGNOSIS — D860 Sarcoidosis of lung: Secondary | ICD-10-CM | POA: Diagnosis not present

## 2019-05-31 DIAGNOSIS — D869 Sarcoidosis, unspecified: Secondary | ICD-10-CM | POA: Diagnosis not present

## 2019-06-06 DIAGNOSIS — E7800 Pure hypercholesterolemia, unspecified: Secondary | ICD-10-CM | POA: Diagnosis not present

## 2019-06-06 DIAGNOSIS — R413 Other amnesia: Secondary | ICD-10-CM | POA: Diagnosis not present

## 2019-06-06 DIAGNOSIS — G2581 Restless legs syndrome: Secondary | ICD-10-CM | POA: Diagnosis not present

## 2019-06-06 DIAGNOSIS — R252 Cramp and spasm: Secondary | ICD-10-CM | POA: Diagnosis not present

## 2019-06-06 DIAGNOSIS — E118 Type 2 diabetes mellitus with unspecified complications: Secondary | ICD-10-CM | POA: Diagnosis not present

## 2019-06-06 DIAGNOSIS — I10 Essential (primary) hypertension: Secondary | ICD-10-CM | POA: Diagnosis not present

## 2019-06-07 DIAGNOSIS — E7800 Pure hypercholesterolemia, unspecified: Secondary | ICD-10-CM | POA: Diagnosis not present

## 2019-06-07 DIAGNOSIS — E118 Type 2 diabetes mellitus with unspecified complications: Secondary | ICD-10-CM | POA: Diagnosis not present

## 2019-06-07 DIAGNOSIS — F325 Major depressive disorder, single episode, in full remission: Secondary | ICD-10-CM | POA: Diagnosis not present

## 2019-06-07 DIAGNOSIS — D869 Sarcoidosis, unspecified: Secondary | ICD-10-CM | POA: Diagnosis not present

## 2019-06-11 IMAGING — CT CT CHEST W/ CM
2 of 5 series · 12 of 36 positions shown, 15 images · IV contrast (iopamidol)
Comparison: MR lumbar spine 04/01/2017

CLINICAL DATA: Patient with suspicious osseous lesions on prior MRI
concerning for the possibility of metastatic prostate cancer.

EXAM:
CT CHEST, ABDOMEN, AND PELVIS WITH CONTRAST
TECHNIQUE: Multidetector CT imaging of the chest, abdomen and pelvis was
performed following the standard protocol during bolus
administration of intravenous contrast.
CONTRAST:  100mL X0MJN5-GDD IOPAMIDOL (X0MJN5-GDD) INJECTION 61%

[Series 2: cap with · axial · 0.74mm/px · z∈[-693,-163]mm · 9 of 134 slices shown, 12 images]
[im 14/134  mediastinal]
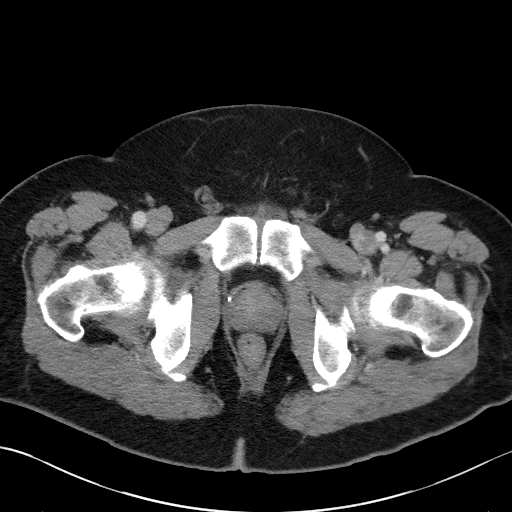
[im 14/134  lung]
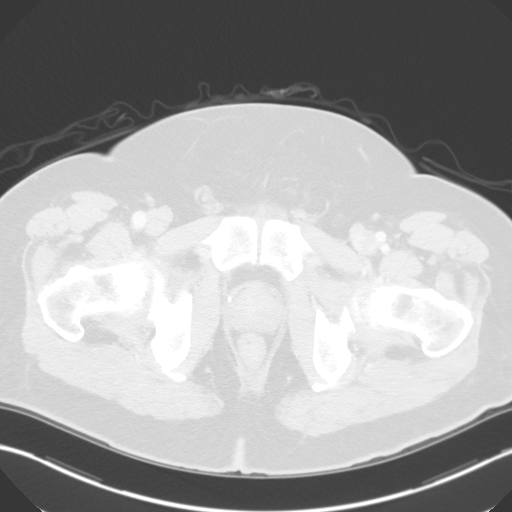
[im 27/134  lung]
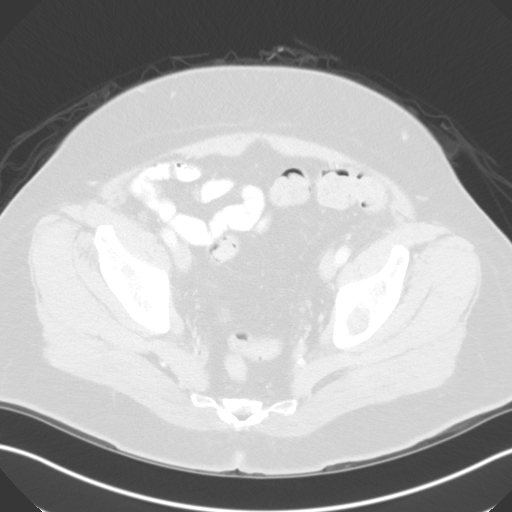
[im 40/134  lung]
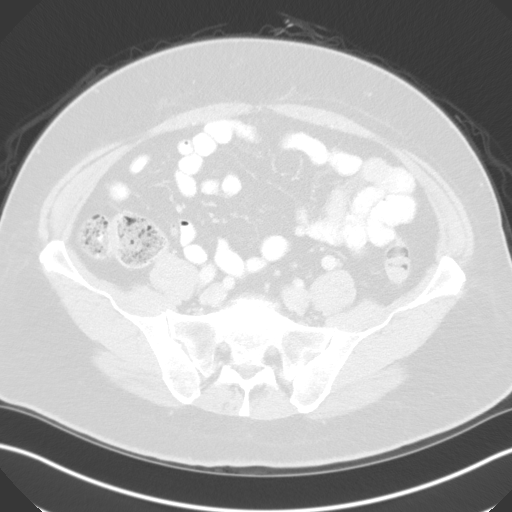
[im 54/134  lung]
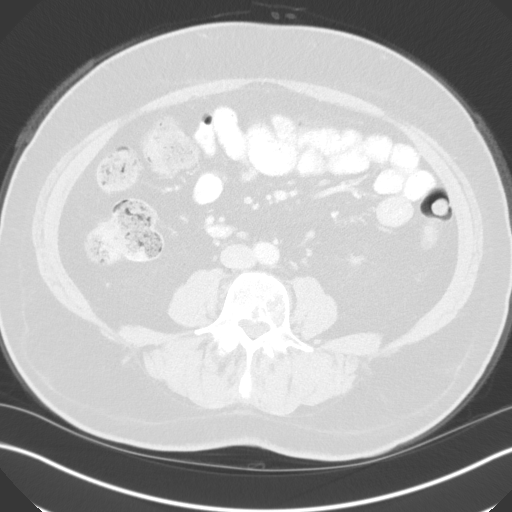
[im 67/134  mediastinal]
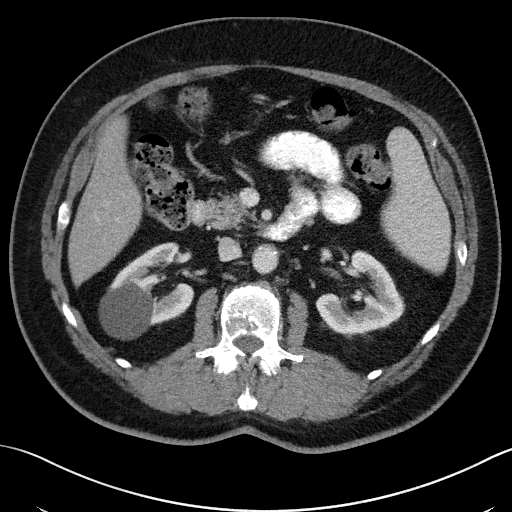
[im 67/134  lung]
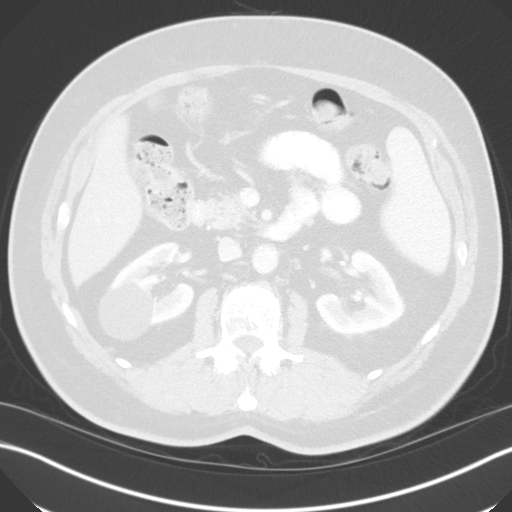
[im 80/134  lung]
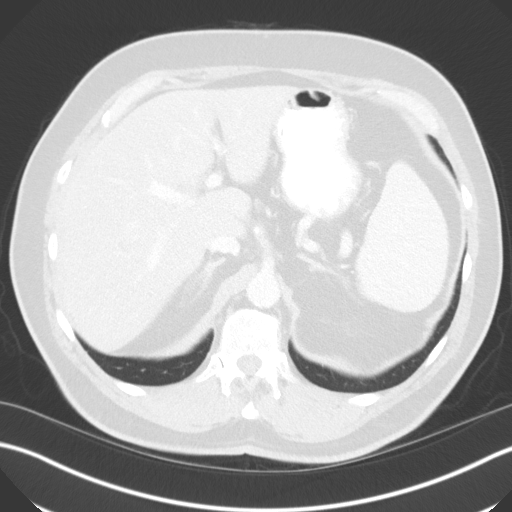
[im 94/134  lung]
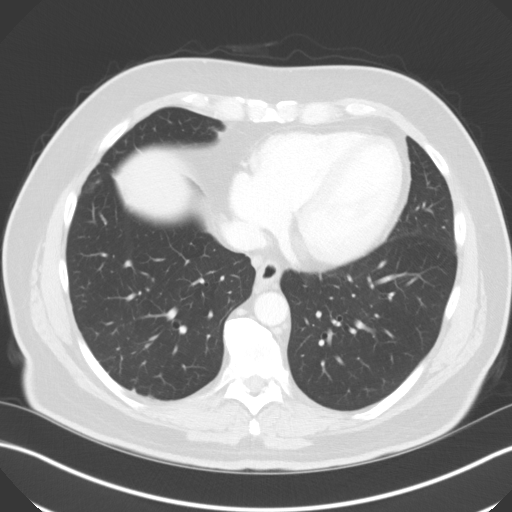
[im 107/134  lung]
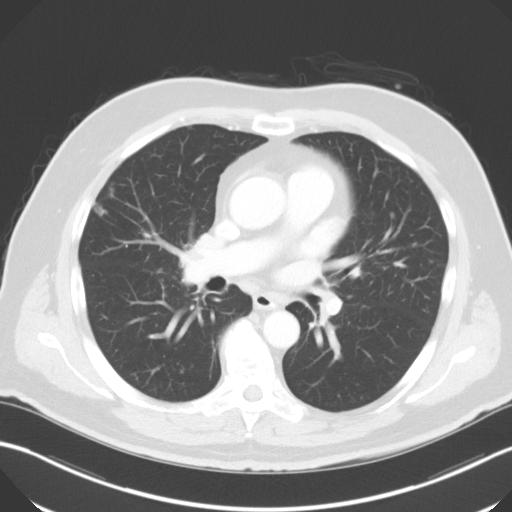
[im 120/134  mediastinal]
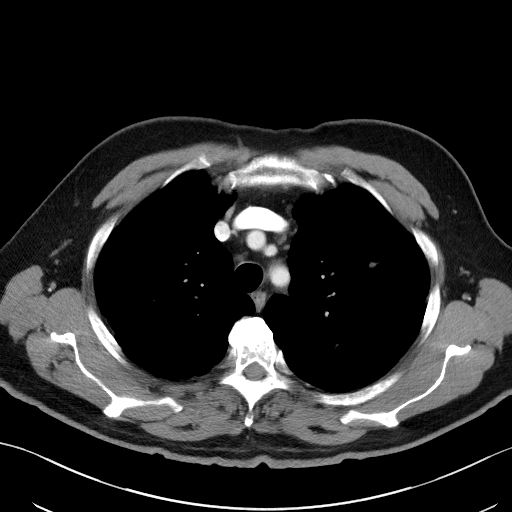
[im 120/134  lung]
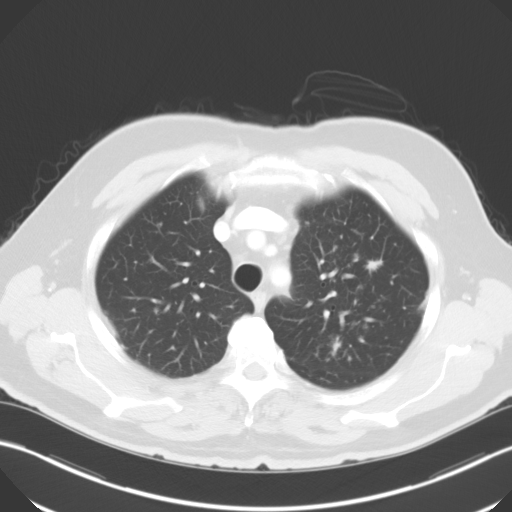

[Series 5: coronals · coronal · 0.75mm/px · 3 of 165 slices shown]
[im 33/165  lung]
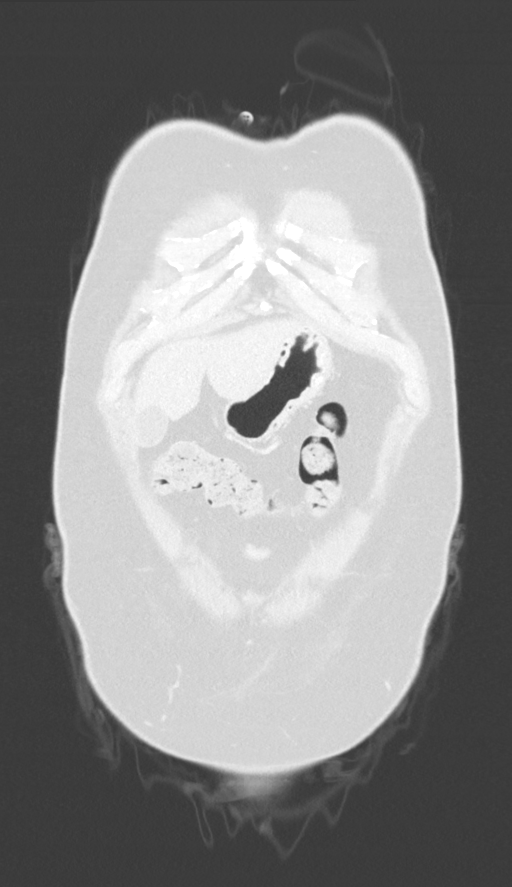
[im 66/165  lung]
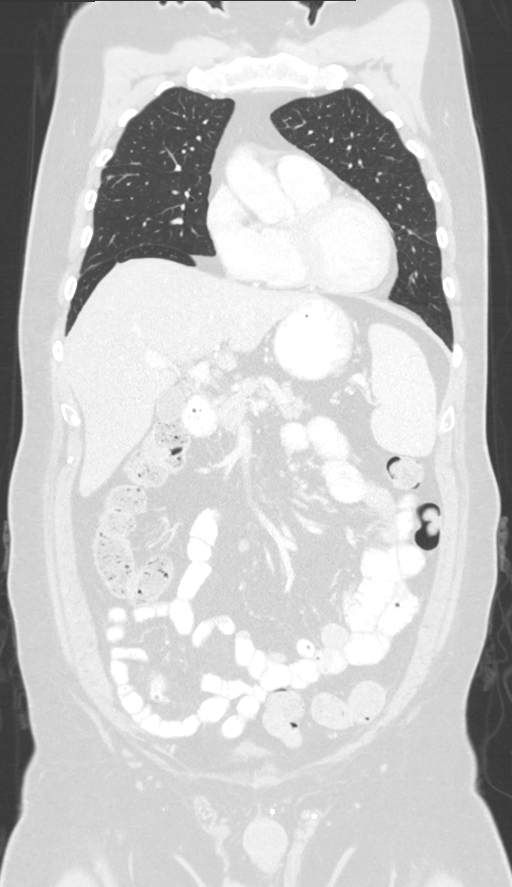
[im 99/165  lung]
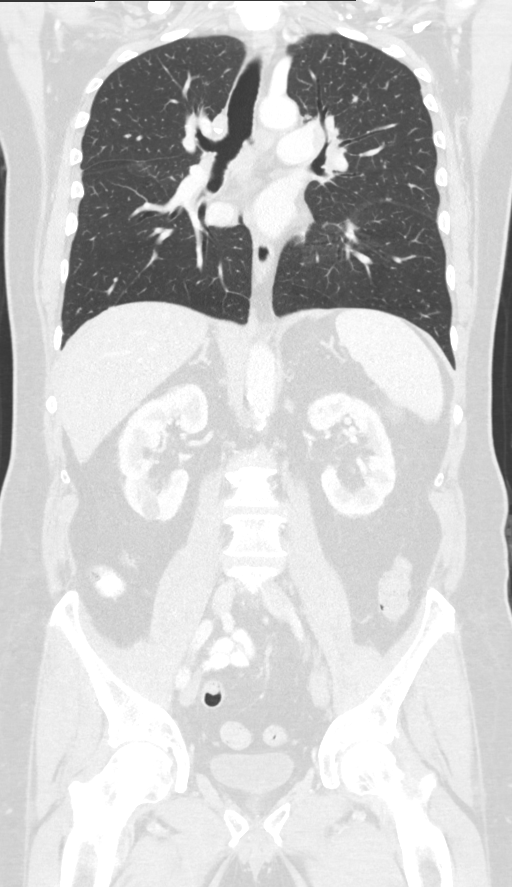

[12 of 36 positions shown; findings below may reference images not displayed]

FINDINGS: CT CHEST FINDINGS

Cardiovascular: Normal heart size. No pericardial effusion. Thoracic
aortic vascular calcifications. Aorta and main pulmonary artery are
normal in caliber.

Mediastinum/Nodes: Multiple prominent and mildly enlarged
mediastinal lymph nodes including a 1.0 cm right peritracheal lymph
node, a 1.2 cm prevascular lymph node (image 20; series 2) and a
cm subcarinal lymph node (image 26; series 2). Additional prominent
subcentimeter lymph nodes are demonstrated. Esophagus is normal in
appearance.

Lungs/Pleura: Central airways are patent. Within the right lower
lobe there is a 6.4 x 3.2 cm irregular mass (image 109; series 4).
There are multiple bilateral irregular pulmonary nodules
demonstrated throughout the lungs bilaterally with a reference
cm nodule in the left upper lobe (image 36; series 4) and a 1.5 cm
nodule within the right middle lobe (image 74; series 4). No pleural
effusion or pneumothorax.

Musculoskeletal: Multiple lytic lesions throughout the visualized
thoracic spine including the T3, T4 and T7 vertebral bodies.

CT ABDOMEN PELVIS FINDINGS

Hepatobiliary: There is a 1.9 x 1.7 cm indeterminate low-attenuation
lesion within the right hepatic lobe (image 54; series 2). There is
an additional 1.1 cm low-attenuation lesion within the hepatic dome
(image 49; series 2). Additional subcentimeter low-attenuation
lesions are demonstrated within the liver. Gallbladder is
unremarkable. No intrahepatic or extrahepatic biliary ductal
dilatation.

Pancreas: Unremarkable

Spleen:  Unremarkable

Adrenals/Urinary Tract: The adrenal glands are normal. Kidneys
enhance symmetrically with contrast. There is a 4.7 cm exophytic
cyst off the interpolar region of the left kidney. There is a 4.0 cm
cyst within the interpolar region of the right kidney and a 1.8 cm
cyst within the inferior pole of the right kidney. No suspicious
enhancing renal masses identified. No hydronephrosis. Urinary
bladder is unremarkable.

Stomach/Bowel: Normal morphology of the stomach. No abnormal bowel
wall thickening or evidence for bowel obstruction. No free fluid or
free intraperitoneal air.

Vascular/Lymphatic: Normal caliber abdominal aorta. No
retroperitoneal lymphadenopathy. Peripheral calcified
atherosclerotic plaque.

Reproductive: Prostate is heterogeneous.

Other: Small fat containing left inguinal hernia.

Musculoskeletal: Multiple lytic lesions are demonstrated throughout
the visualized lumbar spine and pelvis including a 2.4 cm lesion
within the left ilium (image 108; series 2) and a 2.4 cm lesion
within the L4 vertebral body.
IMPRESSION: 1. Large irregular mass within the right lower lobe which may
represent primary pulmonary malignancy or metastatic disease.
2. Multiple small irregular pulmonary nodules throughout the lungs
bilaterally concerning for metastatic disease.
3. Prominent and mildly enlarged mediastinal lymph nodes concerning
for metastatic disease.
4. Indeterminate hepatic lesions raising the possibility of
metastatic disease. Depending upon pathologic diagnosis and clinical
treatment, consider further evaluation with MRI.
5. Multiple lytic lesions throughout the skeleton compatible with
osseous metastatic disease.

## 2019-07-08 DIAGNOSIS — M7672 Peroneal tendinitis, left leg: Secondary | ICD-10-CM | POA: Diagnosis not present

## 2019-07-08 DIAGNOSIS — E119 Type 2 diabetes mellitus without complications: Secondary | ICD-10-CM | POA: Diagnosis not present

## 2019-07-08 DIAGNOSIS — E1142 Type 2 diabetes mellitus with diabetic polyneuropathy: Secondary | ICD-10-CM | POA: Diagnosis not present

## 2019-07-08 DIAGNOSIS — Z794 Long term (current) use of insulin: Secondary | ICD-10-CM | POA: Diagnosis not present

## 2019-07-08 DIAGNOSIS — E7800 Pure hypercholesterolemia, unspecified: Secondary | ICD-10-CM | POA: Diagnosis not present

## 2019-07-08 DIAGNOSIS — E1140 Type 2 diabetes mellitus with diabetic neuropathy, unspecified: Secondary | ICD-10-CM | POA: Diagnosis not present

## 2019-07-08 DIAGNOSIS — E669 Obesity, unspecified: Secondary | ICD-10-CM | POA: Diagnosis not present

## 2019-07-08 DIAGNOSIS — M7671 Peroneal tendinitis, right leg: Secondary | ICD-10-CM | POA: Diagnosis not present

## 2019-07-08 DIAGNOSIS — I10 Essential (primary) hypertension: Secondary | ICD-10-CM | POA: Diagnosis not present

## 2019-07-28 ENCOUNTER — Encounter: Payer: Self-pay | Admitting: Dermatology

## 2019-08-01 ENCOUNTER — Ambulatory Visit: Payer: Medicare HMO | Admitting: Dermatology

## 2019-08-01 ENCOUNTER — Other Ambulatory Visit: Payer: Self-pay

## 2019-08-01 DIAGNOSIS — L309 Dermatitis, unspecified: Secondary | ICD-10-CM | POA: Diagnosis not present

## 2019-08-01 DIAGNOSIS — I8393 Asymptomatic varicose veins of bilateral lower extremities: Secondary | ICD-10-CM | POA: Diagnosis not present

## 2019-08-01 DIAGNOSIS — L821 Other seborrheic keratosis: Secondary | ICD-10-CM | POA: Diagnosis not present

## 2019-08-01 DIAGNOSIS — I872 Venous insufficiency (chronic) (peripheral): Secondary | ICD-10-CM

## 2019-08-01 DIAGNOSIS — L817 Pigmented purpuric dermatosis: Secondary | ICD-10-CM

## 2019-08-01 DIAGNOSIS — D489 Neoplasm of uncertain behavior, unspecified: Secondary | ICD-10-CM

## 2019-08-01 DIAGNOSIS — D229 Melanocytic nevi, unspecified: Secondary | ICD-10-CM

## 2019-08-01 DIAGNOSIS — D485 Neoplasm of uncertain behavior of skin: Secondary | ICD-10-CM

## 2019-08-01 DIAGNOSIS — L719 Rosacea, unspecified: Secondary | ICD-10-CM

## 2019-08-01 DIAGNOSIS — D1800 Hemangioma unspecified site: Secondary | ICD-10-CM

## 2019-08-01 DIAGNOSIS — L578 Other skin changes due to chronic exposure to nonionizing radiation: Secondary | ICD-10-CM

## 2019-08-01 DIAGNOSIS — D2261 Melanocytic nevi of right upper limb, including shoulder: Secondary | ICD-10-CM | POA: Diagnosis not present

## 2019-08-01 DIAGNOSIS — D3617 Benign neoplasm of peripheral nerves and autonomic nervous system of trunk, unspecified: Secondary | ICD-10-CM | POA: Diagnosis not present

## 2019-08-01 NOTE — Progress Notes (Signed)
Follow-Up Visit   Subjective  George Daniels is a 74 y.o. male who presents for the following: Large dark spot (Right ankle, has been there for years, just in past few months started to itch, no pain), Red spot (Left ankle, and left upper arm, have been there for the past few months, no pain), and Recheck moles (Has some moles on his back he would like for you to recheck).  He complains of pain and trauma of the moles and would like consideration of removal. The patient has a lot of questions and discussion about the rash on his legs.  I advised him he has varicose veins, with stasis dermatitis, and [place]'s purpura He also has a rash on his left arm he would like checked.  The following portions of the chart were reviewed this encounter and updated as appropriate: Tobacco  Allergies  Meds  Problems  Med Hx  Surg Hx  Fam Hx     Review of Systems: No other skin or systemic complaints.  Objective  Well appearing patient in no apparent distress; mood and affect are within normal limits.  A focused examination was performed including lower extremities, including the legs, feet, toes, and toenails and face, neck, chest and back. Relevant physical exam findings are noted in the Assessment and Plan.  Objective  Left Upper Arm - Posterior: Scaly erythematous papules and patches +/- dyspigmentation, lichenification, excoriations.   Objective  Low back spinal: 0.5cm pink papule  Objective  left lower back 3cm left of spine: 0.5 pink papule  Objective  Right inferior lateral scapula: 0.7 pink papule  Objective  Right Shoulder - Posterior:  Blue symmetric macules   Objective  Head - Anterior (Face): Mid face erythema with telangiectasias +/- scattered inflammatory papules.   Objective  Bilateral ankle:  irregular patches of brown pigmentation  Images          Objective  Left Lower Leg - Anterior: dark blue and stick out from the skin like raised  tunnels  Objective  Bilateral lower legs: Erythematous, scaly patches involving the ankle and distal lower leg with associated lower leg edema.   Assessment & Plan  Eczema, atopic dermatitis Left Upper Arm - Posterior  Will use TMC 0.1% cream BID to Affected area on left upper arm for 1 week or until clear, patient has at home.  Neoplasm of uncertain behavior (3) Low back spinal  Epidermal / dermal shaving  Lesion length (cm):  0.5 Lesion width (cm):  0.5 Margin per side (cm):  0.2 Total excision diameter (cm):  0.9 Informed consent: discussed and consent obtained   Timeout: patient name, date of birth, surgical site, and procedure verified   Procedure prep:  Patient was prepped and draped in usual sterile fashion Prep type:  Isopropyl alcohol Anesthesia: the lesion was anesthetized in a standard fashion   Anesthetic:  1% lidocaine w/ epinephrine 1-100,000 buffered w/ 8.4% NaHCO3 Instrument used: flexible razor blade   Hemostasis achieved with: pressure, aluminum chloride and electrodesiccation   Outcome: patient tolerated procedure well   Post-procedure details: sterile dressing applied and wound care instructions given   Dressing type: bandage and petrolatum    Specimen 1 - Surgical pathology Differential Diagnosis: R/O irritated nevus vs dysplastic Check Margins: No 0.5cm pink papule  left lower back 3cm left of spine  Epidermal / dermal shaving  Lesion length (cm):  0.5 Lesion width (cm):  0.5 Margin per side (cm):  0.2 Total excision diameter (cm):  0.9  Informed consent: discussed and consent obtained   Timeout: patient name, date of birth, surgical site, and procedure verified   Procedure prep:  Patient was prepped and draped in usual sterile fashion Prep type:  Isopropyl alcohol Anesthesia: the lesion was anesthetized in a standard fashion   Anesthetic:  1% lidocaine w/ epinephrine 1-100,000 buffered w/ 8.4% NaHCO3 Instrument used: flexible razor blade    Hemostasis achieved with: pressure, aluminum chloride and electrodesiccation   Outcome: patient tolerated procedure well   Post-procedure details: sterile dressing applied and wound care instructions given   Dressing type: bandage and petrolatum    Specimen 2 - Surgical pathology Differential Diagnosis: R/O irritated nevus vs dysplastic Check Margins: No 0.5 pink papule  Right inferior lateral scapula  Epidermal / dermal shaving  Lesion length (cm): 0.7  Lesion width (cm):  0.7 Margin per side (cm):  0.2 Total excision diameter (cm):  1.1 Informed consent: discussed and consent obtained   Timeout: patient name, date of birth, surgical site, and procedure verified   Procedure prep:  Patient was prepped and draped in usual sterile fashion Prep type:  Isopropyl alcohol Anesthesia: the lesion was anesthetized in a standard fashion   Anesthetic:  1% lidocaine w/ epinephrine 1-100,000 buffered w/ 8.4% NaHCO3 Instrument used: flexible razor blade   Hemostasis achieved with: pressure, aluminum chloride and electrodesiccation   Outcome: patient tolerated procedure well   Post-procedure details: sterile dressing applied and wound care instructions given   Dressing type: bandage and petrolatum    Specimen 3 - Surgical pathology Differential Diagnosis: R/O irritated nevus vs dysplastic Check Margins: No 0.5 pink papule  Shave removal  R/O  irritated nevus vs dysplastic  Nevus Right Shoulder - Posterior  Benign, observe.    Rosacea Head - Anterior (Face)  Improved Continue current medication regimen  [place]'s purpura.  The patient has varicose veins with stasis dermatitis and [place]'s purpura.  We discussed this in detail. Bilateral ankle  The patient will observe these symptoms, and report promptly any worsening or unexpected Recommend compression stockings 15-6mmHg  Will use TMC 0.1% cream BID to Affected area on left upper arm for 1 week or until clear,  patient has at home  Varicose veins of both lower extremities, unspecified whether complicated Left Lower Leg - Anterior  The patient will observe these symptoms, and report promptly any worsening or unexpected persistence.  If well, may return prn. Recommend compression stockings 15-27mmHg  Venous stasis dermatitis of right lower extremity Bilateral lower legs  The patient will observe these symptoms, and report promptly any worsening or unexpected persistence.  If well, may return prn.   Recommend compression stockings 15-34mmHg  Seborrheic Keratoses - Stuck-on, waxy, tan-brown papules and plaques  - Discussed benign etiology and prognosis. - Observe - Call for any changes Melanocytic Nevi - Tan-brown and/or pink-flesh-colored symmetric macules and papules - Benign appearing on exam today - Observation - Call clinic for new or changing moles - Recommend daily use of broad spectrum spf 30+ sunscreen to sun-exposed areas.  Actinic Damage - diffuse scaly erythematous macules with underlying dyspigmentation - Recommend daily broad spectrum sunscreen SPF 30+ to sun-exposed areas, reapply every 2 hours as needed.  - Call for new or changing lesions. Hemangiomas - Red papules - Discussed benign nature - Observe - Call for any changes  Return for as scheduled.  Greater than 45 minutes spent with pt in evaluation and treatment. Marene Lenz, CMA, am acting as scribe for Sarina Ser, MD . Documentation:  I have reviewed the above documentation for accuracy and completeness, and I agree with the above.  Sarina Ser, MD

## 2019-08-01 NOTE — Patient Instructions (Signed)
Shave Excision Benign Lesion Wound Care Instructions  . Leave the original bandage on for 24 hours if possible.  If the bandage becomes soaked or soiled before that time, it is OK to remove it and examine the wound.  A small amount of post-operative bleeding is normal.  If excessive bleeding occurs, remove the bandage, place gauze over the site and apply continuous pressure (no peeking) over the area for 20-30 minutes.  If this does not stop the bleeding, try again for 40 minutes.  If this does not work, please call our clinic as soon as possible (even if after-hours).    . Twice a day, cleanse the wound with soap and water.  If a thick crust develops you may use a Q-tip dipped into dilute hydrogen peroxide (mix 1:1 with water) to dissolve it.  Hydrogen peroxide can slow the healing process, so use it only as needed.  After washing, apply Vaseline jelly or Polysporin ointment.  For best healing, the wound should be covered with a layer of ointment at all times.  This may mean re-applying the ointment several times a day.  For open wounds, continue until it has healed.    . If you have any swelling, keep the area elevated.  . Some redness, tenderness and white or yellow material in the wound is normal healing.  If the area becomes very sore and red, or develops a thick yellow-green material (pus), it may be infected; please notify us.    . Wound healing continues for up to one year following surgery.  It is not unusual to experience pain in the scar from time to time during the interval.  If the pain becomes severe or the scar thickens, you should notify the office.  A slight amount of redness in a scar is expected for the first six months.  After six months, the redness subsides and the scar will soften and fade.  The color difference becomes less noticeable with time.  If there are any problems, return for a post-op surgery check at your earliest convenience.  . Please call our office for any questions  or concerns.   Wear Compression stocking 15-20 mmHg

## 2019-08-02 ENCOUNTER — Encounter: Payer: Self-pay | Admitting: Dermatology

## 2019-08-05 ENCOUNTER — Telehealth: Payer: Self-pay

## 2019-08-05 NOTE — Telephone Encounter (Signed)
Patient's wife Blanch Media informed of pathology results

## 2019-08-22 DIAGNOSIS — E7800 Pure hypercholesterolemia, unspecified: Secondary | ICD-10-CM | POA: Diagnosis not present

## 2019-08-22 DIAGNOSIS — E669 Obesity, unspecified: Secondary | ICD-10-CM | POA: Diagnosis not present

## 2019-08-22 DIAGNOSIS — I10 Essential (primary) hypertension: Secondary | ICD-10-CM | POA: Diagnosis not present

## 2019-08-22 DIAGNOSIS — E119 Type 2 diabetes mellitus without complications: Secondary | ICD-10-CM | POA: Diagnosis not present

## 2019-08-22 DIAGNOSIS — E1142 Type 2 diabetes mellitus with diabetic polyneuropathy: Secondary | ICD-10-CM | POA: Diagnosis not present

## 2019-09-28 DIAGNOSIS — E7800 Pure hypercholesterolemia, unspecified: Secondary | ICD-10-CM | POA: Diagnosis not present

## 2019-09-28 DIAGNOSIS — E118 Type 2 diabetes mellitus with unspecified complications: Secondary | ICD-10-CM | POA: Diagnosis not present

## 2019-10-03 DIAGNOSIS — M25562 Pain in left knee: Secondary | ICD-10-CM | POA: Diagnosis not present

## 2019-10-03 DIAGNOSIS — M25572 Pain in left ankle and joints of left foot: Secondary | ICD-10-CM | POA: Diagnosis not present

## 2019-10-03 DIAGNOSIS — M7662 Achilles tendinitis, left leg: Secondary | ICD-10-CM | POA: Diagnosis not present

## 2019-10-05 DIAGNOSIS — E118 Type 2 diabetes mellitus with unspecified complications: Secondary | ICD-10-CM | POA: Diagnosis not present

## 2019-10-05 DIAGNOSIS — E7800 Pure hypercholesterolemia, unspecified: Secondary | ICD-10-CM | POA: Diagnosis not present

## 2019-10-05 DIAGNOSIS — F325 Major depressive disorder, single episode, in full remission: Secondary | ICD-10-CM | POA: Diagnosis not present

## 2019-10-05 DIAGNOSIS — Z794 Long term (current) use of insulin: Secondary | ICD-10-CM | POA: Diagnosis not present

## 2019-10-10 ENCOUNTER — Other Ambulatory Visit: Payer: Self-pay | Admitting: Student

## 2019-10-10 DIAGNOSIS — M25562 Pain in left knee: Secondary | ICD-10-CM

## 2019-10-10 DIAGNOSIS — M7662 Achilles tendinitis, left leg: Secondary | ICD-10-CM

## 2019-10-24 DIAGNOSIS — E119 Type 2 diabetes mellitus without complications: Secondary | ICD-10-CM | POA: Diagnosis not present

## 2019-10-27 DIAGNOSIS — D869 Sarcoidosis, unspecified: Secondary | ICD-10-CM | POA: Diagnosis not present

## 2019-10-28 DIAGNOSIS — M7582 Other shoulder lesions, left shoulder: Secondary | ICD-10-CM | POA: Diagnosis not present

## 2019-10-29 ENCOUNTER — Ambulatory Visit
Admission: RE | Admit: 2019-10-29 | Discharge: 2019-10-29 | Disposition: A | Discharge status: Home / Self Care | Payer: Medicare HMO | Source: Ambulatory Visit | Attending: Student | Admitting: Student

## 2019-10-29 ENCOUNTER — Other Ambulatory Visit: Payer: Self-pay

## 2019-10-29 DIAGNOSIS — M7662 Achilles tendinitis, left leg: Secondary | ICD-10-CM | POA: Insufficient documentation

## 2019-10-29 DIAGNOSIS — M25562 Pain in left knee: Secondary | ICD-10-CM | POA: Diagnosis not present

## 2019-10-29 DIAGNOSIS — M65872 Other synovitis and tenosynovitis, left ankle and foot: Secondary | ICD-10-CM | POA: Diagnosis not present

## 2019-10-29 DIAGNOSIS — M79672 Pain in left foot: Secondary | ICD-10-CM | POA: Diagnosis not present

## 2019-10-29 DIAGNOSIS — R2242 Localized swelling, mass and lump, left lower limb: Secondary | ICD-10-CM | POA: Diagnosis not present

## 2019-10-31 DIAGNOSIS — E669 Obesity, unspecified: Secondary | ICD-10-CM | POA: Diagnosis not present

## 2019-10-31 DIAGNOSIS — E1142 Type 2 diabetes mellitus with diabetic polyneuropathy: Secondary | ICD-10-CM | POA: Diagnosis not present

## 2019-10-31 DIAGNOSIS — I10 Essential (primary) hypertension: Secondary | ICD-10-CM | POA: Diagnosis not present

## 2019-10-31 DIAGNOSIS — E119 Type 2 diabetes mellitus without complications: Secondary | ICD-10-CM | POA: Diagnosis not present

## 2019-10-31 DIAGNOSIS — E7800 Pure hypercholesterolemia, unspecified: Secondary | ICD-10-CM | POA: Diagnosis not present

## 2019-11-03 DIAGNOSIS — M7662 Achilles tendinitis, left leg: Secondary | ICD-10-CM | POA: Diagnosis not present

## 2019-11-03 DIAGNOSIS — S83242A Other tear of medial meniscus, current injury, left knee, initial encounter: Secondary | ICD-10-CM | POA: Diagnosis not present

## 2019-11-03 DIAGNOSIS — M1711 Unilateral primary osteoarthritis, right knee: Secondary | ICD-10-CM | POA: Diagnosis not present

## 2019-11-03 DIAGNOSIS — S83231D Complex tear of medial meniscus, current injury, right knee, subsequent encounter: Secondary | ICD-10-CM | POA: Diagnosis not present

## 2019-11-10 DIAGNOSIS — M7752 Other enthesopathy of left foot: Secondary | ICD-10-CM | POA: Diagnosis not present

## 2019-11-10 DIAGNOSIS — M7662 Achilles tendinitis, left leg: Secondary | ICD-10-CM | POA: Diagnosis not present

## 2019-11-10 DIAGNOSIS — M25572 Pain in left ankle and joints of left foot: Secondary | ICD-10-CM | POA: Diagnosis not present

## 2019-11-21 ENCOUNTER — Other Ambulatory Visit
Admission: RE | Admit: 2019-11-21 | Discharge: 2019-11-21 | Disposition: A | Discharge status: Home / Self Care | Payer: Medicare HMO | Source: Ambulatory Visit | Attending: Internal Medicine | Admitting: Internal Medicine

## 2019-11-21 ENCOUNTER — Other Ambulatory Visit: Payer: Self-pay

## 2019-11-21 DIAGNOSIS — Z20822 Contact with and (suspected) exposure to covid-19: Secondary | ICD-10-CM | POA: Insufficient documentation

## 2019-11-21 DIAGNOSIS — Z01812 Encounter for preprocedural laboratory examination: Secondary | ICD-10-CM | POA: Diagnosis not present

## 2019-11-22 ENCOUNTER — Encounter: Payer: Self-pay | Admitting: Internal Medicine

## 2019-11-22 LAB — SARS CORONAVIRUS 2 (TAT 6-24 HRS): SARS Coronavirus 2: NEGATIVE

## 2019-11-23 ENCOUNTER — Encounter: Payer: Self-pay | Admitting: Internal Medicine

## 2019-11-23 ENCOUNTER — Other Ambulatory Visit: Payer: Self-pay

## 2019-11-23 ENCOUNTER — Ambulatory Visit
Admission: RE | Admit: 2019-11-23 | Discharge: 2019-11-23 | Disposition: A | Discharge status: Home / Self Care | Payer: Medicare HMO | Source: Ambulatory Visit | Attending: Internal Medicine | Admitting: Internal Medicine

## 2019-11-23 ENCOUNTER — Encounter
Admission: RE | Disposition: A | Discharge status: Home / Self Care | Payer: Self-pay | Source: Ambulatory Visit | Attending: Internal Medicine

## 2019-11-23 ENCOUNTER — Ambulatory Visit: Payer: Medicare HMO | Admitting: Certified Registered"

## 2019-11-23 DIAGNOSIS — K5730 Diverticulosis of large intestine without perforation or abscess without bleeding: Secondary | ICD-10-CM | POA: Insufficient documentation

## 2019-11-23 DIAGNOSIS — K219 Gastro-esophageal reflux disease without esophagitis: Secondary | ICD-10-CM | POA: Diagnosis not present

## 2019-11-23 DIAGNOSIS — G2581 Restless legs syndrome: Secondary | ICD-10-CM | POA: Diagnosis not present

## 2019-11-23 DIAGNOSIS — Z86718 Personal history of other venous thrombosis and embolism: Secondary | ICD-10-CM | POA: Diagnosis not present

## 2019-11-23 DIAGNOSIS — Z794 Long term (current) use of insulin: Secondary | ICD-10-CM | POA: Insufficient documentation

## 2019-11-23 DIAGNOSIS — Z7982 Long term (current) use of aspirin: Secondary | ICD-10-CM | POA: Diagnosis not present

## 2019-11-23 DIAGNOSIS — K640 First degree hemorrhoids: Secondary | ICD-10-CM | POA: Insufficient documentation

## 2019-11-23 DIAGNOSIS — E785 Hyperlipidemia, unspecified: Secondary | ICD-10-CM | POA: Insufficient documentation

## 2019-11-23 DIAGNOSIS — I10 Essential (primary) hypertension: Secondary | ICD-10-CM | POA: Diagnosis not present

## 2019-11-23 DIAGNOSIS — D869 Sarcoidosis, unspecified: Secondary | ICD-10-CM | POA: Diagnosis not present

## 2019-11-23 DIAGNOSIS — K5790 Diverticulosis of intestine, part unspecified, without perforation or abscess without bleeding: Secondary | ICD-10-CM | POA: Diagnosis not present

## 2019-11-23 DIAGNOSIS — F329 Major depressive disorder, single episode, unspecified: Secondary | ICD-10-CM | POA: Diagnosis not present

## 2019-11-23 DIAGNOSIS — G4730 Sleep apnea, unspecified: Secondary | ICD-10-CM | POA: Insufficient documentation

## 2019-11-23 DIAGNOSIS — Z1211 Encounter for screening for malignant neoplasm of colon: Secondary | ICD-10-CM | POA: Insufficient documentation

## 2019-11-23 DIAGNOSIS — M1990 Unspecified osteoarthritis, unspecified site: Secondary | ICD-10-CM | POA: Insufficient documentation

## 2019-11-23 DIAGNOSIS — E7800 Pure hypercholesterolemia, unspecified: Secondary | ICD-10-CM | POA: Diagnosis not present

## 2019-11-23 DIAGNOSIS — K648 Other hemorrhoids: Secondary | ICD-10-CM | POA: Diagnosis not present

## 2019-11-23 DIAGNOSIS — Z791 Long term (current) use of non-steroidal anti-inflammatories (NSAID): Secondary | ICD-10-CM | POA: Diagnosis not present

## 2019-11-23 DIAGNOSIS — E119 Type 2 diabetes mellitus without complications: Secondary | ICD-10-CM | POA: Insufficient documentation

## 2019-11-23 DIAGNOSIS — Z8371 Family history of colonic polyps: Secondary | ICD-10-CM | POA: Insufficient documentation

## 2019-11-23 DIAGNOSIS — Z79899 Other long term (current) drug therapy: Secondary | ICD-10-CM | POA: Insufficient documentation

## 2019-11-23 HISTORY — DX: Hyperlipidemia, unspecified: E78.5

## 2019-11-23 HISTORY — DX: Unspecified hemorrhoids: K64.9

## 2019-11-23 HISTORY — PX: COLONOSCOPY WITH PROPOFOL: SHX5780

## 2019-11-23 HISTORY — DX: Personal history of other infectious and parasitic diseases: Z86.19

## 2019-11-23 HISTORY — DX: Acute embolism and thrombosis of unspecified deep veins of unspecified lower extremity: I82.409

## 2019-11-23 HISTORY — DX: Cortical age-related cataract, unspecified eye: H25.019

## 2019-11-23 LAB — GLUCOSE, CAPILLARY: Glucose-Capillary: 98 mg/dL (ref 70–99)

## 2019-11-23 SURGERY — COLONOSCOPY WITH PROPOFOL
Anesthesia: General

## 2019-11-23 MED ORDER — SODIUM CHLORIDE 0.9 % IV SOLN
INTRAVENOUS | Status: DC
  Administered 2019-11-23: 1000 mL via INTRAVENOUS

## 2019-11-23 MED ORDER — LIDOCAINE HCL (CARDIAC) PF 100 MG/5ML IV SOSY
PREFILLED_SYRINGE | INTRAVENOUS | Status: DC | PRN
  Administered 2019-11-23: 100 mg via INTRAVENOUS

## 2019-11-23 MED ORDER — PROPOFOL 10 MG/ML IV BOLUS: INTRAVENOUS | Status: DC | PRN

## 2019-11-23 MED ORDER — GLYCOPYRROLATE 0.2 MG/ML IJ SOLN
INTRAMUSCULAR | Status: DC | PRN
  Administered 2019-11-23: .2 mg via INTRAVENOUS

## 2019-11-23 MED ORDER — PROPOFOL 10 MG/ML IV BOLUS
INTRAVENOUS | Status: DC | PRN
  Administered 2019-11-23: 10 mg via INTRAVENOUS
  Administered 2019-11-23: 60 mg via INTRAVENOUS
  Administered 2019-11-23: 10 mg via INTRAVENOUS

## 2019-11-23 MED ORDER — PROPOFOL 500 MG/50ML IV EMUL
INTRAVENOUS | Status: DC | PRN
  Administered 2019-11-23: 165 ug/kg/min via INTRAVENOUS

## 2019-11-23 NOTE — Transfer of Care (Signed)
Immediate Anesthesia Transfer of Care Note  Patient: George Daniels  Procedure(s) Performed: COLONOSCOPY WITH PROPOFOL (N/A )  Patient Location: Endoscopy Unit  Anesthesia Type:General  Level of Consciousness: drowsy and patient cooperative  Airway & Oxygen Therapy: Patient Spontanous Breathing and Patient connected to face mask oxygen  Post-op Assessment: Report given to RN and Post -op Vital signs reviewed and stable  Post vital signs: Reviewed and stable  Last Vitals:  Vitals Value Taken Time  BP    Temp    Pulse    Resp    SpO2      Last Pain:  Vitals:   11/23/19 1319  TempSrc: Temporal  PainSc: 0-No pain      Patients Stated Pain Goal: 0 (85/99/23 4144)  Complications: No complications documented.

## 2019-11-23 NOTE — Interval H&P Note (Signed)
History and Physical Interval Note:  11/23/2019 2:02 PM  George Daniels  has presented today for surgery, with the diagnosis of FAM HX COLON POLYPS.  The various methods of treatment have been discussed with the patient and family. After consideration of risks, benefits and other options for treatment, the patient has consented to  Procedure(s): COLONOSCOPY WITH PROPOFOL (N/A) as a surgical intervention.  The patient's history has been reviewed, patient examined, no change in status, stable for surgery.  I have reviewed the patient's chart and labs.  Questions were answered to the patient's satisfaction.     [place], [place]

## 2019-11-23 NOTE — Op Note (Signed)
[facility] Gastroenterology Patient Name: George Daniels Procedure Date: 11/23/2019 1:30 PM MRN: [id number] Account #: [id number] Date of Birth: 06/12/1945 Admit Type: Outpatient Age: 74 Room: [facility] ENDO ROOM 3 Gender: Male Note Status: Finalized Procedure:             Colonoscopy Indications:           Colon cancer screening in patient at increased risk:                         Family history of 1st-degree relative with colon polyps Providers:             [name]. Alice Reichert MD, MD Referring MD:          Ocie Cornfield. Ouida Sills MD, MD (Referring MD) Medicines:             Propofol per Anesthesia Complications:         No immediate complications. Procedure:             Pre-Anesthesia Assessment:                        - The risks and benefits of the procedure and the                         sedation options and risks were discussed with the                         patient. All questions were answered and informed                         consent was obtained.                        - Patient identification and proposed procedure were                         verified prior to the procedure by the nurse. The                         procedure was verified in the procedure room.                        - ASA Grade Assessment: III - A patient with severe                         systemic disease.                        - After reviewing the risks and benefits, the patient                         was deemed in satisfactory condition to undergo the                         procedure.                        After obtaining informed consent, the colonoscope was  passed under direct vision. Throughout the procedure,                         the patient's blood pressure, pulse, and oxygen                         saturations were monitored continuously. The                         Colonoscope was introduced through the anus and                         advanced to  the the cecum, identified by appendiceal                         orifice and ileocecal valve. The colonoscopy was                         performed without difficulty. The patient tolerated                         the procedure well. The quality of the bowel                         preparation was adequate. The ileocecal valve,                         appendiceal orifice, and rectum were photographed. Findings:      The perianal and digital rectal examinations were normal. Pertinent       negatives include normal sphincter tone and no palpable rectal lesions.      Many small and large-mouthed diverticula were found in the sigmoid colon.      Non-bleeding internal hemorrhoids were found during retroflexion. The       hemorrhoids were Grade I (internal hemorrhoids that do not prolapse).      There is no endoscopic evidence of polyps in the entire colon.      Normal mucosa was found in the entire colon. Impression:            - Diverticulosis in the sigmoid colon.                        - Non-bleeding internal hemorrhoids.                        - No specimens collected. Recommendation:        - Patient has a contact number available for                         emergencies. The signs and symptoms of potential                         delayed complications were discussed with the patient.                         Return to normal activities tomorrow. Written                         discharge instructions were provided to the patient.                        -  Resume previous diet.                        - Continue present medications.                        - Repeat colonoscopy in 5 years for screening purposes.                        - Return to GI office PRN.                        - The findings and recommendations were discussed with                         the patient. Procedure Code(s):     --- Professional ---                        D4287, Colorectal cancer screening; colonoscopy on                          individual at high risk Diagnosis Code(s):     --- Professional ---                        K57.30, Diverticulosis of large intestine without                         perforation or abscess without bleeding                        K64.0, First degree hemorrhoids                        Z83.71, Family history of colonic polyps CPT copyright 2019 [employer]. All rights reserved. The codes documented in this report are preliminary and upon coder review may  be revised to meet current compliance requirements. Efrain Sella MD, MD 11/23/2019 2:28:39 PM This report has been signed electronically. Number of Addenda: 0 Note Initiated On: 11/23/2019 1:30 PM Scope Withdrawal Time: 0 hours 8 minutes 47 seconds  Total Procedure Duration: 0 hours 14 minutes 46 seconds  Estimated Blood Loss:  Estimated blood loss: none. Estimated blood loss: none.      [facility]

## 2019-11-23 NOTE — Anesthesia Procedure Notes (Signed)
Procedure Name: General with mask airway Performed by: Fletcher-Harrison, Queena Monrreal, CRNA Pre-anesthesia Checklist: Patient identified, Emergency Drugs available, Suction available and Patient being monitored Patient Re-evaluated:Patient Re-evaluated prior to induction Oxygen Delivery Method: Simple face mask Induction Type: IV induction Placement Confirmation: positive ETCO2 and CO2 detector Dental Injury: Teeth and Oropharynx as per pre-operative assessment        

## 2019-11-23 NOTE — H&P (Signed)
Outpatient short stay form Pre-procedure 11/23/2019 2:01 PM George Daniels George Daniels, M.D.  Primary Physician: George Daniels, M.D.  Reason for visit:  Family history of colon polyps  History of present illness:   74 year old patient presenting for family history of colon polyps- Mother. Patient denies any change in bowel habits, rectal bleeding or involuntary weight loss.     Current Facility-Administered Medications:  .  0.9 %  sodium chloride infusion, , Intravenous, Continuous, [place], George Pike, MD, Last Rate: 20 mL/hr at 11/23/19 1343, 1,000 mL at 11/23/19 1343  Medications Prior to Admission  Medication Sig Dispense Refill Last Dose  . ALPRAZolam (XANAX) 0.5 MG tablet Take 0.5 mg by mouth at bedtime as needed for sleep.    11/22/2019 at Unknown time  . amLODipine (NORVASC) 5 MG tablet Take 5 mg by mouth daily.    11/22/2019 at Unknown time  . aspirin EC 81 MG tablet Take 81 mg by mouth daily.   11/22/2019 at Unknown time  . atorvastatin (LIPITOR) 10 MG tablet Take 10 mg by mouth daily.    11/22/2019 at Unknown time  . azaTHIOprine (IMURAN) 50 MG tablet Take by mouth.   11/22/2019 at Unknown time  . B-D UF III MINI PEN NEEDLES 31G X 5 MM MISC    11/22/2019 at Unknown time  . cetirizine (ZYRTEC) 10 MG tablet Take 10 mg by mouth daily.   11/22/2019 at Unknown time  . citalopram (CELEXA) 40 MG tablet Take 40 mg by mouth daily.    11/22/2019 at Unknown time  . clotrimazole (LOTRIMIN) 1 % cream Apply 1 application topically every other day.   11/22/2019 at Unknown time  . gabapentin (NEURONTIN) 300 MG capsule Take 300 mg by mouth 3 (three) times daily.    11/22/2019 at Unknown time  . hydrocortisone cream 1 % Apply 1 application topically every other day.   Past Week at Unknown time  . insulin aspart (NOVOLOG) 100 UNIT/ML FlexPen Sliding scale three times daily before meals, max 40 units daily   Past Week at Unknown time  . insulin glargine (LANTUS) 100 UNIT/ML Solostar Pen Inject into the skin.    11/22/2019 at Unknown time  . ketoconazole (NIZORAL) 2 % shampoo Apply 1 application topically 3 (three) times a week.   Past Week at Unknown time  . lisinopril-hydrochlorothiazide (PRINZIDE,ZESTORETIC) 20-25 MG tablet Take 1 tablet by mouth daily.    11/22/2019 at Unknown time  . meloxicam (MOBIC) 15 MG tablet Take 15 mg by mouth daily.   11/22/2019 at Unknown time  . metFORMIN (GLUCOPHAGE-XR) 500 MG 24 hr tablet Take 500 mg by mouth daily with supper.    Past Week at Unknown time  . Multiple Vitamin (MULTIVITAMIN WITH MINERALS) TABS tablet Take 1 tablet by mouth 3 (three) times a week.   Past Week at Unknown time  . omeprazole (PRILOSEC) 40 MG capsule Take 40 mg by mouth daily at 6 PM.    11/22/2019 at Unknown time  . oxyCODONE (ROXICODONE) 5 MG immediate release tablet Take 1-2 tablets (5-10 mg total) by mouth every 4 (four) hours as needed. 40 tablet 0 Past Week at Unknown time  . pramipexole (MIRAPEX) 0.5 MG tablet Take 0.5 mg by mouth 3 (three) times daily.   11/22/2019 at Unknown time  . sildenafil (REVATIO) 20 MG tablet Take 20 mg by mouth 3 (three) times daily.     George Daniels tiZANidine (ZANAFLEX) 2 MG tablet Take 2 mg by mouth daily.    11/22/2019  at Unknown time  . traZODone (DESYREL) 100 MG tablet Take 100 mg by mouth at bedtime.   11/22/2019 at Unknown time  . triamcinolone cream (KENALOG) 0.1 %    Past Month at Unknown time  . furosemide (LASIX) 40 MG tablet Take 40 mg by mouth daily. (Patient not taking: Reported on 11/23/2019)   Not Taking at Unknown time  . methotrexate (RHEUMATREX) 2.5 MG tablet Take 5 tablets (12.5 mg total) by mouth once a week. Caution:Chemotherapy. Protect from light. (Patient not taking: Reported on 08/01/2019) 75 tablet 1 Completed Course at Unknown time  . potassium chloride (K-DUR) 10 MEQ tablet Take 10 mEq by mouth daily. (Patient not taking: Reported on 11/23/2019)   Completed Course at Unknown time  . predniSONE (DELTASONE) 5 MG tablet Take 5 mg by mouth daily with  breakfast. (Patient not taking: Reported on 11/23/2019)   Completed Course at Unknown time     Allergies  Allergen Reactions  . Penicillins Rash    Has patient had a PCN reaction causing immediate rash, facial/tongue/throat swelling, SOB or lightheadedness with hypotension: No Has patient had a PCN reaction causing severe rash involving mucus membranes or skin necrosis: No Has patient had a PCN reaction that required hospitalization: No Has patient had a PCN reaction occurring within the last 10 years: No If all of the above answers are "NO", then may proceed with Cephalosporin use.      Past Medical History:  Diagnosis Date  . Arthritis    back  . Cataract cortical, senile   . Complication of anesthesia    nausea  . Depression   . Diabetes mellitus without complication ([place])   . DVT (deep venous thrombosis) ([place])   . Family history of adverse reaction to anesthesia    daughter nausea  . GERD (gastroesophageal reflux disease)   . Hemorrhoids   . History of chicken pox   . Hyperlipidemia   . Hypertension   . Nausea   . RLS (restless legs syndrome)   . Sarcoidosis   . Sleep apnea    does not use cpap    Review of systems:  Otherwise negative.    Physical Exam  Gen: Alert, oriented. Appears stated age.  HEENT: [place]/AT. PERRLA. Lungs: CTA, no wheezes. CV: RR nl S1, S2. Abd: soft, benign, no masses. BS+ Ext: No edema. Pulses 2+    Planned procedures: Proceed with colonoscopy. The patient understands the nature of the planned procedure, indications, risks, alternatives and potential complications including but not limited to bleeding, infection, perforation, damage to internal organs and possible oversedation/side effects from anesthesia. The patient agrees and gives consent to proceed.  Please refer to procedure notes for findings, recommendations and patient disposition/instructions.     George Daniels George Daniels, M.D. Gastroenterology 11/23/2019  2:01 PM

## 2019-11-23 NOTE — Anesthesia Preprocedure Evaluation (Signed)
Anesthesia Evaluation  Patient identified by MRN, date of birth, ID band Patient awake    Reviewed: Allergy & Precautions, H&P , NPO status , Patient's Chart, lab work & pertinent test results  History of Anesthesia Complications (+) Family history of anesthesia reaction and history of anesthetic complications  Airway Mallampati: III  TM Distance: <3 FB Neck ROM: limited    Dental  (+) Chipped   Pulmonary sleep apnea ,    Pulmonary exam normal        Cardiovascular Exercise Tolerance: Good hypertension, Normal cardiovascular exam     Neuro/Psych PSYCHIATRIC DISORDERS negative neurological ROS  negative psych ROS   GI/Hepatic Neg liver ROS, GERD  Medicated and Controlled,  Endo/Other  diabetes, Type 2  Renal/GU negative Renal ROS  negative genitourinary   Musculoskeletal  (+) Arthritis ,   Abdominal   Peds  Hematology negative hematology ROS (+)   Anesthesia Other Findings Past Medical History: No date: Arthritis     Comment:  back No date: Cataract cortical, senile No date: Complication of anesthesia     Comment:  nausea No date: Depression No date: Diabetes mellitus without complication ([facility]) No date: DVT (deep venous thrombosis) ([facility]) No date: Family history of adverse reaction to anesthesia     Comment:  daughter nausea No date: GERD (gastroesophageal reflux disease) No date: Hemorrhoids No date: History of chicken pox No date: Hyperlipidemia No date: Hypertension No date: Nausea No date: RLS (restless legs syndrome) No date: Sarcoidosis No date: Sleep apnea     Comment:  does not use cpap  Past Surgical History: 2014: ACHILLES TENDON REPAIR; Right No date: CATARACT EXTRACTION, BILATERAL 03/31/2018: EXCISION MASS NECK; Right     Comment:  Procedure: EXCISION MASS NECK;  Surgeon: Clyde Canterbury,               MD;  Location: [facility] ORS;  Service: ENT;  Laterality:               Right; No date: EYE  SURGERY; Bilateral 12/09/2018: KNEE ARTHROSCOPY WITH MEDIAL MENISECTOMY; Right     Comment:  Procedure: KNEE ARTHROSCOPY;  Surgeon: Corky Mull,               MD;  Location: [facility] ORS;  Service: Orthopedics;                Laterality: Right; 08/02/2015: SHOULDER ARTHROSCOPY WITH OPEN ROTATOR CUFF REPAIR; Right     Comment:  Procedure: SHOULDER ARTHROSCOPY WITH LIMITED               DEBRIDEMENT,  MINI OPEN ROTATOR CUFF REPAIR,               DECOMPRESSION;  Surgeon: Corky Mull, MD;  Location:               [facility] ORS;  Service: Orthopedics;  Laterality: Right; 10/02/2016: TENNIS ELBOW RELEASE/NIRSCHEL PROCEDURE; Right     Comment:  Procedure: TENNIS ELBOW / OPEN DEBRIDMENT OF THE COMMON               EXTENSOR ORGIN OF RIGHT ELBOW;  Surgeon: Corky Mull,               MD;  Location: [facility] ORS;  Service: Orthopedics;                Laterality: Right; No date: TONSILLECTOMY  BMI    Body Mass Index: 33.50 kg/m      Reproductive/Obstetrics negative OB ROS  Anesthesia Physical Anesthesia Plan  ASA: III  Anesthesia Plan: General   Post-op Pain Management:    Induction: Intravenous  PONV Risk Score and Plan: Propofol infusion and TIVA  Airway Management Planned: Natural Airway and Nasal Cannula  Additional Equipment:   Intra-op Plan:   Post-operative Plan:   Informed Consent: I have reviewed the patients History and Physical, chart, labs and discussed the procedure including the risks, benefits and alternatives for the proposed anesthesia with the patient or authorized representative who has indicated his/her understanding and acceptance.     Dental Advisory Given  Plan Discussed with: Anesthesiologist, CRNA and Surgeon  Anesthesia Plan Comments: (Patient consented for risks of anesthesia including but not limited to:  - adverse reactions to medications - risk of intubation if required - damage to eyes, teeth, lips or  other oral mucosa - nerve damage due to positioning  - sore throat or hoarseness - Damage to heart, brain, nerves, lungs, other parts of body or loss of life  Patient voiced understanding.)        Anesthesia Quick Evaluation

## 2019-11-24 ENCOUNTER — Encounter: Payer: Self-pay | Admitting: Internal Medicine

## 2019-11-24 NOTE — Anesthesia Postprocedure Evaluation (Signed)
Anesthesia Post Note  Patient: George Daniels  Procedure(s) Performed: COLONOSCOPY WITH PROPOFOL (N/A )  Patient location during evaluation: Endoscopy Anesthesia Type: General Level of consciousness: awake and alert Pain management: pain level controlled Vital Signs Assessment: post-procedure vital signs reviewed and stable Respiratory status: spontaneous breathing, nonlabored ventilation, respiratory function stable and patient connected to nasal cannula oxygen Cardiovascular status: blood pressure returned to baseline and stable Postop Assessment: no apparent nausea or vomiting Anesthetic complications: no   No complications documented.   Last Vitals:  Vitals:   11/23/19 1448 11/23/19 1458  BP: 120/68 117/71  Pulse: 61 (!) 58  Resp: 13 18  Temp:    SpO2: 97% 97%    Last Pain:  Vitals:   11/24/19 0753  TempSrc:   PainSc: 0-No pain                 Precious Haws Kenzlei Runions

## 2019-12-01 DIAGNOSIS — D869 Sarcoidosis, unspecified: Secondary | ICD-10-CM | POA: Diagnosis not present

## 2019-12-01 DIAGNOSIS — I5189 Other ill-defined heart diseases: Secondary | ICD-10-CM

## 2019-12-01 HISTORY — DX: Other ill-defined heart diseases: I51.89

## 2019-12-05 DIAGNOSIS — G2581 Restless legs syndrome: Secondary | ICD-10-CM | POA: Diagnosis not present

## 2019-12-05 DIAGNOSIS — R413 Other amnesia: Secondary | ICD-10-CM | POA: Diagnosis not present

## 2019-12-05 DIAGNOSIS — R252 Cramp and spasm: Secondary | ICD-10-CM | POA: Diagnosis not present

## 2020-01-30 DIAGNOSIS — M25532 Pain in left wrist: Secondary | ICD-10-CM | POA: Diagnosis not present

## 2020-01-31 DIAGNOSIS — D869 Sarcoidosis, unspecified: Secondary | ICD-10-CM | POA: Diagnosis not present

## 2020-01-31 DIAGNOSIS — E7800 Pure hypercholesterolemia, unspecified: Secondary | ICD-10-CM | POA: Diagnosis not present

## 2020-01-31 DIAGNOSIS — Z01818 Encounter for other preprocedural examination: Secondary | ICD-10-CM | POA: Diagnosis not present

## 2020-01-31 DIAGNOSIS — E118 Type 2 diabetes mellitus with unspecified complications: Secondary | ICD-10-CM | POA: Diagnosis not present

## 2020-02-06 DIAGNOSIS — D860 Sarcoidosis of lung: Secondary | ICD-10-CM | POA: Diagnosis not present

## 2020-02-07 DIAGNOSIS — D869 Sarcoidosis, unspecified: Secondary | ICD-10-CM | POA: Diagnosis not present

## 2020-02-07 DIAGNOSIS — E7800 Pure hypercholesterolemia, unspecified: Secondary | ICD-10-CM | POA: Diagnosis not present

## 2020-02-07 DIAGNOSIS — F325 Major depressive disorder, single episode, in full remission: Secondary | ICD-10-CM | POA: Diagnosis not present

## 2020-02-07 DIAGNOSIS — Z794 Long term (current) use of insulin: Secondary | ICD-10-CM | POA: Diagnosis not present

## 2020-02-07 DIAGNOSIS — E118 Type 2 diabetes mellitus with unspecified complications: Secondary | ICD-10-CM | POA: Diagnosis not present

## 2020-02-07 DIAGNOSIS — Z0000 Encounter for general adult medical examination without abnormal findings: Secondary | ICD-10-CM | POA: Diagnosis not present

## 2020-02-09 DIAGNOSIS — I10 Essential (primary) hypertension: Secondary | ICD-10-CM | POA: Diagnosis not present

## 2020-02-09 DIAGNOSIS — E669 Obesity, unspecified: Secondary | ICD-10-CM | POA: Diagnosis not present

## 2020-02-09 DIAGNOSIS — E1142 Type 2 diabetes mellitus with diabetic polyneuropathy: Secondary | ICD-10-CM | POA: Diagnosis not present

## 2020-02-09 DIAGNOSIS — E7800 Pure hypercholesterolemia, unspecified: Secondary | ICD-10-CM | POA: Diagnosis not present

## 2020-02-09 DIAGNOSIS — E119 Type 2 diabetes mellitus without complications: Secondary | ICD-10-CM | POA: Diagnosis not present

## 2020-02-26 ENCOUNTER — Encounter: Payer: Self-pay | Admitting: Dermatology

## 2020-02-27 ENCOUNTER — Ambulatory Visit: Payer: Medicare HMO | Admitting: Dermatology

## 2020-02-27 ENCOUNTER — Other Ambulatory Visit: Payer: Self-pay

## 2020-02-27 DIAGNOSIS — Z1283 Encounter for screening for malignant neoplasm of skin: Secondary | ICD-10-CM | POA: Diagnosis not present

## 2020-02-27 DIAGNOSIS — L820 Inflamed seborrheic keratosis: Secondary | ICD-10-CM

## 2020-02-27 DIAGNOSIS — L821 Other seborrheic keratosis: Secondary | ICD-10-CM | POA: Diagnosis not present

## 2020-02-27 DIAGNOSIS — I8393 Asymptomatic varicose veins of bilateral lower extremities: Secondary | ICD-10-CM

## 2020-02-27 DIAGNOSIS — L817 Pigmented purpuric dermatosis: Secondary | ICD-10-CM

## 2020-02-27 DIAGNOSIS — L814 Other melanin hyperpigmentation: Secondary | ICD-10-CM | POA: Diagnosis not present

## 2020-02-27 DIAGNOSIS — L719 Rosacea, unspecified: Secondary | ICD-10-CM

## 2020-02-27 DIAGNOSIS — D229 Melanocytic nevi, unspecified: Secondary | ICD-10-CM | POA: Diagnosis not present

## 2020-02-27 DIAGNOSIS — L578 Other skin changes due to chronic exposure to nonionizing radiation: Secondary | ICD-10-CM

## 2020-02-27 DIAGNOSIS — I872 Venous insufficiency (chronic) (peripheral): Secondary | ICD-10-CM | POA: Diagnosis not present

## 2020-02-27 DIAGNOSIS — D1800 Hemangioma unspecified site: Secondary | ICD-10-CM

## 2020-02-27 MED ORDER — KETOCONAZOLE 2 % EX SHAM: 1.0000 "application " | MEDICATED_SHAMPOO | CUTANEOUS | 4 refills | Status: DC

## 2020-02-27 NOTE — Patient Instructions (Signed)

## 2020-02-27 NOTE — Progress Notes (Signed)
Follow-Up Visit   Subjective  George Daniels is a 74 y.o. male who presents for the following: TBSE.  He has several areas to be evaluated. The patient presents for Total-Body Skin Exam (TBSE) for skin cancer screening and mole check.  Patient here for full body skin exam and skin cancer screening. No history of skin cancer. Nothing new or changing that patient is aware of.  The following portions of the chart were reviewed this encounter and updated as appropriate:  Tobacco  Allergies  Meds  Problems  Med Hx  Surg Hx  Fam Hx     Review of Systems:  No other skin or systemic complaints except as noted in HPI or Assessment and Plan.  Objective  Well appearing patient in no apparent distress; mood and affect are within normal limits.  A full examination was performed including scalp, head, eyes, ears, nose, lips, neck, chest, axillae, abdomen, back, buttocks, bilateral upper extremities, bilateral lower extremities, hands, feet, fingers, toes, fingernails, and toenails. All findings within normal limits unless otherwise noted below.  Objective  Face: Mid face erythema with telangiectasias +/- scattered inflammatory papules.   Objective  Left posptauricular x 2, right postauricular x 1, left popliteal x 3 (6): Erythematous keratotic or waxy stuck-on papule or plaque.   Objective  Lower Legs: Erythematous, scaly patches involving the ankle and distal lower leg with associated lower leg edema.   Objective  Lower Legs: Varicose veins   Assessment & Plan  Rosacea Face Rosacea is a chronic progressive skin condition usually affecting the face of adults. It is treatable but not curable. It sometimes affects the eyes (ocular rosacea) as well. It may respond to topical and/or systemic medication and can flare with stress, sun exposure, alcohol, exercise and some foods.  Benign-appearing.  Observation.  Call clinic for new or changing lesions.  Recommend daily use of broad  spectrum spf 30+ sunscreen to sun-exposed areas.   Discussed treatment with laser.  Patient declines at this time.   Inflamed seborrheic keratosis (6) Left posptauricular x 2, right postauricular x 1, left popliteal x 3  Destruction of lesion - Left posptauricular x 2, right postauricular x 1, left popliteal x 3 Complexity: simple   Destruction method: cryotherapy   Informed consent: discussed and consent obtained   Timeout:  patient name, date of birth, surgical site, and procedure verified Lesion destroyed using liquid nitrogen: Yes   Region frozen until ice ball extended beyond lesion: Yes   Outcome: patient tolerated procedure well with no complications   Post-procedure details: wound care instructions given    Venous stasis dermatitis of left lower extremity Lower Legs Recommend compression socks daily  Schamberg's purpura related to venous stasis- benign but chronic Lower Legs Graduated compression stockings  Spider veins of both lower extremities Lower Legs Sclerotherapy is an option. Benign-appearing.  Observation.  Call clinic for new or changing lesions.  Recommend daily use of broad spectrum spf 30+ sunscreen to sun-exposed areas.   Lentigines - Scattered tan macules - Discussed due to sun exposure - Benign, observe - Call for any changes  Seborrheic Keratoses - Stuck-on, waxy, tan-brown papules and plaques  - Discussed benign etiology and prognosis. - Observe - Call for any changes  Melanocytic Nevi - Tan-brown and/or pink-flesh-colored symmetric macules and papules - Benign appearing on exam today - Observation - Call clinic for new or changing moles - Recommend daily use of broad spectrum spf 30+ sunscreen to sun-exposed areas.   Hemangiomas -  Red papules - Discussed benign nature - Observe - Call for any changes  Actinic Damage - Chronic, secondary to cumulative UV/sun exposure - diffuse scaly erythematous macules with underlying  dyspigmentation - Recommend daily broad spectrum sunscreen SPF 30+ to sun-exposed areas, reapply every 2 hours as needed.  - Call for new or changing lesions.  Skin cancer screening performed today.  Return in about 1 year (around 02/26/2021) for TBSE.  Graciella Belton, RMA, am acting as scribe for Sarina Ser, MD . Documentation: I have reviewed the above documentation for accuracy and completeness, and I agree with the above.  Sarina Ser, MD

## 2020-02-28 ENCOUNTER — Encounter: Payer: Self-pay | Admitting: Dermatology

## 2020-05-09 DIAGNOSIS — S63650A Sprain of metacarpophalangeal joint of right index finger, initial encounter: Secondary | ICD-10-CM | POA: Diagnosis not present

## 2020-05-09 DIAGNOSIS — M7752 Other enthesopathy of left foot: Secondary | ICD-10-CM | POA: Diagnosis not present

## 2020-05-09 DIAGNOSIS — M25572 Pain in left ankle and joints of left foot: Secondary | ICD-10-CM | POA: Diagnosis not present

## 2020-05-09 DIAGNOSIS — M7662 Achilles tendinitis, left leg: Secondary | ICD-10-CM | POA: Diagnosis not present

## 2020-05-09 DIAGNOSIS — G8929 Other chronic pain: Secondary | ICD-10-CM | POA: Diagnosis not present

## 2020-05-09 DIAGNOSIS — M25541 Pain in joints of right hand: Secondary | ICD-10-CM | POA: Diagnosis not present

## 2020-05-17 DIAGNOSIS — E1165 Type 2 diabetes mellitus with hyperglycemia: Secondary | ICD-10-CM | POA: Diagnosis not present

## 2020-05-17 DIAGNOSIS — I10 Essential (primary) hypertension: Secondary | ICD-10-CM | POA: Diagnosis not present

## 2020-05-17 DIAGNOSIS — E119 Type 2 diabetes mellitus without complications: Secondary | ICD-10-CM | POA: Diagnosis not present

## 2020-05-17 DIAGNOSIS — E669 Obesity, unspecified: Secondary | ICD-10-CM | POA: Diagnosis not present

## 2020-05-17 DIAGNOSIS — Z794 Long term (current) use of insulin: Secondary | ICD-10-CM | POA: Diagnosis not present

## 2020-05-17 DIAGNOSIS — E7800 Pure hypercholesterolemia, unspecified: Secondary | ICD-10-CM | POA: Diagnosis not present

## 2020-05-30 DIAGNOSIS — G8929 Other chronic pain: Secondary | ICD-10-CM | POA: Diagnosis not present

## 2020-05-30 DIAGNOSIS — S63650A Sprain of metacarpophalangeal joint of right index finger, initial encounter: Secondary | ICD-10-CM | POA: Diagnosis not present

## 2020-05-30 DIAGNOSIS — M7662 Achilles tendinitis, left leg: Secondary | ICD-10-CM | POA: Diagnosis not present

## 2020-05-30 DIAGNOSIS — M25572 Pain in left ankle and joints of left foot: Secondary | ICD-10-CM | POA: Diagnosis not present

## 2020-05-30 DIAGNOSIS — M7752 Other enthesopathy of left foot: Secondary | ICD-10-CM | POA: Diagnosis not present

## 2020-05-30 DIAGNOSIS — M79644 Pain in right finger(s): Secondary | ICD-10-CM | POA: Diagnosis not present

## 2020-06-04 DIAGNOSIS — E118 Type 2 diabetes mellitus with unspecified complications: Secondary | ICD-10-CM | POA: Diagnosis not present

## 2020-06-04 DIAGNOSIS — E7800 Pure hypercholesterolemia, unspecified: Secondary | ICD-10-CM | POA: Diagnosis not present

## 2020-06-05 DIAGNOSIS — E1140 Type 2 diabetes mellitus with diabetic neuropathy, unspecified: Secondary | ICD-10-CM | POA: Diagnosis not present

## 2020-06-05 DIAGNOSIS — M7662 Achilles tendinitis, left leg: Secondary | ICD-10-CM | POA: Diagnosis not present

## 2020-06-05 DIAGNOSIS — Z794 Long term (current) use of insulin: Secondary | ICD-10-CM | POA: Diagnosis not present

## 2020-06-06 DIAGNOSIS — M79645 Pain in left finger(s): Secondary | ICD-10-CM | POA: Diagnosis not present

## 2020-06-06 DIAGNOSIS — M79644 Pain in right finger(s): Secondary | ICD-10-CM | POA: Diagnosis not present

## 2020-06-06 DIAGNOSIS — M659 Synovitis and tenosynovitis, unspecified: Secondary | ICD-10-CM | POA: Diagnosis not present

## 2020-06-08 DIAGNOSIS — G2581 Restless legs syndrome: Secondary | ICD-10-CM | POA: Diagnosis not present

## 2020-06-08 DIAGNOSIS — G8929 Other chronic pain: Secondary | ICD-10-CM | POA: Diagnosis not present

## 2020-06-08 DIAGNOSIS — R252 Cramp and spasm: Secondary | ICD-10-CM | POA: Diagnosis not present

## 2020-06-08 DIAGNOSIS — M545 Low back pain, unspecified: Secondary | ICD-10-CM | POA: Diagnosis not present

## 2020-06-08 DIAGNOSIS — R413 Other amnesia: Secondary | ICD-10-CM | POA: Diagnosis not present

## 2020-06-11 DIAGNOSIS — D869 Sarcoidosis, unspecified: Secondary | ICD-10-CM | POA: Diagnosis not present

## 2020-06-11 DIAGNOSIS — E118 Type 2 diabetes mellitus with unspecified complications: Secondary | ICD-10-CM | POA: Diagnosis not present

## 2020-06-11 DIAGNOSIS — Z0000 Encounter for general adult medical examination without abnormal findings: Secondary | ICD-10-CM | POA: Diagnosis not present

## 2020-06-11 DIAGNOSIS — E7800 Pure hypercholesterolemia, unspecified: Secondary | ICD-10-CM | POA: Diagnosis not present

## 2020-06-12 DIAGNOSIS — M4606 Spinal enthesopathy, lumbar region: Secondary | ICD-10-CM | POA: Diagnosis not present

## 2020-06-12 DIAGNOSIS — M5416 Radiculopathy, lumbar region: Secondary | ICD-10-CM | POA: Diagnosis not present

## 2020-06-12 DIAGNOSIS — M2578 Osteophyte, vertebrae: Secondary | ICD-10-CM | POA: Diagnosis not present

## 2020-06-12 DIAGNOSIS — M5136 Other intervertebral disc degeneration, lumbar region: Secondary | ICD-10-CM | POA: Diagnosis not present

## 2020-06-12 DIAGNOSIS — M48062 Spinal stenosis, lumbar region with neurogenic claudication: Secondary | ICD-10-CM | POA: Diagnosis not present

## 2020-06-12 DIAGNOSIS — M47816 Spondylosis without myelopathy or radiculopathy, lumbar region: Secondary | ICD-10-CM | POA: Diagnosis not present

## 2020-06-12 DIAGNOSIS — M439 Deforming dorsopathy, unspecified: Secondary | ICD-10-CM | POA: Diagnosis not present

## 2020-06-12 DIAGNOSIS — M62830 Muscle spasm of back: Secondary | ICD-10-CM | POA: Diagnosis not present

## 2020-06-15 ENCOUNTER — Other Ambulatory Visit: Payer: Self-pay | Admitting: Physical Medicine and Rehabilitation

## 2020-06-15 DIAGNOSIS — M5416 Radiculopathy, lumbar region: Secondary | ICD-10-CM

## 2020-06-21 ENCOUNTER — Ambulatory Visit
Admission: RE | Admit: 2020-06-21 | Discharge: 2020-06-21 | Disposition: A | Discharge status: Home / Self Care | Payer: Medicare HMO | Source: Ambulatory Visit | Attending: Physical Medicine and Rehabilitation | Admitting: Physical Medicine and Rehabilitation

## 2020-06-21 ENCOUNTER — Other Ambulatory Visit: Payer: Self-pay

## 2020-06-21 DIAGNOSIS — M545 Low back pain, unspecified: Secondary | ICD-10-CM | POA: Diagnosis not present

## 2020-06-21 DIAGNOSIS — M5416 Radiculopathy, lumbar region: Secondary | ICD-10-CM | POA: Diagnosis not present

## 2020-06-26 ENCOUNTER — Ambulatory Visit: Payer: Medicare HMO

## 2020-06-26 DIAGNOSIS — M48062 Spinal stenosis, lumbar region with neurogenic claudication: Secondary | ICD-10-CM | POA: Diagnosis not present

## 2020-06-26 DIAGNOSIS — M5136 Other intervertebral disc degeneration, lumbar region: Secondary | ICD-10-CM | POA: Diagnosis not present

## 2020-06-26 DIAGNOSIS — M5126 Other intervertebral disc displacement, lumbar region: Secondary | ICD-10-CM | POA: Diagnosis not present

## 2020-06-26 DIAGNOSIS — M5416 Radiculopathy, lumbar region: Secondary | ICD-10-CM | POA: Diagnosis not present

## 2020-07-02 DIAGNOSIS — D860 Sarcoidosis of lung: Secondary | ICD-10-CM | POA: Diagnosis not present

## 2020-07-04 DIAGNOSIS — M48062 Spinal stenosis, lumbar region with neurogenic claudication: Secondary | ICD-10-CM | POA: Diagnosis not present

## 2020-07-04 DIAGNOSIS — M5416 Radiculopathy, lumbar region: Secondary | ICD-10-CM | POA: Diagnosis not present

## 2020-07-25 DIAGNOSIS — M5126 Other intervertebral disc displacement, lumbar region: Secondary | ICD-10-CM | POA: Diagnosis not present

## 2020-07-25 DIAGNOSIS — M48062 Spinal stenosis, lumbar region with neurogenic claudication: Secondary | ICD-10-CM | POA: Diagnosis not present

## 2020-07-25 DIAGNOSIS — M5136 Other intervertebral disc degeneration, lumbar region: Secondary | ICD-10-CM | POA: Diagnosis not present

## 2020-07-25 DIAGNOSIS — M5416 Radiculopathy, lumbar region: Secondary | ICD-10-CM | POA: Diagnosis not present

## 2020-08-03 DIAGNOSIS — M47816 Spondylosis without myelopathy or radiculopathy, lumbar region: Secondary | ICD-10-CM | POA: Diagnosis not present

## 2020-08-16 DIAGNOSIS — M545 Low back pain, unspecified: Secondary | ICD-10-CM | POA: Diagnosis not present

## 2020-08-16 DIAGNOSIS — M47816 Spondylosis without myelopathy or radiculopathy, lumbar region: Secondary | ICD-10-CM | POA: Diagnosis not present

## 2020-08-16 DIAGNOSIS — G8929 Other chronic pain: Secondary | ICD-10-CM | POA: Diagnosis not present

## 2020-08-23 DIAGNOSIS — E669 Obesity, unspecified: Secondary | ICD-10-CM | POA: Diagnosis not present

## 2020-08-23 DIAGNOSIS — E7800 Pure hypercholesterolemia, unspecified: Secondary | ICD-10-CM | POA: Diagnosis not present

## 2020-08-23 DIAGNOSIS — I10 Essential (primary) hypertension: Secondary | ICD-10-CM | POA: Diagnosis not present

## 2020-08-23 DIAGNOSIS — E1142 Type 2 diabetes mellitus with diabetic polyneuropathy: Secondary | ICD-10-CM | POA: Diagnosis not present

## 2020-08-23 DIAGNOSIS — E1165 Type 2 diabetes mellitus with hyperglycemia: Secondary | ICD-10-CM | POA: Diagnosis not present

## 2020-08-23 DIAGNOSIS — Z794 Long term (current) use of insulin: Secondary | ICD-10-CM | POA: Diagnosis not present

## 2020-08-24 DIAGNOSIS — M5136 Other intervertebral disc degeneration, lumbar region: Secondary | ICD-10-CM | POA: Diagnosis not present

## 2020-08-24 DIAGNOSIS — M5126 Other intervertebral disc displacement, lumbar region: Secondary | ICD-10-CM | POA: Diagnosis not present

## 2020-08-24 DIAGNOSIS — M5416 Radiculopathy, lumbar region: Secondary | ICD-10-CM | POA: Diagnosis not present

## 2020-08-24 DIAGNOSIS — M47816 Spondylosis without myelopathy or radiculopathy, lumbar region: Secondary | ICD-10-CM | POA: Diagnosis not present

## 2020-08-24 DIAGNOSIS — M48062 Spinal stenosis, lumbar region with neurogenic claudication: Secondary | ICD-10-CM | POA: Diagnosis not present

## 2020-08-27 DIAGNOSIS — M545 Low back pain, unspecified: Secondary | ICD-10-CM | POA: Diagnosis not present

## 2020-08-27 DIAGNOSIS — G8929 Other chronic pain: Secondary | ICD-10-CM | POA: Diagnosis not present

## 2020-09-04 DIAGNOSIS — M545 Low back pain, unspecified: Secondary | ICD-10-CM | POA: Diagnosis not present

## 2020-09-04 DIAGNOSIS — G8929 Other chronic pain: Secondary | ICD-10-CM | POA: Diagnosis not present

## 2020-09-13 DIAGNOSIS — M545 Low back pain, unspecified: Secondary | ICD-10-CM | POA: Diagnosis not present

## 2020-09-13 DIAGNOSIS — G8929 Other chronic pain: Secondary | ICD-10-CM | POA: Diagnosis not present

## 2020-09-17 DIAGNOSIS — Z794 Long term (current) use of insulin: Secondary | ICD-10-CM | POA: Diagnosis not present

## 2020-09-17 DIAGNOSIS — B351 Tinea unguium: Secondary | ICD-10-CM | POA: Diagnosis not present

## 2020-09-17 DIAGNOSIS — L600 Ingrowing nail: Secondary | ICD-10-CM | POA: Diagnosis not present

## 2020-09-17 DIAGNOSIS — E1140 Type 2 diabetes mellitus with diabetic neuropathy, unspecified: Secondary | ICD-10-CM | POA: Diagnosis not present

## 2020-09-20 DIAGNOSIS — M545 Low back pain, unspecified: Secondary | ICD-10-CM | POA: Diagnosis not present

## 2020-09-20 DIAGNOSIS — G8929 Other chronic pain: Secondary | ICD-10-CM | POA: Diagnosis not present

## 2020-10-02 DIAGNOSIS — E7800 Pure hypercholesterolemia, unspecified: Secondary | ICD-10-CM | POA: Diagnosis not present

## 2020-10-02 DIAGNOSIS — E118 Type 2 diabetes mellitus with unspecified complications: Secondary | ICD-10-CM | POA: Diagnosis not present

## 2020-10-09 DIAGNOSIS — R079 Chest pain, unspecified: Secondary | ICD-10-CM | POA: Diagnosis not present

## 2020-10-09 DIAGNOSIS — F325 Major depressive disorder, single episode, in full remission: Secondary | ICD-10-CM | POA: Diagnosis not present

## 2020-10-09 DIAGNOSIS — R1084 Generalized abdominal pain: Secondary | ICD-10-CM | POA: Diagnosis not present

## 2020-10-09 DIAGNOSIS — I10 Essential (primary) hypertension: Secondary | ICD-10-CM | POA: Diagnosis not present

## 2020-10-09 DIAGNOSIS — E7800 Pure hypercholesterolemia, unspecified: Secondary | ICD-10-CM | POA: Diagnosis not present

## 2020-10-09 DIAGNOSIS — E119 Type 2 diabetes mellitus without complications: Secondary | ICD-10-CM | POA: Diagnosis not present

## 2020-10-26 DIAGNOSIS — E119 Type 2 diabetes mellitus without complications: Secondary | ICD-10-CM | POA: Diagnosis not present

## 2020-11-15 DIAGNOSIS — M778 Other enthesopathies, not elsewhere classified: Secondary | ICD-10-CM | POA: Diagnosis not present

## 2020-11-15 DIAGNOSIS — M19132 Post-traumatic osteoarthritis, left wrist: Secondary | ICD-10-CM | POA: Diagnosis not present

## 2020-12-04 DIAGNOSIS — Z794 Long term (current) use of insulin: Secondary | ICD-10-CM | POA: Diagnosis not present

## 2020-12-04 DIAGNOSIS — E1140 Type 2 diabetes mellitus with diabetic neuropathy, unspecified: Secondary | ICD-10-CM | POA: Diagnosis not present

## 2020-12-04 DIAGNOSIS — M7662 Achilles tendinitis, left leg: Secondary | ICD-10-CM | POA: Diagnosis not present

## 2020-12-13 DIAGNOSIS — R413 Other amnesia: Secondary | ICD-10-CM | POA: Diagnosis not present

## 2020-12-13 DIAGNOSIS — R252 Cramp and spasm: Secondary | ICD-10-CM | POA: Diagnosis not present

## 2020-12-13 DIAGNOSIS — G2581 Restless legs syndrome: Secondary | ICD-10-CM | POA: Diagnosis not present

## 2021-01-02 DIAGNOSIS — M7662 Achilles tendinitis, left leg: Secondary | ICD-10-CM | POA: Diagnosis not present

## 2021-01-02 DIAGNOSIS — G8929 Other chronic pain: Secondary | ICD-10-CM | POA: Diagnosis not present

## 2021-01-02 DIAGNOSIS — M25572 Pain in left ankle and joints of left foot: Secondary | ICD-10-CM | POA: Diagnosis not present

## 2021-01-07 DIAGNOSIS — E1165 Type 2 diabetes mellitus with hyperglycemia: Secondary | ICD-10-CM | POA: Diagnosis not present

## 2021-01-07 DIAGNOSIS — M7662 Achilles tendinitis, left leg: Secondary | ICD-10-CM | POA: Diagnosis not present

## 2021-01-07 DIAGNOSIS — N1831 Chronic kidney disease, stage 3a: Secondary | ICD-10-CM | POA: Diagnosis not present

## 2021-01-07 DIAGNOSIS — E1122 Type 2 diabetes mellitus with diabetic chronic kidney disease: Secondary | ICD-10-CM | POA: Diagnosis not present

## 2021-01-07 DIAGNOSIS — Z794 Long term (current) use of insulin: Secondary | ICD-10-CM | POA: Diagnosis not present

## 2021-01-10 DIAGNOSIS — M7662 Achilles tendinitis, left leg: Secondary | ICD-10-CM | POA: Diagnosis not present

## 2021-01-15 DIAGNOSIS — M7662 Achilles tendinitis, left leg: Secondary | ICD-10-CM | POA: Diagnosis not present

## 2021-01-17 DIAGNOSIS — M7662 Achilles tendinitis, left leg: Secondary | ICD-10-CM | POA: Diagnosis not present

## 2021-01-22 DIAGNOSIS — M7662 Achilles tendinitis, left leg: Secondary | ICD-10-CM | POA: Diagnosis not present

## 2021-01-24 DIAGNOSIS — M7662 Achilles tendinitis, left leg: Secondary | ICD-10-CM | POA: Diagnosis not present

## 2021-01-31 DIAGNOSIS — F325 Major depressive disorder, single episode, in full remission: Secondary | ICD-10-CM | POA: Diagnosis not present

## 2021-01-31 DIAGNOSIS — E7800 Pure hypercholesterolemia, unspecified: Secondary | ICD-10-CM | POA: Diagnosis not present

## 2021-01-31 DIAGNOSIS — E118 Type 2 diabetes mellitus with unspecified complications: Secondary | ICD-10-CM | POA: Diagnosis not present

## 2021-01-31 DIAGNOSIS — I10 Essential (primary) hypertension: Secondary | ICD-10-CM | POA: Diagnosis not present

## 2021-02-05 DIAGNOSIS — J849 Interstitial pulmonary disease, unspecified: Secondary | ICD-10-CM | POA: Diagnosis not present

## 2021-02-05 DIAGNOSIS — D869 Sarcoidosis, unspecified: Secondary | ICD-10-CM | POA: Diagnosis not present

## 2021-02-05 DIAGNOSIS — Z86718 Personal history of other venous thrombosis and embolism: Secondary | ICD-10-CM | POA: Diagnosis not present

## 2021-02-05 DIAGNOSIS — M7662 Achilles tendinitis, left leg: Secondary | ICD-10-CM | POA: Diagnosis not present

## 2021-02-05 DIAGNOSIS — E1140 Type 2 diabetes mellitus with diabetic neuropathy, unspecified: Secondary | ICD-10-CM | POA: Diagnosis not present

## 2021-02-05 DIAGNOSIS — Z01818 Encounter for other preprocedural examination: Secondary | ICD-10-CM | POA: Diagnosis not present

## 2021-02-05 DIAGNOSIS — Z794 Long term (current) use of insulin: Secondary | ICD-10-CM | POA: Diagnosis not present

## 2021-02-06 ENCOUNTER — Other Ambulatory Visit: Payer: Self-pay | Admitting: Pulmonary Disease

## 2021-02-06 DIAGNOSIS — J849 Interstitial pulmonary disease, unspecified: Secondary | ICD-10-CM

## 2021-02-07 DIAGNOSIS — I10 Essential (primary) hypertension: Secondary | ICD-10-CM | POA: Diagnosis not present

## 2021-02-07 DIAGNOSIS — F325 Major depressive disorder, single episode, in full remission: Secondary | ICD-10-CM | POA: Diagnosis not present

## 2021-02-07 DIAGNOSIS — E7800 Pure hypercholesterolemia, unspecified: Secondary | ICD-10-CM | POA: Diagnosis not present

## 2021-02-07 DIAGNOSIS — D869 Sarcoidosis, unspecified: Secondary | ICD-10-CM | POA: Diagnosis not present

## 2021-02-15 ENCOUNTER — Ambulatory Visit
Admission: RE | Admit: 2021-02-15 | Discharge: 2021-02-15 | Disposition: A | Discharge status: Home / Self Care | Payer: Medicare HMO | Source: Ambulatory Visit | Attending: Pulmonary Disease | Admitting: Pulmonary Disease

## 2021-02-15 ENCOUNTER — Other Ambulatory Visit: Payer: Self-pay

## 2021-02-15 DIAGNOSIS — I700 Atherosclerosis of aorta: Secondary | ICD-10-CM | POA: Diagnosis not present

## 2021-02-15 DIAGNOSIS — J849 Interstitial pulmonary disease, unspecified: Secondary | ICD-10-CM | POA: Insufficient documentation

## 2021-02-15 MED ORDER — IOHEXOL 300 MG/ML  SOLN
75.0000 mL | Freq: Once | INTRAMUSCULAR | Status: AC | PRN
  Administered 2021-02-15: 75 mL via INTRAVENOUS

## 2021-03-04 ENCOUNTER — Ambulatory Visit: Payer: Medicare HMO | Admitting: Dermatology

## 2021-03-04 ENCOUNTER — Other Ambulatory Visit: Payer: Self-pay

## 2021-03-04 DIAGNOSIS — L817 Pigmented purpuric dermatosis: Secondary | ICD-10-CM

## 2021-03-04 DIAGNOSIS — L821 Other seborrheic keratosis: Secondary | ICD-10-CM

## 2021-03-04 DIAGNOSIS — D229 Melanocytic nevi, unspecified: Secondary | ICD-10-CM

## 2021-03-04 DIAGNOSIS — D1801 Hemangioma of skin and subcutaneous tissue: Secondary | ICD-10-CM

## 2021-03-04 DIAGNOSIS — I872 Venous insufficiency (chronic) (peripheral): Secondary | ICD-10-CM

## 2021-03-04 DIAGNOSIS — Z1283 Encounter for screening for malignant neoplasm of skin: Secondary | ICD-10-CM

## 2021-03-04 DIAGNOSIS — L219 Seborrheic dermatitis, unspecified: Secondary | ICD-10-CM

## 2021-03-04 DIAGNOSIS — L578 Other skin changes due to chronic exposure to nonionizing radiation: Secondary | ICD-10-CM

## 2021-03-04 DIAGNOSIS — L570 Actinic keratosis: Secondary | ICD-10-CM | POA: Diagnosis not present

## 2021-03-04 DIAGNOSIS — L814 Other melanin hyperpigmentation: Secondary | ICD-10-CM

## 2021-03-04 DIAGNOSIS — L820 Inflamed seborrheic keratosis: Secondary | ICD-10-CM

## 2021-03-04 DIAGNOSIS — L719 Rosacea, unspecified: Secondary | ICD-10-CM | POA: Diagnosis not present

## 2021-03-04 MED ORDER — KETOCONAZOLE 2 % EX SHAM: 1.0000 "application " | MEDICATED_SHAMPOO | CUTANEOUS | 4 refills | Status: DC

## 2021-03-04 NOTE — Progress Notes (Signed)
Follow-Up Visit   Subjective  George Daniels is a 75 y.o. male who presents for the following: Annual Exam (Patient has noticed red patches on his cheeks that don't resolve, thin skin on the ears that occasionally bleeds, a nodule on his L cheek that he would like checked, and his scalp checked due to crusty lesions. He is currently using Ketoconazole 2% shampoo on the scalp 3d/wk but still has crusty areas and he is concerned the medication may no longer be working. ). The patient presents for Total-Body Skin Exam (TBSE) for skin cancer screening and mole check. The patient has spots, moles and lesions to be evaluated, some may be new or changing and the patient has concerns that these could be cancer.  The following portions of the chart were reviewed this encounter and updated as appropriate:   Tobacco  Allergies  Meds  Problems  Med Hx  Surg Hx  Fam Hx     Review of Systems:  No other skin or systemic complaints except as noted in HPI or Assessment and Plan.  Objective  Well appearing patient in no apparent distress; mood and affect are within normal limits.  A full examination was performed including scalp, head, eyes, ears, nose, lips, neck, chest, axillae, abdomen, back, buttocks, bilateral upper extremities, bilateral lower extremities, hands, feet, fingers, toes, fingernails, and toenails. All findings within normal limits unless otherwise noted below.  Scalp Pink patches with greasy scale.   Face Erythema of the cheeks, chin, and nose.   Face and ears x 6 (6) Erythematous thin papules/macules with gritty scale.   Back x 4, R thigh x 3, scalp x 2, L cheek x 1 (10) Erythematous keratotic or waxy stuck-on papule or plaque.    Assessment & Plan  Seborrheic dermatitis Scalp  Seborrheic Dermatitis - improved on Ketoconazole 2% shampoo.  -  is a chronic persistent rash characterized by pinkness and scaling most commonly of the mid face but also can occur on the scalp  (dandruff), ears; mid chest, mid back and groin.  It tends to be exacerbated by stress and cooler weather.  People who have neurologic disease may experience new onset or exacerbation of existing seborrheic dermatitis.  The condition is not curable but treatable and can be controlled.  Continue Ketoconazole 2% shampoo 3d/week. Let sit 5-10 minutes before washing out.   Related Medications ketoconazole (NIZORAL) 2 % shampoo Apply 1 application topically 3 (three) times a week.  Rosacea Face Rosacea is a chronic progressive skin condition usually affecting the face of adults, causing redness and/or acne bumps. It is treatable but not curable. It sometimes affects the eyes (ocular rosacea) as well. It may respond to topical and/or systemic medication and can flare with stress, sun exposure, alcohol, exercise and some foods.  Daily application of broad spectrum spf 30+ sunscreen to face is recommended to reduce flares. Discussed the treatment option of BBL/laser.  Typically we recommend 1-3 treatment sessions about 5-8 weeks apart for best results.  The patient's condition may require "maintenance treatments" in the future.  The fee for BBL / laser treatments is $350 per treatment session for the whole face.  A fee can be quoted for other parts of the body.  Insurance typically does not pay for BBL/laser treatments and therefore it is an out-of-pocket cost.  AK (actinic keratosis) (6) Face and ears x 6 Destruction of lesion - Face and ears x 6 Complexity: simple   Destruction method: cryotherapy   Informed  consent: discussed and consent obtained   Timeout:  patient name, date of birth, surgical site, and procedure verified Lesion destroyed using liquid nitrogen: Yes   Region frozen until ice ball extended beyond lesion: Yes   Outcome: patient tolerated procedure well with no complications   Post-procedure details: wound care instructions given    Inflamed seborrheic keratosis x 10 Back x 4, R  thigh x 3, scalp x 2, L cheek x 1 Destruction of lesion - Back x 4, R thigh x 3, scalp x 2, L cheek x 1 Complexity: simple   Destruction method: cryotherapy   Informed consent: discussed and consent obtained   Timeout:  patient name, date of birth, surgical site, and procedure verified Lesion destroyed using liquid nitrogen: Yes   Region frozen until ice ball extended beyond lesion: Yes   Outcome: patient tolerated procedure well with no complications   Post-procedure details: wound care instructions given    Stasis dermatitis of both legs B/L leg With schamberg's purpura -  Benign-appearing.  Observation.  Call clinic for new or changing lesions.  Recommend daily use of broad spectrum spf 30+ sunscreen to sun-exposed areas.   Skin cancer screening  Lentigines - Scattered tan macules - Due to sun exposure - Benign-appearing, observe - Recommend daily broad spectrum sunscreen SPF 30+ to sun-exposed areas, reapply every 2 hours as needed. - Call for any changes  Seborrheic Keratoses - Stuck-on, waxy, tan-brown papules and/or plaques  - Benign-appearing - Discussed benign etiology and prognosis. - Observe - Call for any changes  Melanocytic Nevi - Tan-brown and/or pink-flesh-colored symmetric macules and papules - Benign appearing on exam today - Observation - Call clinic for new or changing moles - Recommend daily use of broad spectrum spf 30+ sunscreen to sun-exposed areas.   Hemangiomas - Red papules - Discussed benign nature - Observe - Call for any changes  Actinic Damage - Chronic condition, secondary to cumulative UV/sun exposure - diffuse scaly erythematous macules with underlying dyspigmentation - Recommend daily broad spectrum sunscreen SPF 30+ to sun-exposed areas, reapply every 2 hours as needed.  - Staying in the shade or wearing long sleeves, sun glasses (UVA+UVB protection) and wide brim hats (4-inch brim around the entire circumference of the hat) are  also recommended for sun protection.  - Call for new or changing lesions.  Skin cancer screening performed today.  Return in about 1 year (around 03/04/2022).  Luther Redo, CMA, am acting as scribe for Sarina Ser, MD . Documentation: I have reviewed the above documentation for accuracy and completeness, and I agree with the above.  Sarina Ser, MD

## 2021-03-04 NOTE — Patient Instructions (Signed)

## 2021-03-13 ENCOUNTER — Encounter: Payer: Self-pay | Admitting: Dermatology

## 2021-03-17 ENCOUNTER — Encounter: Payer: Self-pay | Admitting: Dermatology

## 2021-04-10 DIAGNOSIS — Z794 Long term (current) use of insulin: Secondary | ICD-10-CM | POA: Diagnosis not present

## 2021-04-10 DIAGNOSIS — E1165 Type 2 diabetes mellitus with hyperglycemia: Secondary | ICD-10-CM | POA: Diagnosis not present

## 2021-05-10 DIAGNOSIS — E1165 Type 2 diabetes mellitus with hyperglycemia: Secondary | ICD-10-CM | POA: Diagnosis not present

## 2021-05-10 DIAGNOSIS — Z794 Long term (current) use of insulin: Secondary | ICD-10-CM | POA: Diagnosis not present

## 2021-05-10 DIAGNOSIS — I10 Essential (primary) hypertension: Secondary | ICD-10-CM | POA: Diagnosis not present

## 2021-06-12 DIAGNOSIS — E118 Type 2 diabetes mellitus with unspecified complications: Secondary | ICD-10-CM | POA: Diagnosis not present

## 2021-06-12 DIAGNOSIS — F325 Major depressive disorder, single episode, in full remission: Secondary | ICD-10-CM | POA: Diagnosis not present

## 2021-06-12 DIAGNOSIS — Z0000 Encounter for general adult medical examination without abnormal findings: Secondary | ICD-10-CM | POA: Diagnosis not present

## 2021-06-12 DIAGNOSIS — E7800 Pure hypercholesterolemia, unspecified: Secondary | ICD-10-CM | POA: Diagnosis not present

## 2021-06-12 DIAGNOSIS — I10 Essential (primary) hypertension: Secondary | ICD-10-CM | POA: Diagnosis not present

## 2021-06-24 DIAGNOSIS — R0781 Pleurodynia: Secondary | ICD-10-CM | POA: Diagnosis not present

## 2021-06-24 DIAGNOSIS — M5414 Radiculopathy, thoracic region: Secondary | ICD-10-CM | POA: Diagnosis not present

## 2021-06-25 ENCOUNTER — Other Ambulatory Visit: Payer: Self-pay | Admitting: Student

## 2021-06-25 ENCOUNTER — Other Ambulatory Visit (HOSPITAL_COMMUNITY): Payer: Self-pay | Admitting: Student

## 2021-06-25 DIAGNOSIS — M5414 Radiculopathy, thoracic region: Secondary | ICD-10-CM

## 2021-06-25 DIAGNOSIS — R0781 Pleurodynia: Secondary | ICD-10-CM

## 2021-06-25 DIAGNOSIS — R0789 Other chest pain: Secondary | ICD-10-CM

## 2021-07-01 DIAGNOSIS — R252 Cramp and spasm: Secondary | ICD-10-CM | POA: Diagnosis not present

## 2021-07-01 DIAGNOSIS — G2581 Restless legs syndrome: Secondary | ICD-10-CM | POA: Diagnosis not present

## 2021-07-01 DIAGNOSIS — G8929 Other chronic pain: Secondary | ICD-10-CM | POA: Diagnosis not present

## 2021-07-01 DIAGNOSIS — R413 Other amnesia: Secondary | ICD-10-CM | POA: Diagnosis not present

## 2021-07-01 DIAGNOSIS — M545 Low back pain, unspecified: Secondary | ICD-10-CM | POA: Diagnosis not present

## 2021-07-08 ENCOUNTER — Ambulatory Visit
Admission: RE | Admit: 2021-07-08 | Discharge: 2021-07-08 | Disposition: A | Discharge status: Home / Self Care | Payer: Medicare HMO | Source: Ambulatory Visit | Attending: Student | Admitting: Student

## 2021-07-08 DIAGNOSIS — R0781 Pleurodynia: Secondary | ICD-10-CM | POA: Insufficient documentation

## 2021-07-08 DIAGNOSIS — M5414 Radiculopathy, thoracic region: Secondary | ICD-10-CM | POA: Insufficient documentation

## 2021-07-08 DIAGNOSIS — M546 Pain in thoracic spine: Secondary | ICD-10-CM | POA: Diagnosis not present

## 2021-09-05 DIAGNOSIS — E1165 Type 2 diabetes mellitus with hyperglycemia: Secondary | ICD-10-CM | POA: Diagnosis not present

## 2021-09-05 DIAGNOSIS — E7800 Pure hypercholesterolemia, unspecified: Secondary | ICD-10-CM | POA: Diagnosis not present

## 2021-09-05 DIAGNOSIS — I10 Essential (primary) hypertension: Secondary | ICD-10-CM | POA: Diagnosis not present

## 2021-09-05 DIAGNOSIS — Z794 Long term (current) use of insulin: Secondary | ICD-10-CM | POA: Diagnosis not present

## 2021-09-12 DIAGNOSIS — I10 Essential (primary) hypertension: Secondary | ICD-10-CM | POA: Diagnosis not present

## 2021-09-12 DIAGNOSIS — Z794 Long term (current) use of insulin: Secondary | ICD-10-CM | POA: Diagnosis not present

## 2021-09-12 DIAGNOSIS — E1165 Type 2 diabetes mellitus with hyperglycemia: Secondary | ICD-10-CM | POA: Diagnosis not present

## 2021-10-28 DIAGNOSIS — E119 Type 2 diabetes mellitus without complications: Secondary | ICD-10-CM | POA: Diagnosis not present

## 2021-11-07 DIAGNOSIS — Z794 Long term (current) use of insulin: Secondary | ICD-10-CM | POA: Diagnosis not present

## 2021-11-07 DIAGNOSIS — Z86718 Personal history of other venous thrombosis and embolism: Secondary | ICD-10-CM | POA: Diagnosis not present

## 2021-11-07 DIAGNOSIS — M722 Plantar fascial fibromatosis: Secondary | ICD-10-CM | POA: Diagnosis not present

## 2021-11-07 DIAGNOSIS — E1140 Type 2 diabetes mellitus with diabetic neuropathy, unspecified: Secondary | ICD-10-CM | POA: Diagnosis not present

## 2021-11-07 DIAGNOSIS — M79671 Pain in right foot: Secondary | ICD-10-CM | POA: Diagnosis not present

## 2021-11-21 DIAGNOSIS — D869 Sarcoidosis, unspecified: Secondary | ICD-10-CM | POA: Diagnosis not present

## 2021-11-21 DIAGNOSIS — J849 Interstitial pulmonary disease, unspecified: Secondary | ICD-10-CM | POA: Diagnosis not present

## 2021-12-18 ENCOUNTER — Other Ambulatory Visit
Admission: RE | Admit: 2021-12-18 | Discharge: 2021-12-18 | Disposition: A | Discharge status: Home / Self Care | Payer: Medicare HMO | Source: Ambulatory Visit | Attending: Internal Medicine | Admitting: Internal Medicine

## 2021-12-18 DIAGNOSIS — F325 Major depressive disorder, single episode, in full remission: Secondary | ICD-10-CM | POA: Diagnosis not present

## 2021-12-18 DIAGNOSIS — R600 Localized edema: Secondary | ICD-10-CM | POA: Diagnosis not present

## 2021-12-18 DIAGNOSIS — I10 Essential (primary) hypertension: Secondary | ICD-10-CM | POA: Insufficient documentation

## 2021-12-18 DIAGNOSIS — E118 Type 2 diabetes mellitus with unspecified complications: Secondary | ICD-10-CM | POA: Insufficient documentation

## 2021-12-18 DIAGNOSIS — I700 Atherosclerosis of aorta: Secondary | ICD-10-CM | POA: Diagnosis not present

## 2021-12-18 LAB — D-DIMER, QUANTITATIVE: D-Dimer, Quant: 0.98 ug/mL-FEU — ABNORMAL HIGH (ref 0.00–0.50)

## 2021-12-19 ENCOUNTER — Ambulatory Visit
Admission: RE | Admit: 2021-12-19 | Discharge: 2021-12-19 | Disposition: A | Discharge status: Home / Self Care | Payer: Medicare HMO | Source: Ambulatory Visit | Attending: Internal Medicine | Admitting: Internal Medicine

## 2021-12-19 ENCOUNTER — Other Ambulatory Visit: Payer: Self-pay | Admitting: Internal Medicine

## 2021-12-19 DIAGNOSIS — R7989 Other specified abnormal findings of blood chemistry: Secondary | ICD-10-CM | POA: Insufficient documentation

## 2021-12-19 DIAGNOSIS — R600 Localized edema: Secondary | ICD-10-CM

## 2021-12-19 DIAGNOSIS — I82431 Acute embolism and thrombosis of right popliteal vein: Secondary | ICD-10-CM | POA: Diagnosis not present

## 2021-12-19 HISTORY — DX: Acute embolism and thrombosis of right popliteal vein: I82.431

## 2022-01-01 DIAGNOSIS — I700 Atherosclerosis of aorta: Secondary | ICD-10-CM | POA: Diagnosis not present

## 2022-01-01 DIAGNOSIS — E7800 Pure hypercholesterolemia, unspecified: Secondary | ICD-10-CM | POA: Diagnosis not present

## 2022-01-01 DIAGNOSIS — E118 Type 2 diabetes mellitus with unspecified complications: Secondary | ICD-10-CM | POA: Diagnosis not present

## 2022-01-01 DIAGNOSIS — I10 Essential (primary) hypertension: Secondary | ICD-10-CM | POA: Diagnosis not present

## 2022-01-01 DIAGNOSIS — F325 Major depressive disorder, single episode, in full remission: Secondary | ICD-10-CM | POA: Diagnosis not present

## 2022-01-23 DIAGNOSIS — E1165 Type 2 diabetes mellitus with hyperglycemia: Secondary | ICD-10-CM | POA: Diagnosis not present

## 2022-01-23 DIAGNOSIS — Z794 Long term (current) use of insulin: Secondary | ICD-10-CM | POA: Diagnosis not present

## 2022-01-23 DIAGNOSIS — R600 Localized edema: Secondary | ICD-10-CM | POA: Diagnosis not present

## 2022-01-23 DIAGNOSIS — I10 Essential (primary) hypertension: Secondary | ICD-10-CM | POA: Diagnosis not present

## 2022-01-23 DIAGNOSIS — E118 Type 2 diabetes mellitus with unspecified complications: Secondary | ICD-10-CM | POA: Diagnosis not present

## 2022-01-27 DIAGNOSIS — Z9889 Other specified postprocedural states: Secondary | ICD-10-CM | POA: Diagnosis not present

## 2022-01-27 DIAGNOSIS — M25511 Pain in right shoulder: Secondary | ICD-10-CM | POA: Diagnosis not present

## 2022-01-27 DIAGNOSIS — G8929 Other chronic pain: Secondary | ICD-10-CM | POA: Diagnosis not present

## 2022-01-27 DIAGNOSIS — M7581 Other shoulder lesions, right shoulder: Secondary | ICD-10-CM | POA: Diagnosis not present

## 2022-02-05 DIAGNOSIS — D869 Sarcoidosis, unspecified: Secondary | ICD-10-CM | POA: Diagnosis not present

## 2022-03-10 ENCOUNTER — Ambulatory Visit: Payer: Medicare HMO | Admitting: Dermatology

## 2022-03-15 ENCOUNTER — Other Ambulatory Visit: Payer: Self-pay | Admitting: Dermatology

## 2022-03-15 DIAGNOSIS — L219 Seborrheic dermatitis, unspecified: Secondary | ICD-10-CM

## 2022-04-28 ENCOUNTER — Ambulatory Visit: Payer: Medicare HMO | Admitting: Dermatology

## 2022-04-28 VITALS — BP 108/66 | HR 98

## 2022-04-28 DIAGNOSIS — Z7189 Other specified counseling: Secondary | ICD-10-CM

## 2022-04-28 DIAGNOSIS — I872 Venous insufficiency (chronic) (peripheral): Secondary | ICD-10-CM

## 2022-04-28 DIAGNOSIS — L219 Seborrheic dermatitis, unspecified: Secondary | ICD-10-CM

## 2022-04-28 DIAGNOSIS — L814 Other melanin hyperpigmentation: Secondary | ICD-10-CM | POA: Diagnosis not present

## 2022-04-28 DIAGNOSIS — L578 Other skin changes due to chronic exposure to nonionizing radiation: Secondary | ICD-10-CM

## 2022-04-28 DIAGNOSIS — L821 Other seborrheic keratosis: Secondary | ICD-10-CM | POA: Diagnosis not present

## 2022-04-28 DIAGNOSIS — L817 Pigmented purpuric dermatosis: Secondary | ICD-10-CM | POA: Diagnosis not present

## 2022-04-28 DIAGNOSIS — L820 Inflamed seborrheic keratosis: Secondary | ICD-10-CM | POA: Diagnosis not present

## 2022-04-28 DIAGNOSIS — L720 Epidermal cyst: Secondary | ICD-10-CM | POA: Diagnosis not present

## 2022-04-28 DIAGNOSIS — Z79899 Other long term (current) drug therapy: Secondary | ICD-10-CM

## 2022-04-28 DIAGNOSIS — L570 Actinic keratosis: Secondary | ICD-10-CM | POA: Diagnosis not present

## 2022-04-28 DIAGNOSIS — D229 Melanocytic nevi, unspecified: Secondary | ICD-10-CM

## 2022-04-28 DIAGNOSIS — L719 Rosacea, unspecified: Secondary | ICD-10-CM | POA: Diagnosis not present

## 2022-04-28 DIAGNOSIS — Z1283 Encounter for screening for malignant neoplasm of skin: Secondary | ICD-10-CM

## 2022-04-28 MED ORDER — KETOCONAZOLE 2 % EX SHAM: MEDICATED_SHAMPOO | CUTANEOUS | 5 refills | Status: DC

## 2022-04-28 NOTE — Progress Notes (Signed)
Follow-Up Visit   Subjective  George Daniels is a 77 y.o. male who presents for the following: Annual Exam (Hx Aks, ISKs, rosacea, and seborrheic dermatitis. Pt currently using Ketoconazole 2% shampoo for SD). The patient presents for Total-Body Skin Exam (TBSE) for skin cancer screening and mole check.  The patient has spots, moles and lesions to be evaluated, some may be new or changing and the patient has concerns that these could be cancer.  The following portions of the chart were reviewed this encounter and updated as appropriate:   Tobacco  Allergies  Meds  Problems  Med Hx  Surg Hx  Fam Hx     Review of Systems:  No other skin or systemic complaints except as noted in HPI or Assessment and Plan.  Objective  Well appearing patient in no apparent distress; mood and affect are within normal limits.  A full examination was performed including scalp, head, eyes, ears, nose, lips, neck, chest, axillae, abdomen, back, buttocks, bilateral upper extremities, bilateral lower extremities, hands, feet, fingers, toes, fingernails, and toenails. All findings within normal limits unless otherwise noted below.  Scalp Clear.   Face and ears x 6 (6) Erythematous thin papules/macules with gritty scale.   Scalp x 2, arms x 1 (3) Erythematous stuck-on, waxy papule or plaque  Face Mid face erythema with telangiectasias +/- scattered inflammatory papules.   B/L leg Erythematous, scaly patches involving the ankle and distal lower leg with associated lower leg edema.   Right Ear Firm SQ nodule.    Assessment & Plan  Seborrheic dermatitis Scalp Seborrheic Dermatitis  -  is a chronic persistent rash characterized by pinkness and scaling most commonly of the mid face but also can occur on the scalp (dandruff), ears; mid chest, mid back and groin.  It tends to be exacerbated by stress and cooler weather.  People who have neurologic disease may experience new onset or exacerbation of  existing seborrheic dermatitis.  The condition is not curable but treatable and can be controlled.  Continue Ketoconazole 2% shampoo QD 3d/wk. Let sit 5-10 minutes before washing off.   Related Medications ketoconazole (NIZORAL) 2 % shampoo Apply topically 3 (three) times a week.  AK (actinic keratosis) (6) Face and ears x 6 Destruction of lesion - Face and ears x 6 Complexity: simple   Destruction method: cryotherapy   Informed consent: discussed and consent obtained   Timeout:  patient name, date of birth, surgical site, and procedure verified Lesion destroyed using liquid nitrogen: Yes   Region frozen until ice ball extended beyond lesion: Yes   Outcome: patient tolerated procedure well with no complications   Post-procedure details: wound care instructions given    Inflamed seborrheic keratosis (3) Scalp x 2, arms x 1 Symptomatic, irritating, patient would like treated. Destruction of lesion - Scalp x 2, arms x 1 Complexity: simple   Destruction method: cryotherapy   Informed consent: discussed and consent obtained   Timeout:  patient name, date of birth, surgical site, and procedure verified Lesion destroyed using liquid nitrogen: Yes   Region frozen until ice ball extended beyond lesion: Yes   Outcome: patient tolerated procedure well with no complications   Post-procedure details: wound care instructions given    Rosacea Face Rosacea is a chronic progressive skin condition usually affecting the face of adults, causing redness and/or acne bumps. It is treatable but not curable. It sometimes affects the eyes (ocular rosacea) as well. It may respond to topical and/or systemic medication  and can flare with stress, sun exposure, alcohol, exercise, topical steroids (including hydrocortisone/cortisone 10) and some foods.  Daily application of broad spectrum spf 30+ sunscreen to face is recommended to reduce flares.  Counseling for BBL / IPL / Laser and Coordination of  Care Discussed the treatment option of Broad Band Light (BBL) Intense Pulsed Light (IPL) / Laser.  Typically we recommend at least 1-3 treatment sessions about 5-8 weeks apart for best results.  The patient's condition may require "maintenance treatments" in the future.  The fee for BBL / laser treatments is $350 per treatment session for the whole face.  A fee can be quoted for other parts of the body. Insurance typically does not pay for BBL/laser treatments and therefore the fee is an out-of-pocket cost.  Stasis dermatitis of both legs B/L leg With Schamberg's purpura - Stasis in the legs causes chronic leg swelling, which may result in itchy or painful rashes, skin discoloration, skin texture changes, and sometimes ulceration.  Recommend daily graduated compression hose/stockings- easiest to put on first thing in morning, remove at bedtime.  Elevate legs as much as possible. Avoid salt/sodium rich foods.  Epidermal inclusion cyst Right Ear Benign-appearing. Exam most consistent with an epidermal inclusion cyst. Discussed that a cyst is a benign growth that can grow over time and sometimes get irritated or inflamed. Recommend observation if it is not bothersome. Discussed option of surgical excision to remove it if it is growing, symptomatic, or other changes noted. Please call for new or changing lesions so they can be evaluated.  Lentigines - Scattered tan macules - Due to sun exposure - Benign-appearing, observe - Recommend daily broad spectrum sunscreen SPF 30+ to sun-exposed areas, reapply every 2 hours as needed. - Call for any changes  Seborrheic Keratoses - Stuck-on, waxy, tan-brown papules and/or plaques  - Benign-appearing - Discussed benign etiology and prognosis. - Observe - Call for any changes  Melanocytic Nevi - Tan-brown and/or pink-flesh-colored symmetric macules and papules - Benign appearing on exam today - Observation - Call clinic for new or changing moles -  Recommend daily use of broad spectrum spf 30+ sunscreen to sun-exposed areas.   Hemangiomas - Red papules - Discussed benign nature - Observe - Call for any changes  Actinic Damage - Chronic condition, secondary to cumulative UV/sun exposure - diffuse scaly erythematous macules with underlying dyspigmentation - Recommend daily broad spectrum sunscreen SPF 30+ to sun-exposed areas, reapply every 2 hours as needed.  - Staying in the shade or wearing long sleeves, sun glasses (UVA+UVB protection) and wide brim hats (4-inch brim around the entire circumference of the hat) are also recommended for sun protection.  - Call for new or changing lesions.  Skin cancer screening performed today.  Return in about 1 year (around 04/29/2023) for TBSE.  Luther Redo, CMA, am acting as scribe for Sarina Ser, MD . Documentation: I have reviewed the above documentation for accuracy and completeness, and I agree with the above.  Sarina Ser, MD

## 2022-04-28 NOTE — Patient Instructions (Signed)
Due to recent changes in healthcare laws, you may see results of your pathology and/or laboratory studies on MyChart before the doctors have had a chance to review them. We understand that in some cases there may be results that are confusing or concerning to you. Please understand that not all results are received at the same time and often the doctors may need to interpret multiple results in order to provide you with the best plan of care or course of treatment. Therefore, we ask that you please give us 2 business days to thoroughly review all your results before contacting the office for clarification. Should we see a critical lab result, you will be contacted sooner.   If You Need Anything After Your Visit  If you have any questions or concerns for your doctor, please call our main line at [phone] and press option 4 to reach your doctor's medical assistant. If no one answers, please leave a voicemail as directed and we will return your call as soon as possible. Messages left after 4 pm will be answered the following business day.   You may also send us a message via MyChart. We typically respond to MyChart messages within 1-2 business days.  For prescription refills, please ask your pharmacy to contact our office. Our fax number is [phone].  If you have an urgent issue when the clinic is closed that cannot wait until the next business day, you can page your doctor at the number below.    Please note that while we do our best to be available for urgent issues outside of office hours, we are not available 24/7.   If you have an urgent issue and are unable to reach us, you may choose to seek medical care at your doctor's office, retail clinic, urgent care center, or emergency room.  If you have a medical emergency, please immediately call 911 or go to the emergency department.  Pager Numbers  - Dr. Kowalski: [phone]  - Dr. Moye: [phone]  - Dr. Stewart:  [phone]  In the event of inclement weather, please call our main line at [phone] for an update on the status of any delays or closures.  Dermatology Medication Tips: Please keep the boxes that topical medications come in in order to help keep track of the instructions about where and how to use these. Pharmacies typically print the medication instructions only on the boxes and not directly on the medication tubes.   If your medication is too expensive, please contact our office at [phone] option 4 or send us a message through MyChart.   We are unable to tell what your co-pay for medications will be in advance as this is different depending on your insurance coverage. However, we may be able to find a substitute medication at lower cost or fill out paperwork to get insurance to cover a needed medication.   If a prior authorization is required to get your medication covered by your insurance company, please allow us 1-2 business days to complete this process.  Drug prices often vary depending on where the prescription is filled and some pharmacies may offer cheaper prices.  The website www.goodrx.com contains coupons for medications through different pharmacies. The prices here do not account for what the cost may be with help from insurance (it may be cheaper with your insurance), but the website can give you the price if you did not use any insurance.  - You can print the associated coupon and take it with   your prescription to the pharmacy.  - You may also stop by our office during regular business hours and pick up a GoodRx coupon card.  - If you need your prescription sent electronically to a different pharmacy, notify our office through [facility] MyChart or by phone at [phone] option 4.     Si Usted Necesita Algo Despus de Su Visita  Tambin puede enviarnos un mensaje a travs de MyChart. Por lo general respondemos a los mensajes de MyChart en el transcurso de 1 a 2  das hbiles.  Para renovar recetas, por favor pida a su farmacia que se ponga en contacto con nuestra oficina. Nuestro nmero de fax es el [phone].  Si tiene un asunto urgente cuando la clnica est cerrada y que no puede esperar hasta el siguiente da hbil, puede llamar/localizar a su doctor(a) al nmero que aparece a continuacin.   Por favor, tenga en cuenta que aunque hacemos todo lo posible para estar disponibles para asuntos urgentes fuera del horario de oficina, no estamos disponibles las 24 horas del da, los 7 das de la semana.   Si tiene un problema urgente y no puede comunicarse con nosotros, puede optar por buscar atencin mdica  en el consultorio de su doctor(a), en una clnica privada, en un centro de atencin urgente o en una sala de emergencias.  Si tiene una emergencia mdica, por favor llame inmediatamente al 911 o vaya a la sala de emergencias.  Nmeros de bper  - Dr. Kowalski: [phone]  - Dra. Moye: [phone]  - Dra. Stewart: [phone]  En caso de inclemencias del tiempo, por favor llame a nuestra lnea principal al [phone] para una actualizacin sobre el estado de cualquier retraso o cierre.  Consejos para la medicacin en dermatologa: Por favor, guarde las cajas en las que vienen los medicamentos de uso tpico para ayudarle a seguir las instrucciones sobre dnde y cmo usarlos. Las farmacias generalmente imprimen las instrucciones del medicamento slo en las cajas y no directamente en los tubos del medicamento.   Si su medicamento es muy caro, por favor, pngase en contacto con nuestra oficina llamando al [phone] y presione la opcin 4 o envenos un mensaje a travs de MyChart.   No podemos decirle cul ser su copago por los medicamentos por adelantado ya que esto es diferente dependiendo de la cobertura de su seguro. Sin embargo, es posible que podamos encontrar un medicamento sustituto a menor costo o llenar un formulario para que el  seguro cubra el medicamento que se considera necesario.   Si se requiere una autorizacin previa para que su compaa de seguros cubra su medicamento, por favor permtanos de 1 a 2 das hbiles para completar este proceso.  Los precios de los medicamentos varan con frecuencia dependiendo del lugar de dnde se surte la receta y alguna farmacias pueden ofrecer precios ms baratos.  El sitio web www.goodrx.com tiene cupones para medicamentos de diferentes farmacias. Los precios aqu no tienen en cuenta lo que podra costar con la ayuda del seguro (puede ser ms barato con su seguro), pero el sitio web puede darle el precio si no utiliz ningn seguro.  - Puede imprimir el cupn correspondiente y llevarlo con su receta a la farmacia.  - Tambin puede pasar por nuestra oficina durante el horario de atencin regular y recoger una tarjeta de cupones de GoodRx.  - Si necesita que su receta se enve electrnicamente a una farmacia diferente, informe a nuestra oficina a travs de MyChart de [place]   o por telfono llamando al [phone] y presione la opcin 4.  

## 2022-04-30 ENCOUNTER — Encounter: Payer: Self-pay | Admitting: Dermatology

## 2022-05-05 DIAGNOSIS — I10 Essential (primary) hypertension: Secondary | ICD-10-CM | POA: Diagnosis not present

## 2022-05-05 DIAGNOSIS — E7800 Pure hypercholesterolemia, unspecified: Secondary | ICD-10-CM | POA: Diagnosis not present

## 2022-05-05 DIAGNOSIS — E118 Type 2 diabetes mellitus with unspecified complications: Secondary | ICD-10-CM | POA: Diagnosis not present

## 2022-05-05 DIAGNOSIS — F325 Major depressive disorder, single episode, in full remission: Secondary | ICD-10-CM | POA: Diagnosis not present

## 2022-05-05 DIAGNOSIS — I700 Atherosclerosis of aorta: Secondary | ICD-10-CM | POA: Diagnosis not present

## 2022-05-13 DIAGNOSIS — Z794 Long term (current) use of insulin: Secondary | ICD-10-CM | POA: Diagnosis not present

## 2022-05-13 DIAGNOSIS — E1169 Type 2 diabetes mellitus with other specified complication: Secondary | ICD-10-CM | POA: Diagnosis not present

## 2022-05-13 DIAGNOSIS — E669 Obesity, unspecified: Secondary | ICD-10-CM | POA: Diagnosis not present

## 2022-05-13 DIAGNOSIS — I10 Essential (primary) hypertension: Secondary | ICD-10-CM | POA: Diagnosis not present

## 2022-05-13 DIAGNOSIS — E1165 Type 2 diabetes mellitus with hyperglycemia: Secondary | ICD-10-CM | POA: Diagnosis not present

## 2022-06-03 DIAGNOSIS — E7800 Pure hypercholesterolemia, unspecified: Secondary | ICD-10-CM | POA: Diagnosis not present

## 2022-06-03 DIAGNOSIS — E118 Type 2 diabetes mellitus with unspecified complications: Secondary | ICD-10-CM | POA: Diagnosis not present

## 2022-06-03 DIAGNOSIS — F325 Major depressive disorder, single episode, in full remission: Secondary | ICD-10-CM | POA: Diagnosis not present

## 2022-06-03 DIAGNOSIS — I700 Atherosclerosis of aorta: Secondary | ICD-10-CM | POA: Diagnosis not present

## 2022-06-03 DIAGNOSIS — I10 Essential (primary) hypertension: Secondary | ICD-10-CM | POA: Diagnosis not present

## 2022-06-18 DIAGNOSIS — Z0000 Encounter for general adult medical examination without abnormal findings: Secondary | ICD-10-CM | POA: Diagnosis not present

## 2022-06-18 DIAGNOSIS — D869 Sarcoidosis, unspecified: Secondary | ICD-10-CM | POA: Diagnosis not present

## 2022-06-18 DIAGNOSIS — I700 Atherosclerosis of aorta: Secondary | ICD-10-CM | POA: Diagnosis not present

## 2022-06-18 DIAGNOSIS — F325 Major depressive disorder, single episode, in full remission: Secondary | ICD-10-CM | POA: Diagnosis not present

## 2022-06-18 DIAGNOSIS — Z1331 Encounter for screening for depression: Secondary | ICD-10-CM | POA: Diagnosis not present

## 2022-06-18 DIAGNOSIS — I10 Essential (primary) hypertension: Secondary | ICD-10-CM | POA: Diagnosis not present

## 2022-06-18 DIAGNOSIS — Z86718 Personal history of other venous thrombosis and embolism: Secondary | ICD-10-CM | POA: Diagnosis not present

## 2022-06-18 DIAGNOSIS — E118 Type 2 diabetes mellitus with unspecified complications: Secondary | ICD-10-CM | POA: Diagnosis not present

## 2022-07-02 DIAGNOSIS — G8929 Other chronic pain: Secondary | ICD-10-CM | POA: Diagnosis not present

## 2022-07-02 DIAGNOSIS — R413 Other amnesia: Secondary | ICD-10-CM | POA: Diagnosis not present

## 2022-07-02 DIAGNOSIS — G2581 Restless legs syndrome: Secondary | ICD-10-CM | POA: Diagnosis not present

## 2022-07-02 DIAGNOSIS — M545 Low back pain, unspecified: Secondary | ICD-10-CM | POA: Diagnosis not present

## 2022-07-16 DIAGNOSIS — F325 Major depressive disorder, single episode, in full remission: Secondary | ICD-10-CM | POA: Diagnosis not present

## 2022-07-16 DIAGNOSIS — E7800 Pure hypercholesterolemia, unspecified: Secondary | ICD-10-CM | POA: Diagnosis not present

## 2022-07-16 DIAGNOSIS — I700 Atherosclerosis of aorta: Secondary | ICD-10-CM | POA: Diagnosis not present

## 2022-07-16 DIAGNOSIS — I10 Essential (primary) hypertension: Secondary | ICD-10-CM | POA: Diagnosis not present

## 2022-07-16 DIAGNOSIS — E118 Type 2 diabetes mellitus with unspecified complications: Secondary | ICD-10-CM | POA: Diagnosis not present

## 2022-08-13 DIAGNOSIS — M23204 Derangement of unspecified medial meniscus due to old tear or injury, left knee: Secondary | ICD-10-CM | POA: Diagnosis not present

## 2022-08-13 DIAGNOSIS — G8929 Other chronic pain: Secondary | ICD-10-CM | POA: Diagnosis not present

## 2022-08-13 DIAGNOSIS — M1712 Unilateral primary osteoarthritis, left knee: Secondary | ICD-10-CM | POA: Diagnosis not present

## 2022-08-13 DIAGNOSIS — M25562 Pain in left knee: Secondary | ICD-10-CM | POA: Diagnosis not present

## 2022-08-13 DIAGNOSIS — M25462 Effusion, left knee: Secondary | ICD-10-CM | POA: Diagnosis not present

## 2022-08-25 DIAGNOSIS — E1165 Type 2 diabetes mellitus with hyperglycemia: Secondary | ICD-10-CM | POA: Diagnosis not present

## 2022-08-25 DIAGNOSIS — Z794 Long term (current) use of insulin: Secondary | ICD-10-CM | POA: Diagnosis not present

## 2022-08-26 ENCOUNTER — Other Ambulatory Visit: Payer: Self-pay | Admitting: Sports Medicine

## 2022-08-26 DIAGNOSIS — M25462 Effusion, left knee: Secondary | ICD-10-CM

## 2022-08-26 DIAGNOSIS — G8929 Other chronic pain: Secondary | ICD-10-CM

## 2022-08-26 DIAGNOSIS — M23204 Derangement of unspecified medial meniscus due to old tear or injury, left knee: Secondary | ICD-10-CM

## 2022-09-01 ENCOUNTER — Other Ambulatory Visit: Payer: Medicare HMO

## 2022-09-01 DIAGNOSIS — E1165 Type 2 diabetes mellitus with hyperglycemia: Secondary | ICD-10-CM | POA: Diagnosis not present

## 2022-09-01 DIAGNOSIS — Z794 Long term (current) use of insulin: Secondary | ICD-10-CM | POA: Diagnosis not present

## 2022-09-01 DIAGNOSIS — E782 Mixed hyperlipidemia: Secondary | ICD-10-CM | POA: Diagnosis not present

## 2022-09-01 DIAGNOSIS — I10 Essential (primary) hypertension: Secondary | ICD-10-CM | POA: Diagnosis not present

## 2022-09-05 ENCOUNTER — Ambulatory Visit
Admission: RE | Admit: 2022-09-05 | Discharge: 2022-09-05 | Disposition: A | Discharge status: Home / Self Care | Payer: Medicare HMO | Source: Ambulatory Visit | Attending: Sports Medicine | Admitting: Sports Medicine

## 2022-09-05 DIAGNOSIS — G8929 Other chronic pain: Secondary | ICD-10-CM

## 2022-09-05 DIAGNOSIS — M25462 Effusion, left knee: Secondary | ICD-10-CM | POA: Diagnosis not present

## 2022-09-05 DIAGNOSIS — M25562 Pain in left knee: Secondary | ICD-10-CM | POA: Diagnosis not present

## 2022-09-05 DIAGNOSIS — M23204 Derangement of unspecified medial meniscus due to old tear or injury, left knee: Secondary | ICD-10-CM

## 2022-09-16 DIAGNOSIS — N529 Male erectile dysfunction, unspecified: Secondary | ICD-10-CM | POA: Diagnosis not present

## 2022-09-22 DIAGNOSIS — S83232A Complex tear of medial meniscus, current injury, left knee, initial encounter: Secondary | ICD-10-CM | POA: Diagnosis not present

## 2022-09-22 DIAGNOSIS — M1712 Unilateral primary osteoarthritis, left knee: Secondary | ICD-10-CM | POA: Diagnosis not present

## 2022-10-21 ENCOUNTER — Other Ambulatory Visit: Payer: Self-pay | Admitting: Surgery

## 2022-10-22 DIAGNOSIS — M1712 Unilateral primary osteoarthritis, left knee: Secondary | ICD-10-CM | POA: Diagnosis not present

## 2022-10-22 DIAGNOSIS — G8929 Other chronic pain: Secondary | ICD-10-CM | POA: Diagnosis not present

## 2022-10-22 DIAGNOSIS — I825Y9 Chronic embolism and thrombosis of unspecified deep veins of unspecified proximal lower extremity: Secondary | ICD-10-CM | POA: Diagnosis not present

## 2022-10-22 DIAGNOSIS — M25562 Pain in left knee: Secondary | ICD-10-CM | POA: Diagnosis not present

## 2022-10-22 DIAGNOSIS — S83232A Complex tear of medial meniscus, current injury, left knee, initial encounter: Secondary | ICD-10-CM | POA: Diagnosis not present

## 2022-10-22 DIAGNOSIS — M25462 Effusion, left knee: Secondary | ICD-10-CM | POA: Diagnosis not present

## 2022-10-23 DIAGNOSIS — I2510 Atherosclerotic heart disease of native coronary artery without angina pectoris: Secondary | ICD-10-CM | POA: Diagnosis not present

## 2022-10-23 DIAGNOSIS — D869 Sarcoidosis, unspecified: Secondary | ICD-10-CM | POA: Diagnosis not present

## 2022-10-25 ENCOUNTER — Encounter (HOSPITAL_COMMUNITY): Payer: Self-pay | Admitting: Urgent Care

## 2022-10-27 ENCOUNTER — Other Ambulatory Visit: Payer: Self-pay | Admitting: Pulmonary Disease

## 2022-10-27 DIAGNOSIS — I2510 Atherosclerotic heart disease of native coronary artery without angina pectoris: Secondary | ICD-10-CM

## 2022-10-27 DIAGNOSIS — D869 Sarcoidosis, unspecified: Secondary | ICD-10-CM

## 2022-10-28 ENCOUNTER — Other Ambulatory Visit: Payer: Self-pay

## 2022-10-28 ENCOUNTER — Ambulatory Visit
Admission: RE | Admit: 2022-10-28 | Discharge: 2022-10-28 | Disposition: A | Discharge status: Home / Self Care | Payer: Medicare HMO | Source: Ambulatory Visit | Attending: Pulmonary Disease | Admitting: Pulmonary Disease

## 2022-10-28 ENCOUNTER — Encounter
Admission: RE | Admit: 2022-10-28 | Discharge: 2022-10-28 | Disposition: A | Discharge status: Home / Self Care | Payer: Medicare HMO | Source: Ambulatory Visit | Attending: Surgery | Admitting: Surgery

## 2022-10-28 VITALS — BP 129/59 | HR 78 | Resp 18 | Ht 71.5 in | Wt 257.9 lb

## 2022-10-28 DIAGNOSIS — E119 Type 2 diabetes mellitus without complications: Secondary | ICD-10-CM | POA: Diagnosis not present

## 2022-10-28 DIAGNOSIS — Z01812 Encounter for preprocedural laboratory examination: Secondary | ICD-10-CM

## 2022-10-28 DIAGNOSIS — Z794 Long term (current) use of insulin: Secondary | ICD-10-CM | POA: Diagnosis not present

## 2022-10-28 DIAGNOSIS — I2510 Atherosclerotic heart disease of native coronary artery without angina pectoris: Secondary | ICD-10-CM

## 2022-10-28 DIAGNOSIS — Z01818 Encounter for other preprocedural examination: Secondary | ICD-10-CM | POA: Diagnosis not present

## 2022-10-28 DIAGNOSIS — I77810 Thoracic aortic ectasia: Secondary | ICD-10-CM

## 2022-10-28 DIAGNOSIS — D869 Sarcoidosis, unspecified: Secondary | ICD-10-CM | POA: Insufficient documentation

## 2022-10-28 HISTORY — DX: Atherosclerosis of aorta: I70.0

## 2022-10-28 HISTORY — DX: Spinal stenosis, lumbar region without neurogenic claudication: M48.061

## 2022-10-28 HISTORY — DX: Other amnesia: R41.3

## 2022-10-28 HISTORY — DX: Other chronic pain: G89.29

## 2022-10-28 HISTORY — DX: Morbid (severe) obesity due to excess calories: E66.01

## 2022-10-28 HISTORY — DX: Pain in right knee: M25.561

## 2022-10-28 HISTORY — DX: Low back pain, unspecified: M54.50

## 2022-10-28 HISTORY — DX: Thoracic aortic ectasia: I77.810

## 2022-10-28 HISTORY — DX: Atherosclerotic heart disease of native coronary artery without angina pectoris: I25.10

## 2022-10-28 HISTORY — DX: Cramp and spasm: R25.2

## 2022-10-28 HISTORY — DX: Pure hypercholesterolemia, unspecified: E78.00

## 2022-10-28 HISTORY — DX: Obesity, unspecified: E66.9

## 2022-10-28 HISTORY — DX: Type 2 diabetes mellitus with unspecified complications: E11.8

## 2022-10-28 HISTORY — DX: Unspecified right bundle-branch block: I45.10

## 2022-10-28 HISTORY — DX: Major depressive disorder, single episode, in full remission: F32.5

## 2022-10-28 HISTORY — DX: Restless legs syndrome: G25.81

## 2022-10-28 HISTORY — DX: Chronic embolism and thrombosis of unspecified deep veins of unspecified lower extremity: I82.509

## 2022-10-28 HISTORY — DX: Male erectile dysfunction, unspecified: N52.9

## 2022-10-28 HISTORY — DX: Unilateral primary osteoarthritis, right knee: M17.11

## 2022-10-28 LAB — COMPREHENSIVE METABOLIC PANEL
ALT: 22 U/L (ref 0–44)
AST: 28 U/L (ref 15–41)
Albumin: 4.2 g/dL (ref 3.5–5.0)
Alkaline Phosphatase: 85 U/L (ref 38–126)
Anion gap: 11 (ref 5–15)
BUN: 19 mg/dL (ref 8–23)
CO2: 27 mmol/L (ref 22–32)
Calcium: 9.3 mg/dL (ref 8.9–10.3)
Chloride: 98 mmol/L (ref 98–111)
Creatinine, Ser: 1.13 mg/dL (ref 0.61–1.24)
GFR, Estimated: >60 mL/min (ref >60)
Glucose, Bld: 211 mg/dL — ABNORMAL HIGH (ref 70–99)
Potassium: 4.2 mmol/L (ref 3.5–5.1)
Sodium: 136 mmol/L (ref 135–145)
Total Bilirubin: 0.6 mg/dL (ref 0.3–1.2)
Total Protein: 7 g/dL (ref 6.5–8.1)

## 2022-10-28 LAB — CBC WITH DIFFERENTIAL/PLATELET
Abs Immature Granulocytes: 0.03 10*3/uL (ref 0.00–0.07)
Basophils Absolute: 0 10*3/uL (ref 0.0–0.1)
Basophils Relative: 0 %
Eosinophils Absolute: 0.1 10*3/uL (ref 0.0–0.5)
Eosinophils Relative: 2 %
HCT: 35.3 % — ABNORMAL LOW (ref 39.0–52.0)
Hemoglobin: 12.4 g/dL — ABNORMAL LOW (ref 13.0–17.0)
Immature Granulocytes: 1 %
Lymphocytes Relative: 16 %
Lymphs Abs: 0.7 10*3/uL (ref 0.7–4.0)
MCH: 32 pg (ref 26.0–34.0)
MCHC: 35.1 g/dL (ref 30.0–36.0)
MCV: 91.2 fL (ref 80.0–100.0)
Monocytes Absolute: 0.4 10*3/uL (ref 0.1–1.0)
Monocytes Relative: 8 %
Neutro Abs: 3.3 10*3/uL (ref 1.7–7.7)
Neutrophils Relative %: 73 %
Platelets: 176 10*3/uL (ref 150–400)
RBC: 3.87 MIL/uL — ABNORMAL LOW (ref 4.22–5.81)
RDW: 14 % (ref 11.5–15.5)
WBC: 4.5 10*3/uL (ref 4.0–10.5)
nRBC: 0 % (ref 0.0–0.2)

## 2022-10-28 LAB — URINALYSIS, ROUTINE W REFLEX MICROSCOPIC
Bilirubin Urine: NEGATIVE
Glucose, UA: >=500 mg/dL — AB
Hgb urine dipstick: NEGATIVE
Ketones, ur: NEGATIVE mg/dL
Leukocytes,Ua: NEGATIVE
Nitrite: NEGATIVE
Protein, ur: NEGATIVE mg/dL
Specific Gravity, Urine: 1.017 (ref 1.005–1.030)
Squamous Epithelial / HPF: NONE SEEN /HPF (ref 0–5)
pH: 5 (ref 5.0–8.0)

## 2022-10-28 LAB — TYPE AND SCREEN
ABO/RH(D): B POS
Antibody Screen: NEGATIVE

## 2022-10-28 LAB — SURGICAL PCR SCREEN
MRSA, PCR: NEGATIVE
Staphylococcus aureus: NEGATIVE

## 2022-10-28 NOTE — Patient Instructions (Addendum)
Your procedure is scheduled on: Tuesday November 04, 2022. Report to the Registration Desk on the 1st floor of [facility]. To find out your arrival time, please call [phone] between 1PM - 3PM on: Monday November 03, 2022. If your arrival time is 6:00 am, do not arrive before that time as the [facility] entrance doors do not open until 6:00 am.  REMEMBER: Instructions that are not followed completely may result in serious medical risk, up to and including death; or upon the discretion of your surgeon and anesthesiologist your surgery may need to be rescheduled.  Do not eat food after midnight the night before surgery.  No gum chewing or hard candies.  You may however, drink CLEAR liquids up to 2 hours before you are scheduled to arrive for your surgery. Do not drink anything within 2 hours of your scheduled arrival time.  Clear liquids include: - water  Do NOT drink anything that is not on this list.  Type 1 and Type 2 diabetics should only drink water.  In addition, your doctor has ordered for you to drink the provided:  Gatorade G2 Drinking this carbohydrate drink up to two hours before surgery helps to reduce insulin resistance and improve patient outcomes. Please complete drinking 2 hours before scheduled arrival time.  One week prior to surgery: Stop Anti-inflammatories (NSAIDS) such as Advil, Aleve, Ibuprofen, Motrin, Naproxen, Naprosyn and Aspirin based products such as Excedrin, Goody's Powder, BC Powder. Stop ANY OVER THE COUNTER supplements until after surgery. You may however, continue to take Tylenol if needed for pain up until the day of surgery.  Continue taking all prescribed medications with the exception of the following: Stop Semaglutide, 1 MG/DOSE, 4 MG/3ML SOPN 7 days prior to your surgery (10/27/22) Stop metFORMIN (GLUCOPHAGE-XR) 500 MG 2 days before your procedure (take last dose 11/01/22) Take 1/2 of your normal insulin glargine (LANTUS) 100 UNIT/ML Solostar  Pen the night before your surgery (take 23 units) Do not take any insulin the morning of your surgery  Follow recommendations from Cardiologist or PCP regarding stopping blood thinners. Stop XARELTO 20 MG TABS (10/31/22) as instructed by your provider   TAKE ONLY THESE MEDICATIONS THE MORNING OF SURGERY WITH A SIP OF WATER:  amLODipine (NORVASC) 5 MG  atorvastatin (LIPITOR) 10 MG  azaTHIOprine (IMURAN) 50 MG   gabapentin (NEURONTIN) 600 MG  omeprazole (PRILOSEC) 40 MG Antacid (take one the night before and one on the morning of surgery - helps to prevent nausea after surgery.) pramipexole (MIRAPEX) 0.5 MG    Use inhalers on the day of surgery and bring to the hospital.  No Alcohol for 24 hours before or after surgery.  No Smoking including e-cigarettes for 24 hours before surgery.  No chewable tobacco products for at least 6 hours before surgery.  No nicotine patches on the day of surgery.  Do not use any "recreational" drugs for at least a week (preferably 2 weeks) before your surgery.  Please be advised that the combination of cocaine and anesthesia may have negative outcomes, up to and including death. If you test positive for cocaine, your surgery will be cancelled.  On the morning of surgery brush your teeth with toothpaste and water, you may rinse your mouth with mouthwash if you wish. Do not swallow any toothpaste or mouthwash.  Use CHG Soap or wipes as directed on instruction sheet.  Do not wear jewelry, make-up, hairpins, clips or nail polish.  Do not wear lotions, powders, or perfumes.  Do not shave body hair from the neck down 48 hours before surgery.  Contact lenses, hearing aids and dentures may not be worn into surgery.  Do not bring valuables to the hospital. [facility] is not responsible for any missing/lost belongings or valuables.   Total Shoulder Arthroplasty:  use Benzoyl Peroxide 5% Gel as directed on instruction sheet.  Bring your C-PAP to the  hospital in case you may have to spend the night.   Notify your doctor if there is any change in your medical condition (cold, fever, infection).  Wear comfortable clothing (specific to your surgery type) to the hospital.  After surgery, you can help prevent lung complications by doing breathing exercises.  Take deep breaths and cough every 1-2 hours. Your doctor may order a device called an Incentive Spirometer to help you take deep breaths. When coughing or sneezing, hold a pillow firmly against your incision with both hands. This is called "splinting." Doing this helps protect your incision. It also decreases belly discomfort.  If you are being admitted to the hospital overnight, leave your suitcase in the car. After surgery it may be brought to your room.  In case of increased patient census, it may be necessary for you, the patient, to continue your postoperative care in the Same Day Surgery department.  If you are being discharged the day of surgery, you will not be allowed to drive home. You will need a responsible individual to drive you home and stay with you for 24 hours after surgery.   If you are taking public transportation, you will need to have a responsible individual with you.  Please call the Pre-admissions Testing Dept. at [phone] if you have any questions about these instructions.  Surgery Visitation Policy:  Patients having surgery or a procedure may have two visitors.  Children under the age of 58 must have an adult with them who is not the patient.  Inpatient Visitation:    Visiting hours are 7 a.m. to 8 p.m. Up to four visitors are allowed at one time in a patient room. The visitors may rotate out with other people during the day.  One visitor age 52 or older may stay with the patient overnight and must be in the room by 8 p.m.   Pre-operative 5 CHG Bath Instructions   You can play a key role in reducing the risk of infection after surgery. Your skin  needs to be as free of germs as possible. You can reduce the number of germs on your skin by washing with CHG (chlorhexidine gluconate) soap before surgery. CHG is an antiseptic soap that kills germs and continues to kill germs even after washing.   DO NOT use if you have an allergy to chlorhexidine/CHG or antibacterial soaps. If your skin becomes reddened or irritated, stop using the CHG and notify one of our RNs at [phone].   Please shower with the CHG soap starting 4 days before surgery using the following schedule:     Please keep in mind the following:  DO NOT shave, including legs and underarms, starting the day of your first shower.   You may shave your face at any point before/day of surgery.  Place clean sheets on your bed the day you start using CHG soap. Use a clean washcloth (not used since being washed) for each shower. DO NOT sleep with pets once you start using the CHG.   CHG Shower Instructions:  If you choose to wash your hair  and private area, wash first with your normal shampoo/soap.  After you use shampoo/soap, rinse your hair and body thoroughly to remove shampoo/soap residue.  Turn the water OFF and apply about 3 tablespoons (45 ml) of CHG soap to a CLEAN washcloth.  Apply CHG soap ONLY FROM YOUR NECK DOWN TO YOUR TOES (washing for 3-5 minutes)  DO NOT use CHG soap on face, private areas, open wounds, or sores.  Pay special attention to the area where your surgery is being performed.  If you are having back surgery, having someone wash your back for you may be helpful. Wait 2 minutes after CHG soap is applied, then you may rinse off the CHG soap.  Pat dry with a clean towel  Put on clean clothes/pajamas   If you choose to wear lotion, please use ONLY the CHG-compatible lotions on the back of this paper.     Additional instructions for the day of surgery: DO NOT APPLY any lotions, deodorants, cologne, or perfumes.   Put on clean/comfortable clothes.  Brush  your teeth.  Ask your nurse before applying any prescription medications to the skin.      CHG Compatible Lotions   Aveeno Moisturizing lotion  Cetaphil Moisturizing Cream  Cetaphil Moisturizing Lotion  Clairol Herbal Essence Moisturizing Lotion, Dry Skin  Clairol Herbal Essence Moisturizing Lotion, Extra Dry Skin  Clairol Herbal Essence Moisturizing Lotion, Normal Skin  Curel Age Defying Therapeutic Moisturizing Lotion with Alpha Hydroxy  Curel Extreme Care Body Lotion  Curel Soothing Hands Moisturizing Hand Lotion  Curel Therapeutic Moisturizing Cream, Fragrance-Free  Curel Therapeutic Moisturizing Lotion, Fragrance-Free  Curel Therapeutic Moisturizing Lotion, Original Formula  Eucerin Daily Replenishing Lotion  Eucerin Dry Skin Therapy Plus Alpha Hydroxy Crme  Eucerin Dry Skin Therapy Plus Alpha Hydroxy Lotion  Eucerin Original Crme  Eucerin Original Lotion  Eucerin Plus Crme Eucerin Plus Lotion  Eucerin TriLipid Replenishing Lotion  Keri Anti-Bacterial Hand Lotion  Keri Deep Conditioning Original Lotion Dry Skin Formula Softly Scented  Keri Deep Conditioning Original Lotion, Fragrance Free Sensitive Skin Formula  Keri Lotion Fast Absorbing Fragrance Free Sensitive Skin Formula  Keri Lotion Fast Absorbing Softly Scented Dry Skin Formula  Keri Original Lotion  Keri Skin Renewal Lotion Keri Silky Smooth Lotion  Keri Silky Smooth Sensitive Skin Lotion  Nivea Body Creamy Conditioning Oil  Nivea Body Extra Enriched Lotion  Nivea Body Original Lotion  Nivea Body Sheer Moisturizing Lotion Nivea Crme  Nivea Skin Firming Lotion  NutraDerm 30 Skin Lotion  NutraDerm Skin Lotion  NutraDerm Therapeutic Skin Cream  NutraDerm Therapeutic Skin Lotion  ProShield Protective Hand Cream  Provon moisturizing lotion  How to Use an Incentive Spirometer  An incentive spirometer is a tool that measures how well you are filling your lungs with each breath. Learning to take long, deep  breaths using this tool can help you keep your lungs clear and active. This may help to reverse or lessen your chance of developing breathing (pulmonary) problems, especially infection. You may be asked to use a spirometer: After a surgery. If you have a lung problem or a history of smoking. After a long period of time when you have been unable to move or be active. If the spirometer includes an indicator to show the highest number that you have reached, your health care provider or respiratory therapist will help you set a goal. Keep a log of your progress as told by your health care provider. What are the risks? Breathing too quickly may cause dizziness  or cause you to pass out. Take your time so you do not get dizzy or light-headed. If you are in pain, you may need to take pain medicine before doing incentive spirometry. It is harder to take a deep breath if you are having pain. How to use your incentive spirometer  Sit up on the edge of your bed or on a chair. Hold the incentive spirometer so that it is in an upright position. Before you use the spirometer, breathe out normally. Place the mouthpiece in your mouth. Make sure your lips are closed tightly around it. Breathe in slowly and as deeply as you can through your mouth, causing the piston or the ball to rise toward the top of the chamber. Hold your breath for 3-5 seconds, or for as long as possible. If the spirometer includes a coach indicator, use this to guide you in breathing. Slow down your breathing if the indicator goes above the marked areas. Remove the mouthpiece from your mouth and breathe out normally. The piston or ball will return to the bottom of the chamber. Rest for a few seconds, then repeat the steps 10 or more times. Take your time and take a few normal breaths between deep breaths so that you do not get dizzy or light-headed. Do this every 1-2 hours when you are awake. If the spirometer includes a goal marker to show  the highest number you have reached (best effort), use this as a goal to work toward during each repetition. After each set of 10 deep breaths, cough a few times. This will help to make sure that your lungs are clear. If you have an incision on your chest or abdomen from surgery, place a pillow or a rolled-up towel firmly against the incision when you cough. This can help to reduce pain while taking deep breaths and coughing. General tips When you are able to get out of bed: Walk around often. Continue to take deep breaths and cough in order to clear your lungs. Keep using the incentive spirometer until your health care provider says it is okay to stop using it. If you have been in the hospital, you may be told to keep using the spirometer at home. Contact a health care provider if: You are having difficulty using the spirometer. You have trouble using the spirometer as often as instructed. Your pain medicine is not giving enough relief for you to use the spirometer as told. You have a fever. Get help right away if: You develop shortness of breath. You develop a cough with bloody mucus from the lungs. You have fluid or blood coming from an incision site after you cough. Summary An incentive spirometer is a tool that can help you learn to take long, deep breaths to keep your lungs clear and active. You may be asked to use a spirometer after a surgery, if you have a lung problem or a history of smoking, or if you have been inactive for a long period of time. Use your incentive spirometer as instructed every 1-2 hours while you are awake. If you have an incision on your chest or abdomen, place a pillow or a rolled-up towel firmly against your incision when you cough. This will help to reduce pain. Get help right away if you have shortness of breath, you cough up bloody mucus, or blood comes from your incision when you cough. This information is not intended to replace advice given to you by your  health care provider. Make  sure you discuss any questions you have with your health care provider. Document Revised: 06/20/2019 Document Reviewed: 06/20/2019 Elsevier Patient Education  2023 [employer].   Please go to the following website to access important education materials concerning your upcoming joint replacement.                                   [email or web address]

## 2022-10-30 DIAGNOSIS — D869 Sarcoidosis, unspecified: Secondary | ICD-10-CM | POA: Diagnosis not present

## 2022-10-30 DIAGNOSIS — H43813 Vitreous degeneration, bilateral: Secondary | ICD-10-CM | POA: Diagnosis not present

## 2022-10-30 DIAGNOSIS — Z961 Presence of intraocular lens: Secondary | ICD-10-CM | POA: Diagnosis not present

## 2022-10-30 DIAGNOSIS — E119 Type 2 diabetes mellitus without complications: Secondary | ICD-10-CM | POA: Diagnosis not present

## 2022-10-31 DIAGNOSIS — R0609 Other forms of dyspnea: Secondary | ICD-10-CM | POA: Diagnosis not present

## 2022-10-31 DIAGNOSIS — Z01818 Encounter for other preprocedural examination: Secondary | ICD-10-CM | POA: Diagnosis not present

## 2022-10-31 DIAGNOSIS — I10 Essential (primary) hypertension: Secondary | ICD-10-CM | POA: Diagnosis not present

## 2022-11-04 ENCOUNTER — Ambulatory Visit: Admission: RE | Admit: 2022-11-04 | Payer: Medicare HMO | Source: Home / Self Care | Admitting: Surgery

## 2022-11-04 ENCOUNTER — Encounter: Admission: RE | Payer: Self-pay | Source: Home / Self Care

## 2022-11-04 DIAGNOSIS — Z01812 Encounter for preprocedural laboratory examination: Secondary | ICD-10-CM

## 2022-11-04 SURGERY — ARTHROPLASTY, KNEE, UNICOMPARTMENTAL
Anesthesia: Choice | Site: Knee | Laterality: Left

## 2022-11-13 DIAGNOSIS — R0609 Other forms of dyspnea: Secondary | ICD-10-CM | POA: Diagnosis not present

## 2022-12-01 ENCOUNTER — Other Ambulatory Visit: Payer: Self-pay | Admitting: Surgery

## 2022-12-01 DIAGNOSIS — I825Y9 Chronic embolism and thrombosis of unspecified deep veins of unspecified proximal lower extremity: Secondary | ICD-10-CM | POA: Diagnosis not present

## 2022-12-01 DIAGNOSIS — G8929 Other chronic pain: Secondary | ICD-10-CM | POA: Diagnosis not present

## 2022-12-01 DIAGNOSIS — S83232D Complex tear of medial meniscus, current injury, left knee, subsequent encounter: Secondary | ICD-10-CM | POA: Diagnosis not present

## 2022-12-01 DIAGNOSIS — M1712 Unilateral primary osteoarthritis, left knee: Secondary | ICD-10-CM | POA: Diagnosis not present

## 2022-12-01 DIAGNOSIS — M25562 Pain in left knee: Secondary | ICD-10-CM | POA: Diagnosis not present

## 2022-12-03 ENCOUNTER — Encounter
Admission: RE | Admit: 2022-12-03 | Discharge: 2022-12-03 | Disposition: A | Discharge status: Home / Self Care | Payer: Medicare HMO | Source: Ambulatory Visit | Attending: Surgery | Admitting: Surgery

## 2022-12-03 ENCOUNTER — Encounter: Payer: Self-pay | Admitting: Surgery

## 2022-12-03 ENCOUNTER — Inpatient Hospital Stay: Admission: RE | Admit: 2022-12-03 | Payer: Medicare HMO | Source: Ambulatory Visit

## 2022-12-03 VITALS — Ht 71.5 in | Wt 257.9 lb

## 2022-12-03 DIAGNOSIS — I10 Essential (primary) hypertension: Secondary | ICD-10-CM | POA: Insufficient documentation

## 2022-12-03 DIAGNOSIS — Z01812 Encounter for preprocedural laboratory examination: Secondary | ICD-10-CM | POA: Insufficient documentation

## 2022-12-03 DIAGNOSIS — Z01818 Encounter for other preprocedural examination: Secondary | ICD-10-CM | POA: Diagnosis present

## 2022-12-03 DIAGNOSIS — Z79899 Other long term (current) drug therapy: Secondary | ICD-10-CM | POA: Insufficient documentation

## 2022-12-03 DIAGNOSIS — I2510 Atherosclerotic heart disease of native coronary artery without angina pectoris: Secondary | ICD-10-CM

## 2022-12-03 HISTORY — DX: Other specified postprocedural states: Z98.890

## 2022-12-03 HISTORY — DX: Other nonspecific abnormal finding of lung field: R91.8

## 2022-12-03 HISTORY — DX: Type 2 diabetes mellitus without complications: E11.9

## 2022-12-03 LAB — BASIC METABOLIC PANEL
Anion gap: 10 (ref 5–15)
BUN: 16 mg/dL (ref 8–23)
CO2: 25 mmol/L (ref 22–32)
Calcium: 9.5 mg/dL (ref 8.9–10.3)
Chloride: 102 mmol/L (ref 98–111)
Creatinine, Ser: 1.17 mg/dL (ref 0.61–1.24)
GFR, Estimated: >60 mL/min (ref >60)
Glucose, Bld: 168 mg/dL — ABNORMAL HIGH (ref 70–99)
Potassium: 3.8 mmol/L (ref 3.5–5.1)
Sodium: 137 mmol/L (ref 135–145)

## 2022-12-03 LAB — TYPE AND SCREEN
ABO/RH(D): B POS
Antibody Screen: NEGATIVE

## 2022-12-03 NOTE — Progress Notes (Signed)
Perioperative / Anesthesia Services  Pre-Admission Testing Clinical Review / Preoperative Anesthesia Consult  Date: 12/08/22  Patient Demographics:  Name: George Daniels DOB:   May 24, 1945 MRN:   [id number]  Planned Surgical Procedure(s):    Case: [id number] Date/Time: 12/09/22 0715   Procedure: LEFT PARTIAL KNEE REPLACEMENT. (Left: Knee)   Anesthesia type: Choice   Pre-op diagnosis:      Primary osteoarthritis of left knee M17.12     Complex tear of medial meniscus of left knee as current injury, initial encounter S83.232A   Location: [facility] OR ROOM 02 / [facility]   Surgeons: George Flake, MD     NOTE: Available PAT nursing documentation and vital signs have been reviewed. Clinical nursing staff has updated patient's PMH/PSHx, current medication list, and drug allergies/intolerances to ensure comprehensive history available to assist in medical decision making as it pertains to the aforementioned surgical procedure and anticipated anesthetic course. Extensive review of available clinical information personally performed. [name] [facility] and [facility] updated with any diagnoses/procedures that  may have been inadvertently omitted during his intake with the pre-admission testing department's nursing staff.  Clinical Discussion:  George Daniels is a 77 y.o. male who is submitted for pre-surgical anesthesia review and clearance prior to him undergoing the above procedure. Patient has never been a smoker. Pertinent PMH includes: CAD, diastolic dysfunction, ascending aorta dilatation, DVT x 2, aortic atherosclerosis, RBBB, HTN, HLD, T2DM, DOE, pulmonary sarcoidosis, OSAH (does not require nocturnal PAP therapy, GERD (on daily PPI), OA, chronic lower back pain, lumbar spinal stenosis, ED (on PDE5i), insomnia, short-term memory loss, RLS, depression, anxiety (on BZO).   Patient is followed by cardiology George Alar, MD). He was last seen in the cardiology clinic on 10/31/2022; notes  reviewed. At the time of his clinic visit, patient complaining of exertional dyspnea. He denied any chest pain,  PND, orthopnea, palpitations, significant peripheral edema, weakness, fatigue, vertiginous symptoms, or presyncope/syncope. Patient with a past medical history significant for cardiovascular diagnoses. Documented physical exam was grossly benign, providing no evidence of acute exacerbation and/or decompensation of the patient's known cardiovascular conditions.  TTE performed on 12/01/2019 revealed a normal left ventricular systolic function with an EF >55%.  There were no regional wall motion abnormalities. Left ventricular diastolic Doppler parameters consistent with abnormal relaxation (G1DD).  Right atrium mildly enlarged.  There is trivial to mild pan valvular regurgitation.  All transvalvular gradients were noted to be normal provide no evidence suggestive of valvular stenosis.  RVSP 31.6 mmHg.  Aorta normal in size with no evidence of aneurysmal dilatation.  Given patient's history of recurrent DVT, patient remains on daily oral anticoagulation therapy using rivaroxaban.  Patient reportedly compliant with therapy with no evidence or reports of GI bleeding.  Blood pressure well controlled at 130/70 mmHg on currently prescribed CCB (amlodipine) and ACEi/diuretic (lisinopril-HCTZ) therapies.  Patient is on atorvastatin for his HLD diagnosis and ASCVD prevention. T2DM well controlled on currently prescribed regimen; last HgbA1c was 6.8% when checked on 08/25/2022.  In the setting of known cardiovascular disease, it is important note that patient is on a PDE5i (sildenafil) for erectile dysfunction diagnosis.  He does have an OSAH diagnosis, however he does not require the use of nocturnal PAP therapy.  Patient with chronic exertional dyspnea related to his underlying pulmonary sarcoidosis.  He is jointly managed by pulmonary medicine George Christmas, MD). Functional capacity is somewhat limited by  patient's age and multiple medical comorbidities. Per the DASI, patient is not able  to achieve at least 4 METS of physical activity without experiencing, at least to some degree, significant angina/anginal equivalent symptoms. No changes were made to his medication regimen during his visit with cardiology.  Patient scheduled for upcoming elective orthopedic surgery, decision was made to pursue further cardiovascular workup via myocardial perfusion imaging.  Patient scheduled to follow-up with outpatient cardiology in 6 weeks or sooner if needed.  Since patient was last seen by cardiology, he has undergone the recommended noninvasive cardiovascular workup.  Myocardial perfusion imaging study performed on 11/13/2022 was reportedly normal, revealing no evidence of stress-induced myocardial ischemia or arrhythmia; no sonographic evidence of scar.  Results communicated by cardiology; awaiting final result note to be posted in care everywhere for review.  George BEH is scheduled for an elective LEFT PARTIAL KNEE REPLACEMENT. (Left: Knee) on 12/09/2022 with Dr. Leron Croak, MD.  Given patient's past medical history significant for cardiovascular diagnoses, presurgical clearances were sought from both cardiology and pulmonary medicine.  Specialty clearances were obtained as follows.   Per pulmonary medicine, "patient may proceed with planned surgical intervention at an overall ACCEPTABLE risk of experiencing significant cardiopulmonary complications.  ARISCAT ([place]) Preoperative pulmonary risk index in adults- low risk: 1.6% pulmonary postoperative complication rate. [place]ullah respiratory failure index- low risk: 1.8% pulmonary postoperative complication rate. Chales Abrahams calculator for postoperative respiratory failure- low risk: 1.07% probability of postoperative respiratory failure".   Per cardiology, "this patient is optimized for surgery and may proceed with the planned procedural course with a ACCEPTABLE risk  of significant perioperative cardiovascular complications".  Again, this patient is on daily oral anticoagulation therapy using a DOAC medication. He has been instructed on recommendations for holding his rivaroxaban for 3 days prior to his procedure with plans to restart as soon as postoperative bleeding risk felt to be minimized by his attending surgeon. The patient has been instructed that his last dose of his rivaroxaban should be on 12/05/2022.  Given patient's past medical history significant for recurrent DVT, cardiology has recommended and prescribed enoxaparin bridging for this procedure.  Patient reports previous perioperative complications with anesthesia in the past. Patient has a PMH (+) for PONV. Symptoms and history of PONV will be discussed with patient by anesthesia team on the day of her procedure. Interventions will be ordered as deemed necessary based on patient's individual care needs as determined by anesthesiologist.  Additionally, patient also has a history of (+) PONV in a first-degree relative (daughter) in review of the available records, it is noted that patient underwent a general anesthetic course here at [facility] ([facility] in 11/2019 without documented complications.      12/03/2022   10:00 AM 10/28/2022   11:15 AM 04/28/2022    1:22 PM  Vitals with BMI  Height 5' 11.5" 5' 11.5"   Weight 257 lbs 15 oz 257 lbs 14 oz   BMI 35.48 35.47   Systolic  129 108  Diastolic  59 66  Pulse  78 98    Providers/Specialists:   NOTE: Primary physician provider listed below. Patient may have been seen by APP or partner within same practice.   PROVIDER ROLE / SPECIALTY LAST OV  Poggi, Excell Seltzer, MD Orthopedics (Surgeon) 12/01/2022  Lauro Regulus, MD Primary Care Provider 09/16/2022  Clotilde Dieter, DO Cardiology 10/31/2022  Wendall Mola, MD Endocrinology 09/01/2022  Vida Rigger, MD  Pulmonary Medicine 10/23/2022   Allergies:   Penicillins  Current Home Medications:   No current facility-administered medications for  this encounter.    ALPRAZolam (XANAX) 0.5 MG tablet   amLODipine (NORVASC) 5 MG tablet   atorvastatin (LIPITOR) 10 MG tablet   azaTHIOprine (IMURAN) 50 MG tablet   B-D UF III MINI PEN NEEDLES 31G X 5 MM MISC   cetirizine (ZYRTEC) 10 MG tablet   enoxaparin (LOVENOX) 120 MG/0.8ML injection   gabapentin (NEURONTIN) 300 MG capsule   insulin aspart (NOVOLOG) 100 UNIT/ML FlexPen   insulin glargine (LANTUS) 100 UNIT/ML Solostar Pen   ketoconazole (NIZORAL) 2 % shampoo   lisinopril-hydrochlorothiazide (PRINZIDE,ZESTORETIC) 20-25 MG tablet   metFORMIN (GLUCOPHAGE-XR) 500 MG 24 hr tablet   omeprazole (PRILOSEC) 40 MG capsule   pramipexole (MIRAPEX) 0.5 MG tablet   Semaglutide, 1 MG/DOSE, 4 MG/3ML SOPN   sildenafil (REVATIO) 20 MG tablet   traMADol (ULTRAM) 50 MG tablet   traZODone (DESYREL) 100 MG tablet   triamcinolone cream (KENALOG) 0.1 %   XARELTO 20 MG TABS tablet   History:   Past Medical History:  Diagnosis Date   Anxiety    a.) on BZO PRN (alprazolam)   Aortic atherosclerosis (HCC)    Arthritis    Ascending aorta dilatation (HCC) 10/28/2022   a.) cCTA 10/28/2022: asc Ao ectatic measuring 3.9 cm with no discrete aneurysm   Cataract cortical, senile    Chronic bilateral low back pain, unspecified whether sciatica present    Complication of anesthesia    a.) PONV   Coronary artery disease involving native coronary artery of native heart without angina pectoris 10/28/2022   a.) cCTA 10/28/2022: Ca2+ 135 (39th %'ile for age/sex/race matched control); calcifications in LAD and RCA territories; c.) ECG stress 11/13/2022: normal   Deep vein thrombosis (DVT) of popliteal vein of right lower extremity (HCC) 12/19/2021   Deep venous thrombosis (DVT) of right peroneal vein (HCC) 09/10/2017   Depression    Diastolic dysfunction 12/01/2019   a.) TTE 12/01/2019: EF >55%, mild RAE, triv AR/MR/PR,  mild TR, RVSP 31.6, G1DD   DM (diabetes mellitus), type 2 (HCC)    DOE (dyspnea on exertion)    ED (erectile dysfunction)    a.) on PDE5i (tadalafil)   Family history of adverse reaction to anesthesia    a.) postoperative nausea in 1st degree relative (daughter)   GERD (gastroesophageal reflux disease)    Hemorrhoids    Hepatic steatosis    History of chicken pox    Hyperlipidemia    Hypertension    Insomnia    a.) uses trazodone PRN   Long term current use of immunosuppressive drug    a.) on azothiaprine for pulmonary sarcoidosis   Lumbar spinal stenosis    Memory loss, short term    Obesity    On rivaroxaban therapy    PONV (postoperative nausea and vomiting)    Primary osteoarthritis of right knee    Pulmonary nodules    RBBB (right bundle branch block)    RLS (restless legs syndrome)    a.) takes pramipexole   Sarcoidosis    a.) followed by PCCM; b.) Tx'd now with azothiaprine (formerly on prednisone and MTX)   Severe obesity (BMI 35.0-39.9) with comorbidity (HCC)    Sleep apnea    a.) does not require nocturnal PAP therapy   Superficial thrombophlebitis 09/10/2017   a.) RIGHT GSV in upper thigh extending down to calf   Past Surgical History:  Procedure Laterality Date   ACHILLES TENDON REPAIR Right 2014   CATARACT EXTRACTION, BILATERAL     COLONOSCOPY WITH PROPOFOL N/A 11/23/2019  Procedure: COLONOSCOPY WITH PROPOFOL;  Surgeon: Toledo, Boykin Nearing, MD;  Location: [facility] END[facility]COPY;  Service: Gastroenterology;  Laterality: N/A;   EXCISION MASS NECK Right 03/31/2018   Procedure: EXCISION MASS NECK;  Surgeon: Geanie Logan, MD;  Location: [facility] ORS;  Service: ENT;  Laterality: Right;   EYE SURGERY Bilateral    cataracts   KNEE ARTHROSCOPY WITH MEDIAL MENISECTOMY Right 12/09/2018   Procedure: KNEE ARTHROSCOPY;  Surgeon: George Flake, MD;  Location: [facility] ORS;  Service: Orthopedics;  Laterality: Right;   SHOULDER ARTHROSCOPY WITH OPEN ROTATOR CUFF REPAIR Right 08/02/2015    Procedure: SHOULDER ARTHROSCOPY WITH LIMITED DEBRIDEMENT,  MINI OPEN ROTATOR CUFF REPAIR, DECOMPRESSION;  Surgeon: George Flake, MD;  Location: [facility] ORS;  Service: Orthopedics;  Laterality: Right;   TENNIS ELBOW RELEASE/NIRSCHEL PROCEDURE Right 10/02/2016   Procedure: TENNIS ELBOW / OPEN DEBRIDMENT OF THE COMMON EXTENSOR ORGIN OF RIGHT ELBOW;  Surgeon: George Flake, MD;  Location: [facility] ORS;  Service: Orthopedics;  Laterality: Right;   TONSILLECTOMY     as a child   No family history on file. Social History   Tobacco Use   Smoking status: Never   Smokeless tobacco: Never  Vaping Use   Vaping status: Never Used  Substance Use Topics   Alcohol use: Yes    Alcohol/week: 0.0 - 1.0 standard drinks of alcohol    Comment: 1 bloody Mary every two weeks   Drug use: Never    Pertinent Clinical Results:  LABS:   No visits with results within 3 Day(s) from this visit.  Latest known visit with results is:  Hospital Outpatient Visit on 12/03/2022  Component Date Value Ref Range Status   Sodium 12/03/2022 137  135 - 145 mmol/L Final   Potassium 12/03/2022 3.8  3.5 - 5.1 mmol/L Final   Chloride 12/03/2022 102  98 - 111 mmol/L Final   CO2 12/03/2022 25  22 - 32 mmol/L Final   Glucose, Bld 12/03/2022 168 (H)  70 - 99 mg/dL Final   Glucose reference range applies only to samples taken after fasting for at least 8 hours.   BUN 12/03/2022 16  8 - 23 mg/dL Final   Creatinine, Ser 12/03/2022 1.17  0.61 - 1.24 mg/dL Final   Calcium 78/29/5621 9.5  8.9 - 10.3 mg/dL Final   GFR, Estimated 12/03/2022 >60  >60 mL/min Final   Comment: (NOTE) Calculated using the CKD-EPI Creatinine Equation (2021)    Anion gap 12/03/2022 10  5 - 15 Final   Performed at [facility], [address] [place], [place] [address]   ABO/RH(D) 12/03/2022 B POS   Final   Antibody Screen 12/03/2022 NEG   Final   Sample Expiration 12/03/2022 12/17/2022,2359   Final   Extend sample reason 12/03/2022    Final                    Value:NO TRANSFUSIONS OR PREGNANCY IN THE PAST 3 MONTHS Performed at [facility], [address] [place], [place] [address]     ECG: Date: 10/23/2022 Time ECG obtained: 1507 PM Rate: 76 bpm Rhythm:  Normal sinus rhythm; RBBB Axis (leads I and aVF): Normal Intervals: PR 168 ms. QRS 150 ms. QTc 470 ms. ST segment and T wave changes: No evidence of acute ST segment elevation or depression.  Comparison: Similar to previous tracing obtained on 12/07/2018   IMAGING / PROCEDURES: CT CARDIAC SCORING performed on 10/28/2022 No acute extracardiac findings Hepatic steatosis. Numerous calcified mediastinal  and hilar nodes and stable areas of lung parenchymal changes corresponding to history of sarcoid.  Coronary calcium score of 135. This was 39th percentile for age and sex matched control. CAC 100-299 in LAD, RCA. CAC-DRS A2/N3 Consider aspirin and statin if no contraindication. Continue heart healthy lifestyle and risk factor modification  MR KNEE LEFT WO CONTRAST performed on 09/05/2022 Increased intrasubstance degeneration of the posterior segment of the body and posterior horn of the medial meniscus and the region of the prior undersurface tears. New high-grade attenuation of the posterior horn of the medial meniscus near the root, a high-grade radial tear with new extrusion of the root of the posterior horn of the medial meniscus laterally towards the intercondylar notch. New vertical oriented tear within the central third of the posterior horn of the medial meniscus Acute nondisplaced subchondral insufficiency fracture of the weight-bearing medial femoral condyle. Moderate marrow edema throughout the medial femoral condyle. Additional proximal mid to medial tibial marrow edema, likely stress reactive changes. Mildly increased moderate joint effusion. Small Baker's cyst with partially leaking component.  TRANSTHORACIC ECHOCARDIOGRAM performed on 12/01/2019 Normal left  ventricular systolic function with an EF of >55% No regional wall motion abnormalities Left ventricular diastolic Doppler parameters consistent with abnormal relaxation (G1DD). Right atrium mildly enlarged trivial aortic, mitral, and pulmonary valve regurgitation Mild tricuspid valve regurgitation Normal gradients; no valvular stenosis No pericardial effusion  Impression and Plan:  George Daniels has been referred for pre-anesthesia review and clearance prior to him undergoing the planned anesthetic and procedural courses. Available labs, pertinent testing, and imaging results were personally reviewed by me in preparation for upcoming operative/procedural course. [facility] medical record has been updated following extensive record review and patient interview with PAT staff.   This patient has been appropriately cleared by cardiology (ACCEPTABLE) and by pulmonary medicine (ACCEPTABLE) with the individually indicated risk of significant cardiovascular/cardiopulmonary complications. Based on clinical review performed today (12/08/22), barring any significant acute changes in the patient's overall condition, it is anticipated that he will be able to proceed with the planned surgical intervention. Any acute changes in clinical condition may necessitate his procedure being postponed and/or cancelled. Patient will meet with anesthesia team (MD and/or CRNA) on the day of his procedure for preoperative evaluation/assessment. Questions regarding anesthetic course will be fielded at that time.   Pre-surgical instructions were reviewed with the patient during his PAT appointment, and questions were fielded to satisfaction by PAT clinical staff. He has been instructed on which medications that he will need to hold prior to surgery, as well as the ones that have been deemed safe/appropriate to take on the day of his procedure. As part of the general education provided by PAT, patient made aware both verbally and  in writing, that he would need to abstain from the use of any illegal substances during his perioperative course.  He was advised that failure to follow the provided instructions could necessitate case cancellation or result in serious perioperative complications up to and including death. Patient encouraged to contact PAT and/or his surgeon's office to discuss any questions or concerns that may arise prior to surgery; verbalized understanding.   Quentin Mulling, MSN, APRN, FNP-C, CEN [facility]  Peri-operative Services Nurse Practitioner Phone: [phone] Fax: [phone] 12/08/22 8:50 AM  NOTE: This note has been prepared using Dragon dictation software. Despite my best ability to proofread, there is always the potential that unintentional transcriptional errors may still occur from this process.

## 2022-12-03 NOTE — Patient Instructions (Signed)
Your procedure is scheduled on:12-09-22 Tuesday Report to the Registration Desk on the 1st floor of the [facility].Then proceed to the 2nd floor Surgery Desk To find out your arrival time, please call [phone] between 1PM - 3PM on:12-08-22 Monday If your arrival time is 6:00 am, do not arrive before that time as the [facility] entrance doors do not open until 6:00 am.  REMEMBER: Instructions that are not followed completely may result in serious medical risk, up to and including death; or upon the discretion of your surgeon and anesthesiologist your surgery may need to be rescheduled.  Do not eat food after midnight the night before surgery.  No gum chewing or hard candies.  You may however, drink Water up to 2 hours before you are scheduled to arrive for your surgery. Do not drink anything within 2 hours of your scheduled arrival time.  In addition, your doctor has ordered for you to drink the provided:  Gatorade G2 Drinking this carbohydrate drink up to two hours before surgery helps to reduce insulin resistance and improve patient outcomes. Please complete drinking 2 hours before scheduled arrival time.  One week prior to surgery: Stop Anti-inflammatories (NSAIDS) such as Advil, Aleve, Ibuprofen, Motrin, Naproxen, Naprosyn and Aspirin based products such as Excedrin, Goody's Powder, BC Powder.You may however, continue to take Tylenol/Tramadol if needed for pain up until the day of surgery. Stop ANY OVER THE COUNTER supplements/vitamins NOW (12-03-22) until after surgery.   Continue taking all prescribed medications with the exception of the following: -XARELTO-last dose will be on 12-05-22 (Friday)-You will then begin your Lovenox bridge on 76-24-23 (Saturday) as instructed by Dr. Binnie Rail office -metFORMIN (GLUCOPHAGE-XR)-Stop 2 days prior to surgery-Last dose will be on 12-06-22 (Saturday) -sildenafil (REVATIO)-Stop 2 days prior to surgery-Last dose will be on 12-06-22  (Saturday) -Semaglutide (Ozempic)-Stop 7 days prior to surgery-Last dose was on 11-26-22-Do NOT take again until AFTER Surgery  TAKE ONLY THESE MEDICATIONS THE MORNING OF SURGERY WITH A SIP OF WATER: -amLODipine (NORVASC)  -atorvastatin (LIPITOR)  -gabapentin (NEURONTIN)  -omeprazole (PRILOSEC)  -pramipexole (MIRAPEX)   Take half of your Lantus Insulin (23 units) the night prior to surgery and NO Insulin the morning of surgery  No Alcohol for 24 hours before or after surgery.  No Smoking including e-cigarettes for 24 hours before surgery.  No chewable tobacco products for at least 6 hours before surgery.  No nicotine patches on the day of surgery.  Do not use any "recreational" drugs for at least a week (preferably 2 weeks) before your surgery.  Please be advised that the combination of cocaine and anesthesia may have negative outcomes, up to and including death. If you test positive for cocaine, your surgery will be cancelled.  On the morning of surgery brush your teeth with toothpaste and water, you may rinse your mouth with mouthwash if you wish. Do not swallow any toothpaste or mouthwash.  Use CHG Soap as directed on instruction sheet.  Do not wear jewelry, make-up, hairpins, clips or nail polish.  Do not wear lotions, powders, or perfumes.   Do not shave body hair from the neck down 48 hours before surgery.  Contact lenses, hearing aids and dentures may not be worn into surgery.  Do not bring valuables to the hospital. [facility] is not responsible for any missing/lost belongings or valuables.    Notify your doctor if there is any change in your medical condition (cold, fever, infection).  Wear comfortable clothing (specific to your surgery  type) to the hospital.  After surgery, you can help prevent lung complications by doing breathing exercises.  Take deep breaths and cough every 1-2 hours. Your doctor may order a device called an Incentive Spirometer to help you take  deep breaths. When coughing or sneezing, hold a pillow firmly against your incision with both hands. This is called "splinting." Doing this helps protect your incision. It also decreases belly discomfort.  If you are being admitted to the hospital overnight, leave your suitcase in the car. After surgery it may be brought to your room.  In case of increased patient census, it may be necessary for you, the patient, to continue your postoperative care in the Same Day Surgery department.  If you are being discharged the day of surgery, you will not be allowed to drive home. You will need a responsible individual to drive you home and stay with you for 24 hours after surgery.   If you are taking public transportation, you will need to have a responsible individual with you.  Please call the Pre-admissions Testing Dept. at [phone] if you have any questions about these instructions.  Surgery Visitation Policy:  Patients having surgery or a procedure may have two visitors.  Children under the age of 12 must have an adult with them who is not the patient.  Inpatient Visitation:    Visiting hours are 7 a.m. to 8 p.m. Up to four visitors are allowed at one time in a patient room. The visitors may rotate out with other people during the day.  One visitor age 76 or older may stay with the patient overnight and must be in the room by 8 p.m.    Pre-operative 5 CHG Bath Instructions   You can play a key role in reducing the risk of infection after surgery. Your skin needs to be as free of germs as possible. You can reduce the number of germs on your skin by washing with CHG (chlorhexidine gluconate) soap before surgery. CHG is an antiseptic soap that kills germs and continues to kill germs even after washing.   DO NOT use if you have an allergy to chlorhexidine/CHG or antibacterial soaps. If your skin becomes reddened or irritated, stop using the CHG and notify one of our RNs at [phone].    Please shower with the CHG soap starting 4 days before surgery using the following schedule:     Please keep in mind the following:  DO NOT shave, including legs and underarms, starting the day of your first shower.   You may shave your face at any point before/day of surgery.  Place clean sheets on your bed the day you start using CHG soap. Use a clean washcloth (not used since being washed) for each shower. DO NOT sleep with pets once you start using the CHG.   CHG Shower Instructions:  If you choose to wash your hair and private area, wash first with your normal shampoo/soap.  After you use shampoo/soap, rinse your hair and body thoroughly to remove shampoo/soap residue.  Turn the water OFF and apply about 3 tablespoons (45 ml) of CHG soap to a CLEAN washcloth.  Apply CHG soap ONLY FROM YOUR NECK DOWN TO YOUR TOES (washing for 3-5 minutes)  DO NOT use CHG soap on face, private areas, open wounds, or sores.  Pay special attention to the area where your surgery is being performed.  If you are having back surgery, having someone wash your back for you may  be helpful. Wait 2 minutes after CHG soap is applied, then you may rinse off the CHG soap.  Pat dry with a clean towel  Put on clean clothes/pajamas   If you choose to wear lotion, please use ONLY the CHG-compatible lotions on the back of this paper.     Additional instructions for the day of surgery: DO NOT APPLY any lotions, deodorants, cologne, or perfumes.   Put on clean/comfortable clothes.  Brush your teeth.  Ask your nurse before applying any prescription medications to the skin.      CHG Compatible Lotions   Aveeno Moisturizing lotion  Cetaphil Moisturizing Cream  Cetaphil Moisturizing Lotion  Clairol Herbal Essence Moisturizing Lotion, Dry Skin  Clairol Herbal Essence Moisturizing Lotion, Extra Dry Skin  Clairol Herbal Essence Moisturizing Lotion, Normal Skin  Curel Age Defying Therapeutic Moisturizing Lotion  with Alpha Hydroxy  Curel Extreme Care Body Lotion  Curel Soothing Hands Moisturizing Hand Lotion  Curel Therapeutic Moisturizing Cream, Fragrance-Free  Curel Therapeutic Moisturizing Lotion, Fragrance-Free  Curel Therapeutic Moisturizing Lotion, Original Formula  Eucerin Daily Replenishing Lotion  Eucerin Dry Skin Therapy Plus Alpha Hydroxy Crme  Eucerin Dry Skin Therapy Plus Alpha Hydroxy Lotion  Eucerin Original Crme  Eucerin Original Lotion  Eucerin Plus Crme Eucerin Plus Lotion  Eucerin TriLipid Replenishing Lotion  Keri Anti-Bacterial Hand Lotion  Keri Deep Conditioning Original Lotion Dry Skin Formula Softly Scented  Keri Deep Conditioning Original Lotion, Fragrance Free Sensitive Skin Formula  Keri Lotion Fast Absorbing Fragrance Free Sensitive Skin Formula  Keri Lotion Fast Absorbing Softly Scented Dry Skin Formula  Keri Original Lotion  Keri Skin Renewal Lotion Keri Silky Smooth Lotion  Keri Silky Smooth Sensitive Skin Lotion  Nivea Body Creamy Conditioning Oil  Nivea Body Extra Enriched Lotion  Nivea Body Original Lotion  Nivea Body Sheer Moisturizing Lotion Nivea Crme  Nivea Skin Firming Lotion  NutraDerm 30 Skin Lotion  NutraDerm Skin Lotion  NutraDerm Therapeutic Skin Cream  NutraDerm Therapeutic Skin Lotion  ProShield Protective Hand Cream  Provon moisturizing lotion  How to Use an Incentive Spirometer An incentive spirometer is a tool that measures how well you are filling your lungs with each breath. Learning to take long, deep breaths using this tool can help you keep your lungs clear and active. This may help to reverse or lessen your chance of developing breathing (pulmonary) problems, especially infection. You may be asked to use a spirometer: After a surgery. If you have a lung problem or a history of smoking. After a long period of time when you have been unable to move or be active. If the spirometer includes an indicator to show the highest number  that you have reached, your health care provider or respiratory therapist will help you set a goal. Keep a log of your progress as told by your health care provider. What are the risks? Breathing too quickly may cause dizziness or cause you to pass out. Take your time so you do not get dizzy or light-headed. If you are in pain, you may need to take pain medicine before doing incentive spirometry. It is harder to take a deep breath if you are having pain. How to use your incentive spirometer  Sit up on the edge of your bed or on a chair. Hold the incentive spirometer so that it is in an upright position. Before you use the spirometer, breathe out normally. Place the mouthpiece in your mouth. Make sure your lips are closed tightly around it. Breathe in  slowly and as deeply as you can through your mouth, causing the piston or the ball to rise toward the top of the chamber. Hold your breath for 3-5 seconds, or for as long as possible. If the spirometer includes a coach indicator, use this to guide you in breathing. Slow down your breathing if the indicator goes above the marked areas. Remove the mouthpiece from your mouth and breathe out normally. The piston or ball will return to the bottom of the chamber. Rest for a few seconds, then repeat the steps 10 or more times. Take your time and take a few normal breaths between deep breaths so that you do not get dizzy or light-headed. Do this every 1-2 hours when you are awake. If the spirometer includes a goal marker to show the highest number you have reached (best effort), use this as a goal to work toward during each repetition. After each set of 10 deep breaths, cough a few times. This will help to make sure that your lungs are clear. If you have an incision on your chest or abdomen from surgery, place a pillow or a rolled-up towel firmly against the incision when you cough. This can help to reduce pain while taking deep breaths and coughing. General  tips When you are able to get out of bed: Walk around often. Continue to take deep breaths and cough in order to clear your lungs. Keep using the incentive spirometer until your health care provider says it is okay to stop using it. If you have been in the hospital, you may be told to keep using the spirometer at home. Contact a health care provider if: You are having difficulty using the spirometer. You have trouble using the spirometer as often as instructed. Your pain medicine is not giving enough relief for you to use the spirometer as told. You have a fever. Get help right away if: You develop shortness of breath. You develop a cough with bloody mucus from the lungs. You have fluid or blood coming from an incision site after you cough. Summary An incentive spirometer is a tool that can help you learn to take long, deep breaths to keep your lungs clear and active. You may be asked to use a spirometer after a surgery, if you have a lung problem or a history of smoking, or if you have been inactive for a long period of time. Use your incentive spirometer as instructed every 1-2 hours while you are awake. If you have an incision on your chest or abdomen, place a pillow or a rolled-up towel firmly against your incision when you cough. This will help to reduce pain. Get help right away if you have shortness of breath, you cough up bloody mucus, or blood comes from your incision when you cough. This information is not intended to replace advice given to you by your health care provider. Make sure you discuss any questions you have with your health care provider. Document Revised: 06/20/2019 Document Reviewed: 06/20/2019 Elsevier Patient Education  2024 [employer].

## 2022-12-05 NOTE — Progress Notes (Signed)
  Perioperative Services Pre-Admission/Anesthesia Testing    Date: 12/05/22  Name: George Daniels MRN:   [id number]  Re: GLP-1 clearance and provider recommendations   Planned Surgical Procedure(s):    Case: [id number] Date/Time: 12/09/22 0715   Procedure: LEFT PARTIAL KNEE REPLACEMENT. (Left: Knee)   Anesthesia type: Choice   Pre-op diagnosis:      Primary osteoarthritis of left knee M17.12     Complex tear of medial meniscus of left knee as current injury, initial encounter S83.232A   Location: [facility] OR ROOM 02 / [facility]   Surgeons: Christena Flake, MD      Clinical Notes:  Patient is scheduled for the above procedure with the indicated provider/surgeon. In review of his medication reconciliation it was noted that patient is on a prescribed GLP-1 medication. Per guidelines issued by the American Society of Anesthesiologists (ASA), it is recommended that these medications be held for 7 days prior to the patient undergoing any type of elective surgical procedure. The patient is taking the following GLP-1 medication:  [x]  SEMAGLUTIDE   []  EXENATIDE  []  LIRAGLUTIDE   []  LIXISENATIDE  []  DULAGLUTIDE     []  TIRZEPATIDE (GLP-1/GIP)  Reached out to prescribing provider Tedd Sias, MD) to make them aware of the guidelines from anesthesia. Given that this patient takes the prescribed GLP-1 medication for his  diabetes diagnosis, rather than for weight loss, recommendations from the prescribing provider were solicited. Prescribing provider made aware of the following so that informed decision/POC can be developed for this patient that may be taking medications belonging to these drug classes:  Oral GLP-1 medications will be held 1 day prior to surgery.  Injectable GLP-1 medications will be held 7 days prior to surgery.  Metformin is routinely held 48 hours prior to surgery due to renal concerns, potential need for contrasted imaging perioperatively, and the potential for  tissue hypoxia leading to drug induced lactic acidosis.  All SGLT2i medications are held 72 hours prior to surgery as they can be associated with the increased potential for developing euglycemic diabetic ketoacidosis (EDKA).   Impression and Plan:  George Daniels is on a prescribed GLP-1 medication, which induces the known side effect of decreased gastric emptying. Efforts are bring made to mitigate the risk of perioperative hyperglycemic events, as elevated blood glucose levels have been found to contribute to intra/postoperative complications. Additionally, hyperglycemic extremes can potentially necessitate the postponing of a patient's elective case in order to better optimize perioperative glycemic control, again with the aforementioned guidelines in place. With this in mind, recommendations have been sought from the prescribing provider, who has cleared patient to proceed with holding the prescribed GLP-1 as per the guidelines from the ASA.   Provider recommending: no further recommendations received from the prescribing provider.  Copy of signed clearance and recommendations placed on patient's chart for inclusion in their medical record and for review by the surgical/anesthetic team on the day of his procedure.   Quentin Mulling, MSN, APRN, FNP-C, CEN [facility]  Peri-operative Services Nurse Practitioner Phone: [phone] 12/05/22 2:29 PM  NOTE: This note has been prepared using Dragon dictation software. Despite my best ability to proofread, there is always the potential that unintentional transcriptional errors may still occur from this process.

## 2022-12-08 MED ORDER — ORAL CARE MOUTH RINSE: 15.0000 mL | Freq: Once | OROMUCOSAL | Status: AC

## 2022-12-08 MED ORDER — CHLORHEXIDINE GLUCONATE 0.12 % MT SOLN
15.0000 mL | Freq: Once | OROMUCOSAL | Status: AC
  Administered 2022-12-09: 15 mL via OROMUCOSAL

## 2022-12-08 MED ORDER — SODIUM CHLORIDE 0.9 % IV SOLN: INTRAVENOUS | Status: DC

## 2022-12-08 MED ORDER — CEFAZOLIN SODIUM-DEXTROSE 2-4 GM/100ML-% IV SOLN
2.0000 g | INTRAVENOUS | Status: AC
  Administered 2022-12-09: 2 g via INTRAVENOUS

## 2022-12-09 ENCOUNTER — Ambulatory Visit: Payer: Medicare HMO | Admitting: Urgent Care

## 2022-12-09 ENCOUNTER — Other Ambulatory Visit: Payer: Self-pay

## 2022-12-09 ENCOUNTER — Ambulatory Visit
Admission: RE | Admit: 2022-12-09 | Discharge: 2022-12-09 | Disposition: A | Discharge status: Home / Self Care | Payer: Medicare HMO | Attending: Surgery | Admitting: Surgery

## 2022-12-09 ENCOUNTER — Ambulatory Visit: Payer: Medicare HMO

## 2022-12-09 ENCOUNTER — Encounter
Admission: RE | Disposition: A | Discharge status: Home / Self Care | Payer: Self-pay | Source: Home / Self Care | Attending: Surgery

## 2022-12-09 ENCOUNTER — Encounter: Payer: Self-pay | Admitting: Surgery

## 2022-12-09 DIAGNOSIS — E669 Obesity, unspecified: Secondary | ICD-10-CM | POA: Diagnosis not present

## 2022-12-09 DIAGNOSIS — Z86718 Personal history of other venous thrombosis and embolism: Secondary | ICD-10-CM | POA: Insufficient documentation

## 2022-12-09 DIAGNOSIS — I110 Hypertensive heart disease with heart failure: Secondary | ICD-10-CM | POA: Insufficient documentation

## 2022-12-09 DIAGNOSIS — M25562 Pain in left knee: Secondary | ICD-10-CM | POA: Diagnosis not present

## 2022-12-09 DIAGNOSIS — S83232A Complex tear of medial meniscus, current injury, left knee, initial encounter: Secondary | ICD-10-CM | POA: Diagnosis not present

## 2022-12-09 DIAGNOSIS — I509 Heart failure, unspecified: Secondary | ICD-10-CM | POA: Insufficient documentation

## 2022-12-09 DIAGNOSIS — Z7901 Long term (current) use of anticoagulants: Secondary | ICD-10-CM | POA: Diagnosis not present

## 2022-12-09 DIAGNOSIS — Z7985 Long-term (current) use of injectable non-insulin antidiabetic drugs: Secondary | ICD-10-CM | POA: Diagnosis not present

## 2022-12-09 DIAGNOSIS — G2581 Restless legs syndrome: Secondary | ICD-10-CM | POA: Insufficient documentation

## 2022-12-09 DIAGNOSIS — M170 Bilateral primary osteoarthritis of knee: Secondary | ICD-10-CM | POA: Insufficient documentation

## 2022-12-09 DIAGNOSIS — D869 Sarcoidosis, unspecified: Secondary | ICD-10-CM | POA: Insufficient documentation

## 2022-12-09 DIAGNOSIS — K219 Gastro-esophageal reflux disease without esophagitis: Secondary | ICD-10-CM | POA: Insufficient documentation

## 2022-12-09 DIAGNOSIS — E119 Type 2 diabetes mellitus without complications: Secondary | ICD-10-CM | POA: Insufficient documentation

## 2022-12-09 DIAGNOSIS — K760 Fatty (change of) liver, not elsewhere classified: Secondary | ICD-10-CM | POA: Diagnosis not present

## 2022-12-09 DIAGNOSIS — Z471 Aftercare following joint replacement surgery: Secondary | ICD-10-CM | POA: Diagnosis not present

## 2022-12-09 DIAGNOSIS — I2510 Atherosclerotic heart disease of native coronary artery without angina pectoris: Secondary | ICD-10-CM | POA: Diagnosis not present

## 2022-12-09 DIAGNOSIS — Z96652 Presence of left artificial knee joint: Secondary | ICD-10-CM | POA: Diagnosis not present

## 2022-12-09 DIAGNOSIS — X58XXXA Exposure to other specified factors, initial encounter: Secondary | ICD-10-CM | POA: Insufficient documentation

## 2022-12-09 DIAGNOSIS — I4510 Unspecified right bundle-branch block: Secondary | ICD-10-CM | POA: Insufficient documentation

## 2022-12-09 DIAGNOSIS — Z6839 Body mass index (BMI) 39.0-39.9, adult: Secondary | ICD-10-CM | POA: Insufficient documentation

## 2022-12-09 DIAGNOSIS — I700 Atherosclerosis of aorta: Secondary | ICD-10-CM | POA: Diagnosis not present

## 2022-12-09 DIAGNOSIS — Z01818 Encounter for other preprocedural examination: Secondary | ICD-10-CM

## 2022-12-09 DIAGNOSIS — M1712 Unilateral primary osteoarthritis, left knee: Secondary | ICD-10-CM | POA: Diagnosis not present

## 2022-12-09 DIAGNOSIS — E785 Hyperlipidemia, unspecified: Secondary | ICD-10-CM | POA: Diagnosis not present

## 2022-12-09 DIAGNOSIS — M25462 Effusion, left knee: Secondary | ICD-10-CM | POA: Diagnosis not present

## 2022-12-09 HISTORY — DX: Long term (current) use of unspecified immunomodulators and immunosuppressants: Z79.60

## 2022-12-09 HISTORY — PX: PARTIAL KNEE ARTHROPLASTY: SHX2174

## 2022-12-09 HISTORY — DX: Anxiety disorder, unspecified: F41.9

## 2022-12-09 HISTORY — DX: Insomnia, unspecified: G47.00

## 2022-12-09 HISTORY — DX: Fatty (change of) liver, not elsewhere classified: K76.0

## 2022-12-09 HISTORY — DX: Other forms of dyspnea: R06.09

## 2022-12-09 HISTORY — DX: Other long term (current) drug therapy: Z79.899

## 2022-12-09 HISTORY — DX: Long term (current) use of anticoagulants: Z79.01

## 2022-12-09 HISTORY — DX: Male erectile dysfunction, unspecified: N52.9

## 2022-12-09 HISTORY — DX: Spinal stenosis, lumbar region without neurogenic claudication: M48.061

## 2022-12-09 LAB — GLUCOSE, CAPILLARY: Glucose-Capillary: 135 mg/dL — ABNORMAL HIGH (ref 70–99)

## 2022-12-09 SURGERY — ARTHROPLASTY, KNEE, UNICOMPARTMENTAL
Anesthesia: Spinal | Site: Knee | Laterality: Left

## 2022-12-09 MED ORDER — CHLORHEXIDINE GLUCONATE 0.12 % MT SOLN
OROMUCOSAL | Status: AC
  Filled 2022-12-09: qty 15

## 2022-12-09 MED ORDER — TRIAMCINOLONE ACETONIDE 40 MG/ML IJ SUSP
INTRAMUSCULAR | Status: AC
  Filled 2022-12-09: qty 1

## 2022-12-09 MED ORDER — ACETAMINOPHEN 325 MG PO TABS: 325.0000 mg | ORAL_TABLET | Freq: Four times a day (QID) | ORAL | Status: DC | PRN

## 2022-12-09 MED ORDER — SODIUM CHLORIDE 0.9 % IR SOLN
Status: DC | PRN
  Administered 2022-12-09: 3000 mL

## 2022-12-09 MED ORDER — SODIUM CHLORIDE 0.9 % IV BOLUS
250.0000 mL | Freq: Once | INTRAVENOUS | Status: AC
  Administered 2022-12-09: 250 mL via INTRAVENOUS

## 2022-12-09 MED ORDER — ONDANSETRON HCL 4 MG PO TABS: 4.0000 mg | ORAL_TABLET | Freq: Four times a day (QID) | ORAL | Status: DC | PRN

## 2022-12-09 MED ORDER — MIDAZOLAM HCL 2 MG/2ML IJ SOLN
INTRAMUSCULAR | Status: AC
  Filled 2022-12-09: qty 2

## 2022-12-09 MED ORDER — METOCLOPRAMIDE HCL 10 MG PO TABS: 5.0000 mg | ORAL_TABLET | Freq: Three times a day (TID) | ORAL | Status: DC | PRN

## 2022-12-09 MED ORDER — LIDOCAINE HCL (CARDIAC) PF 100 MG/5ML IV SOSY
PREFILLED_SYRINGE | INTRAVENOUS | Status: DC | PRN
  Administered 2022-12-09: 40 mg via INTRAVENOUS

## 2022-12-09 MED ORDER — FENTANYL CITRATE (PF) 100 MCG/2ML IJ SOLN: 25.0000 ug | INTRAMUSCULAR | Status: DC | PRN

## 2022-12-09 MED ORDER — KETOROLAC TROMETHAMINE 15 MG/ML IJ SOLN
INTRAMUSCULAR | Status: AC
  Filled 2022-12-09: qty 1

## 2022-12-09 MED ORDER — FENTANYL CITRATE (PF) 100 MCG/2ML IJ SOLN
INTRAMUSCULAR | Status: AC
  Filled 2022-12-09: qty 2

## 2022-12-09 MED ORDER — OXYCODONE HCL 5 MG PO TABS
5.0000 mg | ORAL_TABLET | Freq: Once | ORAL | Status: AC | PRN
  Administered 2022-12-09: 5 mg via ORAL

## 2022-12-09 MED ORDER — PHENYLEPHRINE HCL-NACL 20-0.9 MG/250ML-% IV SOLN
INTRAVENOUS | Status: DC | PRN
  Administered 2022-12-09: 10 ug/min via INTRAVENOUS

## 2022-12-09 MED ORDER — BUPIVACAINE HCL (PF) 0.5 % IJ SOLN
INTRAMUSCULAR | Status: DC | PRN
  Administered 2022-12-09: 2.8 mL

## 2022-12-09 MED ORDER — ONDANSETRON HCL 4 MG/2ML IJ SOLN
INTRAMUSCULAR | Status: DC | PRN
  Administered 2022-12-09: 4 mg via INTRAVENOUS

## 2022-12-09 MED ORDER — CEFAZOLIN SODIUM-DEXTROSE 2-4 GM/100ML-% IV SOLN
INTRAVENOUS | Status: AC
  Filled 2022-12-09: qty 100

## 2022-12-09 MED ORDER — PROPOFOL 10 MG/ML IV BOLUS
INTRAVENOUS | Status: DC | PRN
  Administered 2022-12-09: 30 mg via INTRAVENOUS
  Administered 2022-12-09: 40 mg via INTRAVENOUS

## 2022-12-09 MED ORDER — BUPIVACAINE HCL (PF) 0.5 % IJ SOLN
INTRAMUSCULAR | Status: AC
  Filled 2022-12-09: qty 30

## 2022-12-09 MED ORDER — METOCLOPRAMIDE HCL 5 MG/ML IJ SOLN: 5.0000 mg | Freq: Three times a day (TID) | INTRAMUSCULAR | Status: DC | PRN

## 2022-12-09 MED ORDER — FENTANYL CITRATE (PF) 100 MCG/2ML IJ SOLN
INTRAMUSCULAR | Status: DC | PRN
  Administered 2022-12-09 (×4): 25 ug via INTRAVENOUS

## 2022-12-09 MED ORDER — PROPOFOL 10 MG/ML IV BOLUS
INTRAVENOUS | Status: AC
  Filled 2022-12-09: qty 20

## 2022-12-09 MED ORDER — TRANEXAMIC ACID-NACL 1000-0.7 MG/100ML-% IV SOLN
INTRAVENOUS | Status: DC | PRN
  Administered 2022-12-09: 1000 mg via INTRAVENOUS

## 2022-12-09 MED ORDER — KETOROLAC TROMETHAMINE 15 MG/ML IJ SOLN
15.0000 mg | Freq: Once | INTRAMUSCULAR | Status: AC
  Administered 2022-12-09: 15 mg via INTRAVENOUS

## 2022-12-09 MED ORDER — OXYCODONE HCL 5 MG/5ML PO SOLN: 5.0000 mg | Freq: Once | ORAL | Status: AC | PRN

## 2022-12-09 MED ORDER — EPINEPHRINE PF 1 MG/ML IJ SOLN
INTRAMUSCULAR | Status: AC
  Filled 2022-12-09: qty 1

## 2022-12-09 MED ORDER — OXYCODONE HCL 5 MG PO TABS
ORAL_TABLET | ORAL | Status: AC
  Filled 2022-12-09: qty 1

## 2022-12-09 MED ORDER — TRANEXAMIC ACID-NACL 1000-0.7 MG/100ML-% IV SOLN
INTRAVENOUS | Status: AC
  Filled 2022-12-09: qty 100

## 2022-12-09 MED ORDER — PHENYLEPHRINE HCL-NACL 20-0.9 MG/250ML-% IV SOLN
INTRAVENOUS | Status: AC
  Filled 2022-12-09: qty 250

## 2022-12-09 MED ORDER — BUPIVACAINE LIPOSOME 1.3 % IJ SUSP
INTRAMUSCULAR | Status: AC
  Filled 2022-12-09: qty 20

## 2022-12-09 MED ORDER — SODIUM CHLORIDE 0.9 % IV SOLN: INTRAVENOUS | Status: DC

## 2022-12-09 MED ORDER — MIDAZOLAM HCL 5 MG/5ML IJ SOLN
INTRAMUSCULAR | Status: DC | PRN
  Administered 2022-12-09: 2 mg via INTRAVENOUS

## 2022-12-09 MED ORDER — OXYCODONE HCL 5 MG PO TABS
5.0000 mg | ORAL_TABLET | ORAL | 0 refills | Status: DC | PRN
Start: 2022-12-09 — End: 2023-10-28

## 2022-12-09 MED ORDER — CEFAZOLIN SODIUM-DEXTROSE 2-4 GM/100ML-% IV SOLN
2.0000 g | Freq: Four times a day (QID) | INTRAVENOUS | Status: DC
  Administered 2022-12-09: 2 g via INTRAVENOUS

## 2022-12-09 MED ORDER — TRIAMCINOLONE ACETONIDE 40 MG/ML IJ SUSP
INTRAMUSCULAR | Status: DC | PRN
  Administered 2022-12-09: 63 mL via INTRAMUSCULAR

## 2022-12-09 MED ORDER — LIDOCAINE HCL (PF) 2 % IJ SOLN
INTRAMUSCULAR | Status: AC
  Filled 2022-12-09: qty 5

## 2022-12-09 MED ORDER — ONDANSETRON HCL 4 MG/2ML IJ SOLN
4.0000 mg | Freq: Four times a day (QID) | INTRAMUSCULAR | Status: DC | PRN
  Administered 2022-12-09: 4 mg via INTRAVENOUS

## 2022-12-09 MED ORDER — OXYCODONE HCL 5 MG PO TABS: 5.0000 mg | ORAL_TABLET | ORAL | Status: DC | PRN

## 2022-12-09 MED ORDER — SODIUM CHLORIDE FLUSH 0.9 % IV SOLN
INTRAVENOUS | Status: AC
  Filled 2022-12-09: qty 10

## 2022-12-09 MED ORDER — 0.9 % SODIUM CHLORIDE (POUR BTL) OPTIME
TOPICAL | Status: DC | PRN
  Administered 2022-12-09: 500 mL

## 2022-12-09 MED ORDER — PROPOFOL 500 MG/50ML IV EMUL
INTRAVENOUS | Status: DC | PRN
  Administered 2022-12-09: 100 ug/kg/min via INTRAVENOUS

## 2022-12-09 MED ORDER — PROPOFOL 1000 MG/100ML IV EMUL
INTRAVENOUS | Status: AC
  Filled 2022-12-09: qty 100

## 2022-12-09 MED ORDER — ONDANSETRON HCL 4 MG/2ML IJ SOLN
INTRAMUSCULAR | Status: AC
  Filled 2022-12-09: qty 2

## 2022-12-09 MED ORDER — SODIUM CHLORIDE 0.9 % BOLUS PEDS
250.0000 mL | Freq: Once | INTRAVENOUS | Status: AC
  Administered 2022-12-09: 250 mL via INTRAVENOUS

## 2022-12-09 SURGICAL SUPPLY — 65 items
APL PRP STRL LF DISP 70% ISPRP (MISCELLANEOUS) ×2
BEARING TIBIAL OXFORD MED 4 (Orthopedic Implant) IMPLANT
BIT DRILL QUICK REL 1/8 2PK SL (BIT) IMPLANT
BNDG CMPR 6 X 5 YARDS HK CLSR (GAUZE/BANDAGES/DRESSINGS) ×1
BNDG ELASTIC 6INX 5YD STR LF (GAUZE/BANDAGES/DRESSINGS) ×1 IMPLANT
BRNG TIB B UNCMP STRL LM/RL (Joint) ×1 IMPLANT
BRNG TIB MED 4 PHS 3 LT MEN (Orthopedic Implant) ×1 IMPLANT
CEMENT BONE R 1X40 (Cement) ×1 IMPLANT
CEMENT VACUUM MIXING SYSTEM (MISCELLANEOUS) ×1 IMPLANT
CHLORAPREP W/TINT 26 (MISCELLANEOUS) ×2 IMPLANT
COOLER POLAR GLACIER W/PUMP (MISCELLANEOUS) ×1 IMPLANT
COVER MAYO STAND STRL (DRAPES) ×1 IMPLANT
CUFF TOURN SGL QUICK 24 (TOURNIQUET CUFF)
CUFF TOURN SGL QUICK 34 (TOURNIQUET CUFF) ×1
CUFF TRNQT CYL 24X4X16.5-23 (TOURNIQUET CUFF) IMPLANT
CUFF TRNQT CYL 34X4.125X (TOURNIQUET CUFF) IMPLANT
DRAPE C-ARM XRAY 36X54 (DRAPES) IMPLANT
DRAPE U-SHAPE 47X51 STRL (DRAPES) ×2 IMPLANT
DRSG MEPILEX SACRM 8.7X9.8 (GAUZE/BANDAGES/DRESSINGS) ×1 IMPLANT
DRSG OPSITE POSTOP 4X12 (GAUZE/BANDAGES/DRESSINGS) ×1 IMPLANT
DRSG OPSITE POSTOP 4X6 (GAUZE/BANDAGES/DRESSINGS) ×1 IMPLANT
ELECT CAUTERY BLADE 6.4 (BLADE) ×1 IMPLANT
ELECT REM PT RETURN 9FT ADLT (ELECTROSURGICAL) ×1
ELECTRODE REM PT RTRN 9FT ADLT (ELECTROSURGICAL) ×1 IMPLANT
GAUZE SPONGE 4X4 12PLY STRL (GAUZE/BANDAGES/DRESSINGS) ×1 IMPLANT
GAUZE XEROFORM 1X8 LF (GAUZE/BANDAGES/DRESSINGS) ×1 IMPLANT
GLOVE BIO SURGEON STRL SZ7.5 (GLOVE) ×4 IMPLANT
GLOVE BIO SURGEON STRL SZ8 (GLOVE) ×4 IMPLANT
GLOVE BIOGEL PI IND STRL 8 (GLOVE) ×1 IMPLANT
GOWN STRL REUS W/ TWL LRG LVL3 (GOWN DISPOSABLE) ×1 IMPLANT
GOWN STRL REUS W/ TWL XL LVL3 (GOWN DISPOSABLE) ×1 IMPLANT
GOWN STRL REUS W/TWL LRG LVL3 (GOWN DISPOSABLE) ×1
GOWN STRL REUS W/TWL XL LVL3 (GOWN DISPOSABLE) ×1
HOOD PEEL AWAY T7 (MISCELLANEOUS) ×3 IMPLANT
INSERT TIBIAL OXFORD SZ B LF (Joint) IMPLANT
IV NS IRRIG 3000ML ARTHROMATIC (IV SOLUTION) ×1 IMPLANT
KIT TURNOVER KIT A (KITS) ×1 IMPLANT
MANIFOLD NEPTUNE II (INSTRUMENTS) ×1 IMPLANT
MAT ABSORB FLUID 56X50 GRAY (MISCELLANEOUS) ×1 IMPLANT
NDL SAFETY ECLIP 18X1.5 (MISCELLANEOUS) ×1 IMPLANT
NDL SPNL 20GX3.5 QUINCKE YW (NEEDLE) ×1 IMPLANT
NEEDLE SPNL 20GX3.5 QUINCKE YW (NEEDLE) ×1 IMPLANT
NS IRRIG 1000ML POUR BTL (IV SOLUTION) ×1 IMPLANT
NS IRRIG 500ML POUR BTL (IV SOLUTION) IMPLANT
PACK BLADE SAW RECIP 70 3 PT (BLADE) IMPLANT
PACK TOTAL KNEE (MISCELLANEOUS) ×1 IMPLANT
PAD ABD DERMACEA PRESS 5X9 (GAUZE/BANDAGES/DRESSINGS) ×2 IMPLANT
PAD WRAPON POLAR KNEE (MISCELLANEOUS) ×1 IMPLANT
PEG TWIN FEM CEMENTED MED (Knees) IMPLANT
PENCIL SMOKE EVACUATOR (MISCELLANEOUS) IMPLANT
PULSAVAC PLUS IRRIG FAN TIP (DISPOSABLE) ×1
STAPLER SKIN PROX 35W (STAPLE) ×1 IMPLANT
STRAP SAFETY 5IN WIDE (MISCELLANEOUS) ×1 IMPLANT
SUCTION TUBE FRAZIER 10FR DISP (SUCTIONS) ×1 IMPLANT
SUT VIC AB 0 CT1 36 (SUTURE) ×1 IMPLANT
SUT VIC AB 2-0 CT1 27 (SUTURE) ×2
SUT VIC AB 2-0 CT1 TAPERPNT 27 (SUTURE) ×4 IMPLANT
SYR 10ML LL (SYRINGE) ×1 IMPLANT
SYR 30ML LL (SYRINGE) ×2 IMPLANT
TAPE TRANSPORE STRL 2 31045 (GAUZE/BANDAGES/DRESSINGS) ×1 IMPLANT
TIP FAN IRRIG PULSAVAC PLUS (DISPOSABLE) ×1 IMPLANT
TRAP FLUID SMOKE EVACUATOR (MISCELLANEOUS) ×1 IMPLANT
WATER STERILE IRR 1000ML POUR (IV SOLUTION) IMPLANT
WATER STERILE IRR 500ML POUR (IV SOLUTION) ×1 IMPLANT
WRAPON POLAR PAD KNEE (MISCELLANEOUS) ×1

## 2022-12-09 NOTE — Anesthesia Preprocedure Evaluation (Signed)
Anesthesia Evaluation  Patient identified by MRN, date of birth, ID band Patient awake    Reviewed: Allergy & Precautions, H&P , NPO status , Patient's Chart, lab work & pertinent test results  History of Anesthesia Complications (+) Family history of anesthesia reaction and history of anesthetic complications  Airway Mallampati: III  TM Distance: >3 FB Neck ROM: full    Dental  (+) Chipped, Dental Advidsory Given   Pulmonary neg pulmonary ROS   Pulmonary exam normal        Cardiovascular Exercise Tolerance: Good hypertension, +CHF  Normal cardiovascular exam     Neuro/Psych  PSYCHIATRIC DISORDERS Anxiety Depression    negative neurological ROS     GI/Hepatic Neg liver ROS,GERD  Medicated and Controlled,,  Endo/Other  negative endocrine ROSdiabetes, Type 2    Renal/GU      Musculoskeletal   Abdominal   Peds  Hematology negative hematology ROS (+)   Anesthesia Other Findings Past Medical History: No date: Anxiety     Comment:  a.) on BZO PRN (alprazolam) No date: Aortic atherosclerosis (HCC) No date: Arthritis 10/28/2022: Ascending aorta dilatation (HCC)     Comment:  a.) cCTA 10/28/2022: asc Ao ectatic measuring 3.9 cm               with no discrete aneurysm No date: Cataract cortical, senile No date: Chronic bilateral low back pain, unspecified whether  sciatica present No date: Complication of anesthesia     Comment:  a.) PONV 10/28/2022: Coronary artery disease involving native coronary artery  of native heart without angina pectoris     Comment:  a.) cCTA 10/28/2022: Ca2+ 135 (39th %'ile for               age/sex/race matched control); calcifications in LAD and               RCA territories; c.) ECG stress 11/13/2022: normal 12/19/2021: Deep vein thrombosis (DVT) of popliteal vein of right  lower extremity (HCC) 09/10/2017: Deep venous thrombosis (DVT) of right peroneal vein (HCC) No date:  Depression 12/01/2019: Diastolic dysfunction     Comment:  a.) TTE 12/01/2019: EF >55%, mild RAE, triv AR/MR/PR,               mild TR, RVSP 31.6, G1DD No date: DM (diabetes mellitus), type 2 (HCC) No date: DOE (dyspnea on exertion) No date: ED (erectile dysfunction)     Comment:  a.) on PDE5i (tadalafil) No date: Family history of adverse reaction to anesthesia     Comment:  a.) postoperative nausea in 1st degree relative               (daughter) No date: GERD (gastroesophageal reflux disease) No date: Hemorrhoids No date: Hepatic steatosis No date: History of chicken pox No date: Hyperlipidemia No date: Hypertension No date: Insomnia     Comment:  a.) uses trazodone PRN No date: Long term current use of immunosuppressive drug     Comment:  a.) on azothiaprine for pulmonary sarcoidosis No date: Lumbar spinal stenosis No date: Memory loss, short term No date: Obesity No date: On rivaroxaban therapy No date: PONV (postoperative nausea and vomiting) No date: Primary osteoarthritis of right knee No date: Pulmonary nodules No date: RBBB (right bundle branch block) No date: RLS (restless legs syndrome)     Comment:  a.) takes pramipexole No date: Sarcoidosis     Comment:  a.) followed by PCCM; b.) Tx'd now with azothiaprine               (  formerly on prednisone and MTX) No date: Severe obesity (BMI 35.0-39.9) with comorbidity (HCC) No date: Sleep apnea     Comment:  a.) does not require nocturnal PAP therapy 09/10/2017: Superficial thrombophlebitis     Comment:  a.) RIGHT GSV in upper thigh extending down to calf  Past Surgical History: 2014: ACHILLES TENDON REPAIR; Right No date: CATARACT EXTRACTION, BILATERAL 11/23/2019: COLONOSCOPY WITH PROPOFOL; N/A     Comment:  Procedure: COLONOSCOPY WITH PROPOFOL;  Surgeon: [place],               Boykin Nearing, MD;  Location: [facility];  Service:               Gastroenterology;  Laterality: N/A; 03/31/2018: EXCISION MASS NECK; Right      Comment:  Procedure: EXCISION MASS NECK;  Surgeon: Geanie Logan,               MD;  Location: [facility] ORS;  Service: ENT;  Laterality:               Right; No date: EYE SURGERY; Bilateral     Comment:  cataracts 12/09/2018: KNEE ARTHROSCOPY WITH MEDIAL MENISECTOMY; Right     Comment:  Procedure: KNEE ARTHROSCOPY;  Surgeon: Christena Flake,               MD;  Location: [facility] ORS;  Service: Orthopedics;                Laterality: Right; 08/02/2015: SHOULDER ARTHROSCOPY WITH OPEN ROTATOR CUFF REPAIR; Right     Comment:  Procedure: SHOULDER ARTHROSCOPY WITH LIMITED               DEBRIDEMENT,  MINI OPEN ROTATOR CUFF REPAIR,               DECOMPRESSION;  Surgeon: Christena Flake, MD;  Location:               [facility] ORS;  Service: Orthopedics;  Laterality: Right; 10/02/2016: TENNIS ELBOW RELEASE/NIRSCHEL PROCEDURE; Right     Comment:  Procedure: TENNIS ELBOW / OPEN DEBRIDMENT OF THE COMMON               EXTENSOR ORGIN OF RIGHT ELBOW;  Surgeon: Christena Flake,               MD;  Location: [facility] ORS;  Service: Orthopedics;                Laterality: Right; No date: TONSILLECTOMY     Comment:  as a child  BMI    Body Mass Index: 35.07 kg/m      Reproductive/Obstetrics negative OB ROS                             Anesthesia Physical Anesthesia Plan  ASA: 3  Anesthesia Plan: Spinal   Post-op Pain Management:    Induction:   PONV Risk Score and Plan: 2 and Ondansetron, Midazolam, Propofol infusion and TIVA  Airway Management Planned: Natural Airway and Nasal Cannula  Additional Equipment:   Intra-op Plan:   Post-operative Plan:   Informed Consent: I have reviewed the patients History and Physical, chart, labs and discussed the procedure including the risks, benefits and alternatives for the proposed anesthesia with the patient or authorized representative who has indicated his/her understanding and acceptance.     Dental Advisory Given  Plan Discussed with:  Anesthesiologist, CRNA and Surgeon  Anesthesia Plan Comments: (Patient  reports no bleeding problems and no anticoagulant use.  Plan for spinal with backup GA  Patient consented for risks of anesthesia including but not limited to:  - adverse reactions to medications - damage to eyes, teeth, lips or other oral mucosa - nerve damage due to positioning  - risk of bleeding, infection and or nerve damage from spinal that could lead to paralysis - risk of headache or failed spinal - damage to teeth, lips or other oral mucosa - sore throat or hoarseness - damage to heart, brain, nerves, lungs, other parts of body or loss of life  Patient voiced understanding.)       Anesthesia Quick Evaluation

## 2022-12-09 NOTE — Anesthesia Postprocedure Evaluation (Deleted)
Anesthesia Post Note  Patient: George Daniels  Procedure(s) Performed: LEFT PARTIAL KNEE REPLACEMENT. (Left: Knee)  Patient location during evaluation: PACU Anesthesia Type: Spinal Level of consciousness: awake and alert Pain management: pain level controlled Vital Signs Assessment: post-procedure vital signs reviewed and stable Respiratory status: spontaneous breathing, nonlabored ventilation, respiratory function stable and patient connected to nasal cannula oxygen Cardiovascular status: blood pressure returned to baseline and stable Postop Assessment: no apparent nausea or vomiting Anesthetic complications: no  No notable events documented.   Last Vitals:  Vitals:   12/09/22 1000 12/09/22 1015  BP: (!) 113/58 (!) 113/46  Pulse: 61 (!) 58  Resp: 14 16  Temp:    SpO2: 91% 94%    Last Pain:  Vitals:   12/09/22 1015  TempSrc:   PainSc: 0-No pain                 Stephanie Coup

## 2022-12-09 NOTE — Transfer of Care (Signed)
Immediate Anesthesia Transfer of Care Note  Patient: George Daniels  Procedure(s) Performed: LEFT PARTIAL KNEE REPLACEMENT. (Left: Knee)  Patient Location: PACU  Anesthesia Type:Spinal  Level of Consciousness: drowsy  Airway & Oxygen Therapy: Patient Spontanous Breathing and Patient connected to face mask oxygen  Post-op Assessment: Report given to RN and Post -op Vital signs reviewed and stable  Post vital signs: Reviewed and stable  Last Vitals:  Vitals Value Taken Time  BP 109/59 12/09/22 0949  Temp    Pulse 62 12/09/22 0952  Resp 17 12/09/22 0952  SpO2 96 % 12/09/22 0952  Vitals shown include unfiled device data.  Last Pain:  Vitals:   12/09/22 0635  TempSrc: Temporal  PainSc: 2       Patients Stated Pain Goal: 0 (12/09/22 5284)  Complications: No notable events documented.

## 2022-12-09 NOTE — Anesthesia Procedure Notes (Signed)
Spinal  Patient location during procedure: OR Start time: 12/09/2022 7:40 AM End time: 12/09/2022 7:40 AM Reason for block: surgical anesthesia Staffing Performed: resident/CRNA  Resident/CRNA: Monico Hoar, CRNA Performed by: Monico Hoar, CRNA Authorized by: Stephanie Coup, MD   Preanesthetic Checklist Completed: patient identified, IV checked, site marked, risks and benefits discussed, surgical consent, monitors and equipment checked, pre-op evaluation and timeout performed Spinal Block Patient position: sitting Prep: Betadine Patient monitoring: heart rate, continuous pulse ox, blood pressure and cardiac monitor Approach: midline Location: L4-5 Injection technique: single-shot Needle Needle type: Introducer and Pencan  Needle gauge: 24 G Needle length: 10 cm Assessment Sensory level: T6 Events: CSF return Additional Notes Negative paresthesia. Negative blood return. Positive free-flowing CSF. Expiration date of kit checked and confirmed. Patient tolerated procedure well, without complications.

## 2022-12-09 NOTE — Evaluation (Addendum)
Physical Therapy Evaluation Patient Details Name: George Daniels MRN: [id number] DOB: 11-04-1945 Today's Date: 12/09/2022  History of Present Illness  Pt is a 77 y/o male presenting s/p L partial knee arthroplasty. PMH includes HTN, CHF, anxiety/depression, GERD, T2D, arthritis, CAD, and DVT (2023).  Clinical Impression   Pt presents laying in bed with wife in room, 7/10 pain in L knee. He currently lives with his wife/son in a 2 story home with a level entry. PTA he was independent with all mobility/ ADLs.   PT sensation screening unremarkable. Pt able to perform bed mobility modi, sit<>stand with CGA/RW, and ambulate ~139ft with CGA/RW. PT educated on precautions booklet, polarcare donning/doffing, DME use, and car transfers at the end of session. He would benefit from continued skilled therapy to maximize functional abilities.       If plan is discharge home, recommend the following: A little help with walking and/or transfers;A little help with bathing/dressing/bathroom;Assistance with cooking/housework;Help with stairs or ramp for entrance;Assist for transportation;Direct supervision/assist for medications management   Can travel by private vehicle        Equipment Recommendations None recommended by PT  Recommendations for Other Services       Functional Status Assessment Patient has had a recent decline in their functional status and demonstrates the ability to make significant improvements in function in a reasonable and predictable amount of time.     Precautions / Restrictions Precautions Precautions: Knee Precaution Booklet Issued: Yes (comment) Restrictions Weight Bearing Restrictions: Yes LLE Weight Bearing: Weight bearing as tolerated      Mobility  Bed Mobility Overal bed mobility: Modified Independent                  Transfers Overall transfer level: Needs assistance Equipment used: Rolling walker (2 wheels) Transfers: Sit to/from Stand Sit to  Stand: Contact guard assist                Ambulation/Gait Ambulation/Gait assistance: Contact guard assist Gait Distance (Feet): 160 Feet Assistive device: Rolling walker (2 wheels) Gait Pattern/deviations: Decreased step length - right, Decreased step length - left, Decreased stance time - left Gait velocity: decreased        Stairs            Wheelchair Mobility     Tilt Bed    Modified Rankin (Stroke Patients Only)       Balance Overall balance assessment: Needs assistance Sitting-balance support: No upper extremity supported, Feet supported, Bilateral upper extremity supported Sitting balance-Leahy Scale: Good     Standing balance support: Bilateral upper extremity supported, During functional activity Standing balance-Leahy Scale: Fair                               Pertinent Vitals/Pain Pain Assessment Pain Assessment: 0-10 Pain Score: 7  Pain Location: L knee Pain Descriptors / Indicators: Discomfort, Constant Pain Intervention(s): Limited activity within patient's tolerance, Monitored during session    Home Living Family/patient expects to be discharged to:: Private residence Living Arrangements: Spouse/significant other;Children Available Help at Discharge: Family Type of Home: House Home Access: Level entry       Home Layout: Two level;Able to live on main level with bedroom/bathroom Home Equipment: Rolling Walker (2 wheels);Rollator (4 wheels);Cane - single point      Prior Function Prior Level of Function : Independent/Modified Independent             Mobility Comments: did  not use AD ADLs Comments: independent     Extremity/Trunk Assessment   Upper Extremity Assessment Upper Extremity Assessment: Overall WFL for tasks assessed    Lower Extremity Assessment Lower Extremity Assessment: RLE deficits/detail;LLE deficits/detail RLE Sensation: WNL LLE Deficits / Details: decreased mobility/ strength due to  recent procedure LLE Sensation: WNL       Communication   Communication Communication: No apparent difficulties  Cognition Arousal: Alert Behavior During Therapy: WFL for tasks assessed/performed Overall Cognitive Status: Within Functional Limits for tasks assessed                                          General Comments      Exercises     Assessment/Plan    PT Assessment Patient needs continued PT services  PT Problem List Decreased strength;Decreased range of motion;Decreased activity tolerance;Decreased balance;Decreased mobility;Decreased knowledge of use of DME;Decreased safety awareness;Decreased knowledge of precautions       PT Treatment Interventions DME instruction;Gait training;Stair training;Functional mobility training;Therapeutic activities;Therapeutic exercise;Balance training;Patient/family education    PT Goals (Current goals can be found in the Care Plan section)  Acute Rehab PT Goals Patient Stated Goal: return home PT Goal Formulation: With patient Time For Goal Achievement: 12/23/22 Potential to Achieve Goals: Good    Frequency BID     Co-evaluation               AM-PAC PT "6 Clicks" Mobility  Outcome Measure Help needed turning from your back to your side while in a flat bed without using bedrails?: None Help needed moving from lying on your back to sitting on the side of a flat bed without using bedrails?: None Help needed moving to and from a bed to a chair (including a wheelchair)?: A Little Help needed standing up from a chair using your arms (e.g., wheelchair or bedside chair)?: A Little Help needed to walk in hospital room?: A Little Help needed climbing 3-5 steps with a railing? : A Little 6 Click Score: 20    End of Session Equipment Utilized During Treatment: Gait belt Activity Tolerance: Patient tolerated treatment well Patient left: in chair;with call bell/phone within reach;with family/visitor  present Nurse Communication: Mobility status;Other (comment) (IV meds disconnected prior to session) PT Visit Diagnosis: Other abnormalities of gait and mobility (R26.89);Muscle weakness (generalized) (M62.81);Difficulty in walking, not elsewhere classified (R26.2)    Time: 4401-0272 PT Time Calculation (min) (ACUTE ONLY): 27 min   Charges:   PT Evaluation $PT Eval Low Complexity: 1 Low PT Treatments $Therapeutic Activity: 23-37 mins PT General Charges $$ ACUTE PT VISIT: 1 Visit         Lyndie Vanderloop, PT, SPT 4:02 PM,12/09/22

## 2022-12-09 NOTE — Progress Notes (Signed)
Patient is not able to walk the distance required to go the bathroom, or he/she is unable to safely negotiate stairs required to access the bathroom.  A 3in1 BSC will alleviate this problem  

## 2022-12-09 NOTE — TOC Progression Note (Signed)
Transition of Care [facility]) - Progression Note    Patient Details  Name: George Daniels MRN: [id number] Date of Birth: 03-Jul-1945  Transition of Care [facility]) CM/SW Contact  Marlowe Sax, RN Phone Number: 12/09/2022, 10:36 AM  Clinical Narrative:     The patient is set up with [facility] services prior to surgery by surgeons office 3 in 1 is to be delivered to the bedside by [employer]        Expected Discharge Plan and Services         Expected Discharge Date: 12/09/22                                     Social Determinants of Health (SDOH) Interventions SDOH Screenings   Food Insecurity: No Food Insecurity (09/10/2022)   Received from [facility], [facility]  Transportation Needs: No Transportation Needs (09/10/2022)   Received from [facility], [facility]  Utilities: Not At Risk (09/10/2022)   Received from [facility], [facility]  Depression [phone]): Low Risk  (09/13/2018)  Financial Resource Strain: Low Risk  (09/10/2022)   Received from [facility], [facility]  Tobacco Use: Low Risk  (12/09/2022)    Readmission Risk Interventions     No data to display

## 2022-12-09 NOTE — Op Note (Signed)
12/09/2022  9:50 AM  Patient:   George Daniels  Pre-Op Diagnosis:   Osteoarthritis of medial compartment, left knee.  Post-Op Diagnosis:   Same  Procedure:   Left unicondylar knee arthroplasty.  Surgeon:   Maryagnes Amos, MD  Assistant:   Horris Latino, PA-C  Anesthesia:   Spinal  Findings:   As above.  Complications:   None  EBL:   10 cc  Fluids:   400 cc crystalloid  UOP:   None  TT:   80 minutes at 300 mmHg  Drains:   None  Closure:   Staples  Implants:   All-cemented Biomet Oxford system with a medium femoral component, a "B" sized tibial tray, and a 4 mm meniscal bearing insert.  Brief Clinical Note:   The patient is a 77 year old male with a history of gradually worsening medial sided left knee pain. His symptoms have progressed despite medications, activity modification, etc. His history and examination are consistent with a complex medial meniscus tear with underlying degenerative joint disease, limited to the medial compartment as demonstrated on preoperative MRI scanning. The patient presents at this time for a left partial knee replacement.  Procedure:   The patient was brought into the operating room and a spinal placed by the anesthesiologist. The patient then was repositioned so that the non-surgical leg was placed in a flexed and abducted position in the yellow fin leg holder while the surgical extremity was placed over the Biomet leg holder. The left lower extremity was prepped with ChloraPrep solution before being draped sterilely. Preoperative antibiotics were administered. After performing a timeout to verify the appropriate surgical site, the limb was exsanguinated with an Esmarch and the tourniquet inflated to 300 mmHg.   A standard anterior approach to the knee was made through an approximately 3.5-4 inch incision. The incision was carried down through the subcutaneous tissues to expose the superficial retinaculum. This was split the length the incision  and the medial flap elevated sufficiently to expose the medial retinaculum. The medial retinaculum was incised along the medial border of the patella tendon and extended proximally along the medial border of the patella, leaving a 3-4 mm cuff of tissue. The soft tissues were elevated off the anteromedial aspect of the proximal tibia. The anterior portion of the meniscus was removed after performing a subtotal excision of the infrapatellar fat pad. The anterior cruciate ligament was inspected and found to be in excellent condition. Osteophytes were removed from the inferior pole of the patella as well as from the notch using a quarter-inch osteotome. There were moderate degenerative changes of both the femur and tibia on the medial side.   The medial femoral condyle was sized using the large and medium sizers. It was felt that the medium guide best optimized the contour of the femur. This was left in place and the external tibial guide positioned. The coupling device was used to connect the guide to the medial femoral condylar sizer to optimize appropriate orientation. Two guide pins were inserted into the cutting block before the coupling device and sizer were removed. The appropriate tibial cut was made using the oscillating and reciprocating saws. The piece was removed in its entirety and taken to the back table where it was sized and found to be optimally replicated by a "B" sized component. The 8 mm spacer was inserted to verify that sufficient bone had been removed.  Attention was directed to femoral side. The intramedullary canal was accessed through a 4 mm  drill hole. The intramedullary guide was positioned before the guide for the femoral condylar holes was positioned. The appropriate coupling device connected this guide to the intramedullary guide before both drill holes were created in the distal aspect of the medial femoral condyle. The devices were removed and the posterior condylar cutting block  inserted. The appropriate cut was made using the reciprocating saw and this piece removed. The #0 spigot was inserted and the initial bone milling performed. A trial femoral component was inserted and both the flexion and extension gaps measured. In flexion, the gap measured 7 mm whereas in extension, it measured 5 mm. Therefore, the #2 spigot was selected and the secondary bone milling performed. Repeat sizing demonstrated symmetric flexion and extension gaps. The bone was removed from the postero-medial and postero-lateral aspects of the femoral condyle, as well as from the beneath the collar of the spigot. Bone also was removed from the anterior portion of the femur so as to minimize any potential impingement with the meniscal bearing insert. The trial components removed and several drill holes placed into the distal femoral condyle to further augment cement fixation.  Attention was redirected to the tibial side. The "B" sized tibial tray was positioned and temporarily secured using the appropriate spiked nail. The keel was created using the bi-bladed reciprocating saw and hoe. The keeled "B" sized trial tibial tray was inserted to be sure that it seated properly. At this point, a total of 20 cc of Exparel, 2 cc of Kenalog 40 (80 mg), 30 mg of Toradol, and 30 cc of 0.5% Sensorcaine diluted out to 60 cc with normal saline was injected in and around the posterior and medial capsular tissues, as well as the peri-incisional tissues to help with postoperative pain control.  The bony surfaces were prepared for cementing by irrigating them thoroughly with bacitracin saline solution using the jet lavage system before packing them with a dry Ray-Tec sponge. Meanwhile, cement was being mixed on the back table. When the cement was ready, the tibial tray was cemented in first. The excess cement was removed using a Public house manager after impacting it into place. Next, the femoral component was impacted into place. Again  the excess cement was removed using a Public house manager. The 4 mm spacer was inserted and the knee brought into near full extension while the cement hardened. Once the cement hardened, the spacer was removed and the 4 mm meniscal bearing insert was trialed. This demonstrated excellent tracking while the knee was placed through a range of motion, and showed no evidence towards subluxation or dislocation. In addition, it did not fit too tightly. Therefore, the permanent 4 mm meniscal bearing insert was snapped into position after verifying that no cement had been retained posteriorly. Again the knee was placed through a range of motion with the findings as described above.  The wound was copiously irrigated with sterile saline solution via the jet lavage system before the retinacular layer was reapproximated using #0 Vicryl interrupted sutures. The subcutaneous tissues were closed in two layers using 2-0 Vicryl interrupted sutures before the skin was closed using staples. A sterile occlusive dressing was applied to the knee before the patient was awakened. The patient was transferred back to his hospital bed and returned to the recovery room in satisfactory condition after tolerating the procedure well. A Polar Care device was applied to the knee as well.

## 2022-12-09 NOTE — Discharge Instructions (Addendum)
Orthopedic discharge instructions: May shower with intact OpSite dressing. Apply ice frequently to knee or use Polar Care. Resume Xarelto on Wednesday, 12/10/2022. Take pain medication as prescribed when needed.  May supplement with ES Tylenol if necessary. May weight-bear as tolerated on left leg - use walker for balance and support. Follow-up in 10-14 days or as scheduled.      AMBULATORY SURGERY  DISCHARGE INSTRUCTIONS   The drugs that you were given will stay in your system until tomorrow so for the next 24 hours you should not:  Drive an automobile Make any legal decisions Drink any alcoholic beverage   You may resume regular meals tomorrow.  Today it is better to start with liquids and gradually work up to solid foods.  You may eat anything you prefer, but it is better to start with liquids, then soup and crackers, and gradually work up to solid foods.   Please notify your doctor immediately if you have any unusual bleeding, trouble breathing, redness and pain at the surgery site, drainage, fever, or pain not relieved by medication.   Your post-operative visit with Dr.                                     is: Date:                        Time:    Please call to schedule your post-operative visit.  Additional Instructions: PLEASE LEAVE EXPAREL (TEAL) ARMBAND ON FOR 4 DAYS  POLAR CARE INFORMATION  MassAdvertisement.it  How to use [employer] Polar Care [facility]?  YouTube   ShippingScam.co.uk  OPERATING INSTRUCTIONS  Start the product With dry hands, connect the transformer to the electrical connection located on the top of the cooler. Next, plug the transformer into an appropriate electrical outlet. The unit will automatically start running at this point.  To stop the pump, disconnect electrical power.  Unplug to stop the product when not in use. Unplugging the Polar Care unit turns it off. Always unplug immediately after use. Never  leave it plugged in while unattended. Remove pad.    FIRST ADD WATER TO FILL LINE, THEN ICE---Replace ice when existing ice is almost melted  1 Discuss Treatment with your Licensed Health Care Practitioner and Use Only as Prescribed 2 Apply Insulation Barrier & Cold Therapy Pad 3 Check for Moisture 4 Inspect Skin Regularly  Tips and Trouble Shooting Usage Tips 1. Use cubed or chunked ice for optimal performance. 2. It is recommended to drain the Pad between uses. To drain the pad, hold the Pad upright with the hose pointed toward the ground. Depress the black plunger and allow water to drain out. 3. You may disconnect the Pad from the unit without removing the pad from the affected area by depressing the silver tabs on the hose coupling and gently pulling the hoses apart. The Pad and unit will seal itself and will not leak. Note: Some dripping during release is normal. 4. DO NOT RUN PUMP WITHOUT WATER! The pump in this unit is designed to run with water. Running the unit without water will cause permanent damage to the pump. 5. Unplug unit before removing lid.  TROUBLESHOOTING GUIDE Pump not running, Water not flowing to the pad, Pad is not getting cold 1. Make sure the transformer is plugged into the wall outlet. 2. Confirm that the ice  and water are filled to the indicated levels. 3. Make sure there are no kinks in the pad. 4. Gently pull on the blue tube to make sure the tube/pad junction is straight. 5. Remove the pad from the treatment site and ll it while the pad is lying at; then reapply. 6. Confirm that the pad couplings are securely attached to the unit. Listen for the double clicks (Figure 1) to confirm the pad couplings are securely attached.  Leaks    Note: Some condensation on the lines, controller, and pads is unavoidable, especially in warmer climates. 1. If using a Breg Polar Care Cold Therapy unit with a detachable Cold Therapy Pad, and a leak exists (other than  condensation on the lines) disconnect the pad couplings. Make sure the silver tabs on the couplings are depressed before reconnecting the pad to the pump hose; then confirm both sides of the coupling are properly clicked in. 2. If the coupling continues to leak or a leak is detected in the pad itself, stop using it and call [employer] Customer Care at [phone].  Cleaning After use, empty and dry the unit with a soft cloth. Warm water and mild detergent may be used occasionally to clean the pump and tubes.  WARNING: The Polar Care Cube can be cold enough to cause serious injury, including full skin necrosis. Follow these Operating Instructions, and carefully read the Product Insert (see pouch on side of unit) and the Cold Therapy Pad Fitting Instructions (provided with each Cold Therapy Pad) prior to use.

## 2022-12-09 NOTE — H&P (Signed)
History of Present Illness: George Daniels is a 77 y.o. male who presents today for his surgical history and physical for upcoming left partial knee arthroplasty scheduled with Dr. Joice Lofts on 12/09/22. Overall the patient feels that he is doing well at today's visit. He denies any changes in his medical history since he was last evaluated. The patient was initially scheduled for a left partial knee arthroplasty with Dr. Joice Lofts in July however the patient did undergo a repeat stress test which did not reveal any new acute findings. He does feel that the pain in his left knee has improved since his last evaluation but he would still like to proceed with surgery at this time. The patient reports a 3 out of 10 pain score. He denies any history of heart attack or stroke. He denies any history of asthma or COPD. He is a diabetic, he is currently on insulin and is taking once weekly Ozempic. The patient does have a history of a DVT and does take Xarelto daily as well. He denies any trauma or injury affecting left knee since last evaluation. Prior to his last surgical date we did discuss coming off of his Xarelto several days prior to surgery and bridging with a Lovenox injection. We also discussed stopping Ozempic at least 1 week prior to the surgical date.  Past Medical History: Allergic state 1963  Penicillin  Arthritis  Cataract cortical, senile 2016  Successful surgery on both eyes  Depression  DVT (deep venous thrombosis) (CMS/HHS-HCC) 08/2017  GERD (gastroesophageal reflux disease) 2014  Hemorrhoids  History of chicken pox  History of DVT of lower extremity  Hyperlipidemia 2009  Hypertension  Obesity 2015  Sarcoidosis  Sleep apnea 1971  Type 2 diabetes mellitus (CMS/HHS-HCC)   Past Surgical History: ACHILLES TENDON REPAIR 2014  Limited arthroscopic debridement,arthroscopic subacromial decompression, and mini-open repairs of supraspinatus and infraspinatus tears,right shoulder Right 08/02/2015 (Dr.  Joice Lofts)  Debridement/repair of common extensor origin, right elbow. Right 10/02/2016 (Dr. Joice Lofts)  Arthroscopic partial medial meniscectomy with abrasion chondroplasty of grade ii-iii chondromalacia of medial tibial plateau, right knee Right 12/09/2018 (Dr. Joice Lofts) COLONOSCOPY 11/23/2019 (Diverticulosis/Otherwsie normal/FHx CP/Repeat 53yrs/TKT)  CATARACT EXTRACTION April of 2018 (Both eyes)  TONSILLECTOMY   Past Family History: High blood pressure (Hypertension) Mother  Colon polyps Mother  Osteoarthritis Mother  Skin cancer Mother  Diabetes type II Maternal Grandmother  Lung cancer Father   Medications: ALPRAZolam (XANAX) 0.5 MG tablet Take 1 tablet (0.5 mg total) by mouth once daily as needed (take RARELY for anxiety) 30 tablet 0  amLODIPine (NORVASC) 5 MG tablet Take 1 tablet (5 mg total) by mouth once daily 90 tablet 1  atorvastatin (LIPITOR) 10 MG tablet Take 1 tablet (10 mg total) by mouth once daily 90 tablet 1  azaTHIOprine (IMURAN) 50 mg tablet TAKE 2 TABLETS ONE TIME DAILY 180 tablet 3  blood glucose diagnostic test strip 1 each (1 strip total) 4 (four) times daily Use as instructed. 400 each 2  cetirizine (ZYRTEC) 10 MG tablet Take 10 mg by mouth once daily as needed  gabapentin (NEURONTIN) 300 MG capsule Take 300 mg in the morning and 300 mg in the afternoon and 900 mg at night 450 capsule 1  insulin ASPART (NOVOLOG FLEXPEN) pen injector (concentration 100 units/mL) Sliding scale three times daily before meals, max 40 units daily 45 mL 1  insulin GLARGINE (LANTUS SOLOSTAR) pen injector (concentration 100 units/mL) Inject 46 Units subcutaneously once daily 45 mL 3  ketoconazole (NIZORAL) 2 % shampoo Apply  topically every Monday, Wednesday, and Friday  lisinopriL-hydroCHLOROthiazide (ZESTORETIC) 20-25 mg tablet Take 1 tablet by mouth once daily 90 tablet 1  metFORMIN (GLUCOPHAGE-XR) 500 MG XR tablet Take 2 tablets (1,000 mg total) by mouth 2 (two) times daily with meals 360 tablet 3   omeprazole (PRILOSEC) 40 MG DR capsule Take 1 capsule (40 mg total) by mouth once daily 90 capsule 1  pen needle, diabetic (BD ULTRA-FINE MINI PEN NEEDLE) 31 gauge x 3/16" needle Use as directed for up to 90 days 300 each 3  pramipexole (MIRAPEX) 0.5 MG tablet TAKE 1 TABLET THREE TIMES DAILY 270 tablet 3  rivaroxaban (XARELTO) 20 mg tablet Take 1 tablet (20 mg total) by mouth daily with breakfast 90 tablet 3  semaglutide (OZEMPIC) 1 mg/dose (4 mg/3 mL) pen injector Inject 0.75 mLs (1 mg total) subcutaneously once a week 9 mL 3  sildenafil (REVATIO) 20 mg tablet Take 2-4 tablets (40-80 mg total) by mouth once daily as needed 30 tablet 5  traMADoL (ULTRAM) 50 mg tablet Take 1 tablet (50 mg total) by mouth every 6 (six) hours as needed for Pain 30 tablet 0  traZODone (DESYREL) 50 MG tablet Take 1 tablet (50 mg total) by mouth at bedtime 90 tablet 3   Allergies: Penicillin Rash   Review of Systems:  A comprehensive 14 point ROS was performed, reviewed by me today, and the pertinent orthopaedic findings are documented in the HPI.  Physical Exam: BP 130/78  Ht 181.6 cm (5' 11.5")  Wt (!) 116.6 kg (257 lb)  BMI 35.34 kg/m  General/Constitutional: The patient appears to be well-nourished, well-developed, and in no acute distress. Neuro/Psych: Normal mood and affect, oriented to person, place and time. Eyes: Non-icteric. Pupils are equal, round, and reactive to light, and exhibit synchronous movement. ENT: Unremarkable. Lymphatic: No palpable adenopathy. Respiratory: Lungs clear to auscultation, Normal chest excursion, No wheezes, and Non-labored breathing Cardiovascular: Regular rate and rhythm. No murmurs. and No edema, swelling or tenderness, except as noted in detailed exam. Integumentary: No impressive skin lesions present, except as noted in detailed exam. Musculoskeletal: Unremarkable, except as noted in detailed exam.  Left knee exam: GAIT: moderate limp and uses no assistive  devices. ALIGNMENT: mild varus SKIN: unremarkable SWELLING: minimal EFFUSION: trace WARMTH: no warmth TENDERNESS: Mild-moderate over the medial joint line ROM: full without pain McMURRAY'S: positive PATELLOFEMORAL: normal tracking with no peri-patellar tenderness and negative apprehension sign CREPITUS: no LACHMAN'S: negative PIVOT SHIFT: negative ANTERIOR DRAWER: negative POSTERIOR DRAWER: negative VARUS/VALGUS: stable  He is grossly neurovascularly intact to the left lower extremity and foot, but he exhibits evidence of early venous insufficiency.  Knee Imaging: Recent AP weightbearing of both knees, as well as lateral and merchant views of the left knee are available for review and have been reviewed by myself. These films demonstrate moderate degenerative changes, primarily involving the medial compartment with 30% medial joint space narrowing. Overall alignment is mild varus. No fractures, lytic lesions, or abnormal calcifications are noted.  Knee Imaging, external: Left knee: A recent MRI scan of the left knee is available for review and has been reviewed by myself. By report, this can demonstrates evidence of complex degenerative tearing of the posterior and middle portions of the medial meniscus with medial meniscal extrusion. These findings are worsened as compared to his prior MRI scan from 3 years ago. In addition, "mild thinning" of the medial femoral and tibial articular surfaces are identified. Finally, there is evidence of bone marrow edema involving the medial  femoral condyle and medial tibial plateau with an insufficiency fracture line noted on the weightbearing portion of the medial femoral condyle.  Impression: 1. Primary osteoarthritis of left knee. 2. Complex tear of medial meniscus of left knee.  Plan:  1. Treatment options were discussed today with the patient. 2. The patient is scheduled for a left partial knee arthroplasty with Dr. Joice Lofts on 12/09/22. 3. The  patient was instructed on the risk and benefits of surgical intervention and wishes to proceed at this time. 4. The patient was instructed to stop his Ozempic after his dose today, 12/01/22. The patient was instructed to stop his Xarelto on 12/05/22. 5. Due to his history of having 2 unprovoked DVTs, the plan will be to bridge with Lovenox 1 mg/kilogram twice daily until day before surgery, 12/08/2022. The patient already has this medication. 6. This document will serve as a surgical history and physical for the patient. 7. The patient will follow-up per standard postop protocol at this time. They can call the clinic they have any questions, new symptoms develop or symptoms worsen.  The procedure was discussed with the patient, as were the potential risks (including bleeding, infection, nerve and/or blood vessel injury, persistent or recurrent pain, failure of the hardware, progression of arthritis, need for further surgery, blood clots, strokes, heart attacks and/or arhythmias, pneumonia, etc.) and benefits. The patient states his understanding and wishes to proceed.    H&P reviewed and patient re-examined. No changes.

## 2022-12-10 DIAGNOSIS — I2510 Atherosclerotic heart disease of native coronary artery without angina pectoris: Secondary | ICD-10-CM | POA: Diagnosis not present

## 2022-12-10 DIAGNOSIS — I10 Essential (primary) hypertension: Secondary | ICD-10-CM | POA: Diagnosis not present

## 2022-12-10 DIAGNOSIS — F419 Anxiety disorder, unspecified: Secondary | ICD-10-CM | POA: Diagnosis not present

## 2022-12-10 DIAGNOSIS — Z471 Aftercare following joint replacement surgery: Secondary | ICD-10-CM | POA: Diagnosis not present

## 2022-12-10 DIAGNOSIS — E1140 Type 2 diabetes mellitus with diabetic neuropathy, unspecified: Secondary | ICD-10-CM | POA: Diagnosis not present

## 2022-12-10 DIAGNOSIS — E785 Hyperlipidemia, unspecified: Secondary | ICD-10-CM | POA: Diagnosis not present

## 2022-12-10 DIAGNOSIS — F32A Depression, unspecified: Secondary | ICD-10-CM | POA: Diagnosis not present

## 2022-12-10 DIAGNOSIS — K219 Gastro-esophageal reflux disease without esophagitis: Secondary | ICD-10-CM | POA: Diagnosis not present

## 2022-12-10 DIAGNOSIS — Z794 Long term (current) use of insulin: Secondary | ICD-10-CM | POA: Diagnosis not present

## 2022-12-11 DIAGNOSIS — I10 Essential (primary) hypertension: Secondary | ICD-10-CM | POA: Diagnosis not present

## 2022-12-11 NOTE — Anesthesia Postprocedure Evaluation (Signed)
Anesthesia Post Note  Patient: RINO TWILLEY  Procedure(s) Performed: LEFT PARTIAL KNEE REPLACEMENT. (Left: Knee)  Patient location during evaluation: Nursing Unit Anesthesia Type: Spinal Level of consciousness: awake and alert Pain management: pain level controlled Vital Signs Assessment: post-procedure vital signs reviewed and stable Respiratory status: spontaneous breathing, nonlabored ventilation, respiratory function stable and patient connected to nasal cannula oxygen Cardiovascular status: blood pressure returned to baseline and stable Postop Assessment: no apparent nausea or vomiting Anesthetic complications: no  No notable events documented.   Last Vitals:  Vitals:   12/09/22 1339 12/09/22 1442  BP: (!) 127/55   Pulse: 68   Resp: 18 18  Temp: (!) 36.2 C   SpO2: 92%     Last Pain:  Vitals:   12/10/22 0929  TempSrc:   PainSc: 2                  Stephanie Coup

## 2022-12-12 DIAGNOSIS — F32A Depression, unspecified: Secondary | ICD-10-CM | POA: Diagnosis not present

## 2022-12-12 DIAGNOSIS — E1140 Type 2 diabetes mellitus with diabetic neuropathy, unspecified: Secondary | ICD-10-CM | POA: Diagnosis not present

## 2022-12-12 DIAGNOSIS — Z794 Long term (current) use of insulin: Secondary | ICD-10-CM | POA: Diagnosis not present

## 2022-12-12 DIAGNOSIS — K219 Gastro-esophageal reflux disease without esophagitis: Secondary | ICD-10-CM | POA: Diagnosis not present

## 2022-12-12 DIAGNOSIS — I10 Essential (primary) hypertension: Secondary | ICD-10-CM | POA: Diagnosis not present

## 2022-12-12 DIAGNOSIS — E785 Hyperlipidemia, unspecified: Secondary | ICD-10-CM | POA: Diagnosis not present

## 2022-12-12 DIAGNOSIS — I2510 Atherosclerotic heart disease of native coronary artery without angina pectoris: Secondary | ICD-10-CM | POA: Diagnosis not present

## 2022-12-12 DIAGNOSIS — F419 Anxiety disorder, unspecified: Secondary | ICD-10-CM | POA: Diagnosis not present

## 2022-12-12 DIAGNOSIS — Z471 Aftercare following joint replacement surgery: Secondary | ICD-10-CM | POA: Diagnosis not present

## 2022-12-14 DIAGNOSIS — E785 Hyperlipidemia, unspecified: Secondary | ICD-10-CM | POA: Diagnosis not present

## 2022-12-14 DIAGNOSIS — I10 Essential (primary) hypertension: Secondary | ICD-10-CM | POA: Diagnosis not present

## 2022-12-14 DIAGNOSIS — K219 Gastro-esophageal reflux disease without esophagitis: Secondary | ICD-10-CM | POA: Diagnosis not present

## 2022-12-14 DIAGNOSIS — F419 Anxiety disorder, unspecified: Secondary | ICD-10-CM | POA: Diagnosis not present

## 2022-12-14 DIAGNOSIS — F32A Depression, unspecified: Secondary | ICD-10-CM | POA: Diagnosis not present

## 2022-12-14 DIAGNOSIS — Z794 Long term (current) use of insulin: Secondary | ICD-10-CM | POA: Diagnosis not present

## 2022-12-14 DIAGNOSIS — Z471 Aftercare following joint replacement surgery: Secondary | ICD-10-CM | POA: Diagnosis not present

## 2022-12-14 DIAGNOSIS — E1140 Type 2 diabetes mellitus with diabetic neuropathy, unspecified: Secondary | ICD-10-CM | POA: Diagnosis not present

## 2022-12-14 DIAGNOSIS — I2510 Atherosclerotic heart disease of native coronary artery without angina pectoris: Secondary | ICD-10-CM | POA: Diagnosis not present

## 2022-12-16 DIAGNOSIS — E785 Hyperlipidemia, unspecified: Secondary | ICD-10-CM | POA: Diagnosis not present

## 2022-12-16 DIAGNOSIS — Z794 Long term (current) use of insulin: Secondary | ICD-10-CM | POA: Diagnosis not present

## 2022-12-16 DIAGNOSIS — F32A Depression, unspecified: Secondary | ICD-10-CM | POA: Diagnosis not present

## 2022-12-16 DIAGNOSIS — I10 Essential (primary) hypertension: Secondary | ICD-10-CM | POA: Diagnosis not present

## 2022-12-16 DIAGNOSIS — Z471 Aftercare following joint replacement surgery: Secondary | ICD-10-CM | POA: Diagnosis not present

## 2022-12-16 DIAGNOSIS — F419 Anxiety disorder, unspecified: Secondary | ICD-10-CM | POA: Diagnosis not present

## 2022-12-16 DIAGNOSIS — E1140 Type 2 diabetes mellitus with diabetic neuropathy, unspecified: Secondary | ICD-10-CM | POA: Diagnosis not present

## 2022-12-16 DIAGNOSIS — K219 Gastro-esophageal reflux disease without esophagitis: Secondary | ICD-10-CM | POA: Diagnosis not present

## 2022-12-16 DIAGNOSIS — I2510 Atherosclerotic heart disease of native coronary artery without angina pectoris: Secondary | ICD-10-CM | POA: Diagnosis not present

## 2022-12-19 DIAGNOSIS — I10 Essential (primary) hypertension: Secondary | ICD-10-CM | POA: Diagnosis not present

## 2022-12-19 DIAGNOSIS — F32A Depression, unspecified: Secondary | ICD-10-CM | POA: Diagnosis not present

## 2022-12-19 DIAGNOSIS — F419 Anxiety disorder, unspecified: Secondary | ICD-10-CM | POA: Diagnosis not present

## 2022-12-19 DIAGNOSIS — E785 Hyperlipidemia, unspecified: Secondary | ICD-10-CM | POA: Diagnosis not present

## 2022-12-19 DIAGNOSIS — I2510 Atherosclerotic heart disease of native coronary artery without angina pectoris: Secondary | ICD-10-CM | POA: Diagnosis not present

## 2022-12-19 DIAGNOSIS — Z471 Aftercare following joint replacement surgery: Secondary | ICD-10-CM | POA: Diagnosis not present

## 2022-12-19 DIAGNOSIS — Z794 Long term (current) use of insulin: Secondary | ICD-10-CM | POA: Diagnosis not present

## 2022-12-19 DIAGNOSIS — K219 Gastro-esophageal reflux disease without esophagitis: Secondary | ICD-10-CM | POA: Diagnosis not present

## 2022-12-19 DIAGNOSIS — E1140 Type 2 diabetes mellitus with diabetic neuropathy, unspecified: Secondary | ICD-10-CM | POA: Diagnosis not present

## 2022-12-23 DIAGNOSIS — E785 Hyperlipidemia, unspecified: Secondary | ICD-10-CM | POA: Diagnosis not present

## 2022-12-23 DIAGNOSIS — F419 Anxiety disorder, unspecified: Secondary | ICD-10-CM | POA: Diagnosis not present

## 2022-12-23 DIAGNOSIS — Z471 Aftercare following joint replacement surgery: Secondary | ICD-10-CM | POA: Diagnosis not present

## 2022-12-23 DIAGNOSIS — K219 Gastro-esophageal reflux disease without esophagitis: Secondary | ICD-10-CM | POA: Diagnosis not present

## 2022-12-23 DIAGNOSIS — Z794 Long term (current) use of insulin: Secondary | ICD-10-CM | POA: Diagnosis not present

## 2022-12-23 DIAGNOSIS — F32A Depression, unspecified: Secondary | ICD-10-CM | POA: Diagnosis not present

## 2022-12-23 DIAGNOSIS — I10 Essential (primary) hypertension: Secondary | ICD-10-CM | POA: Diagnosis not present

## 2022-12-23 DIAGNOSIS — I2510 Atherosclerotic heart disease of native coronary artery without angina pectoris: Secondary | ICD-10-CM | POA: Diagnosis not present

## 2022-12-23 DIAGNOSIS — E1140 Type 2 diabetes mellitus with diabetic neuropathy, unspecified: Secondary | ICD-10-CM | POA: Diagnosis not present

## 2022-12-24 DIAGNOSIS — Z96652 Presence of left artificial knee joint: Secondary | ICD-10-CM | POA: Diagnosis not present

## 2022-12-24 DIAGNOSIS — M25562 Pain in left knee: Secondary | ICD-10-CM | POA: Diagnosis not present

## 2022-12-29 ENCOUNTER — Encounter: Payer: Self-pay | Admitting: Surgery

## 2022-12-31 DIAGNOSIS — Z96652 Presence of left artificial knee joint: Secondary | ICD-10-CM | POA: Diagnosis not present

## 2023-01-01 DIAGNOSIS — I700 Atherosclerosis of aorta: Secondary | ICD-10-CM | POA: Diagnosis not present

## 2023-01-01 DIAGNOSIS — E119 Type 2 diabetes mellitus without complications: Secondary | ICD-10-CM | POA: Diagnosis not present

## 2023-01-01 DIAGNOSIS — D869 Sarcoidosis, unspecified: Secondary | ICD-10-CM | POA: Diagnosis not present

## 2023-01-01 DIAGNOSIS — Z23 Encounter for immunization: Secondary | ICD-10-CM | POA: Diagnosis not present

## 2023-01-01 DIAGNOSIS — I825Y9 Chronic embolism and thrombosis of unspecified deep veins of unspecified proximal lower extremity: Secondary | ICD-10-CM | POA: Diagnosis not present

## 2023-01-01 DIAGNOSIS — I10 Essential (primary) hypertension: Secondary | ICD-10-CM | POA: Diagnosis not present

## 2023-01-01 DIAGNOSIS — F325 Major depressive disorder, single episode, in full remission: Secondary | ICD-10-CM | POA: Diagnosis not present

## 2023-01-01 DIAGNOSIS — E7800 Pure hypercholesterolemia, unspecified: Secondary | ICD-10-CM | POA: Diagnosis not present

## 2023-01-02 DIAGNOSIS — M25562 Pain in left knee: Secondary | ICD-10-CM | POA: Diagnosis not present

## 2023-01-02 DIAGNOSIS — Z96652 Presence of left artificial knee joint: Secondary | ICD-10-CM | POA: Diagnosis not present

## 2023-01-05 DIAGNOSIS — Z96652 Presence of left artificial knee joint: Secondary | ICD-10-CM | POA: Diagnosis not present

## 2023-01-05 DIAGNOSIS — M25562 Pain in left knee: Secondary | ICD-10-CM | POA: Diagnosis not present

## 2023-01-06 DIAGNOSIS — Z794 Long term (current) use of insulin: Secondary | ICD-10-CM | POA: Diagnosis not present

## 2023-01-06 DIAGNOSIS — E119 Type 2 diabetes mellitus without complications: Secondary | ICD-10-CM | POA: Diagnosis not present

## 2023-01-06 DIAGNOSIS — E782 Mixed hyperlipidemia: Secondary | ICD-10-CM | POA: Diagnosis not present

## 2023-01-06 DIAGNOSIS — I10 Essential (primary) hypertension: Secondary | ICD-10-CM | POA: Diagnosis not present

## 2023-01-12 DIAGNOSIS — Z96652 Presence of left artificial knee joint: Secondary | ICD-10-CM | POA: Diagnosis not present

## 2023-01-12 DIAGNOSIS — M25562 Pain in left knee: Secondary | ICD-10-CM | POA: Diagnosis not present

## 2023-01-15 DIAGNOSIS — M25562 Pain in left knee: Secondary | ICD-10-CM | POA: Diagnosis not present

## 2023-01-15 DIAGNOSIS — Z96652 Presence of left artificial knee joint: Secondary | ICD-10-CM | POA: Diagnosis not present

## 2023-01-20 DIAGNOSIS — M25562 Pain in left knee: Secondary | ICD-10-CM | POA: Diagnosis not present

## 2023-01-20 DIAGNOSIS — Z96652 Presence of left artificial knee joint: Secondary | ICD-10-CM | POA: Diagnosis not present

## 2023-01-22 DIAGNOSIS — M25562 Pain in left knee: Secondary | ICD-10-CM | POA: Diagnosis not present

## 2023-01-23 DIAGNOSIS — M1712 Unilateral primary osteoarthritis, left knee: Secondary | ICD-10-CM | POA: Diagnosis not present

## 2023-01-23 DIAGNOSIS — Z96652 Presence of left artificial knee joint: Secondary | ICD-10-CM | POA: Diagnosis not present

## 2023-03-09 DIAGNOSIS — Z96652 Presence of left artificial knee joint: Secondary | ICD-10-CM | POA: Diagnosis not present

## 2023-03-19 DIAGNOSIS — E118 Type 2 diabetes mellitus with unspecified complications: Secondary | ICD-10-CM | POA: Diagnosis not present

## 2023-03-19 DIAGNOSIS — B351 Tinea unguium: Secondary | ICD-10-CM | POA: Diagnosis not present

## 2023-03-19 DIAGNOSIS — M79674 Pain in right toe(s): Secondary | ICD-10-CM | POA: Diagnosis not present

## 2023-03-19 DIAGNOSIS — S90211A Contusion of right great toe with damage to nail, initial encounter: Secondary | ICD-10-CM | POA: Diagnosis not present

## 2023-04-20 ENCOUNTER — Other Ambulatory Visit
Admission: RE | Admit: 2023-04-20 | Discharge: 2023-04-20 | Disposition: A | Discharge status: Home / Self Care | Payer: Medicare HMO | Source: Ambulatory Visit | Attending: Student | Admitting: Student

## 2023-04-20 DIAGNOSIS — G8929 Other chronic pain: Secondary | ICD-10-CM | POA: Diagnosis not present

## 2023-04-20 DIAGNOSIS — Z471 Aftercare following joint replacement surgery: Secondary | ICD-10-CM | POA: Diagnosis not present

## 2023-04-20 DIAGNOSIS — E1169 Type 2 diabetes mellitus with other specified complication: Secondary | ICD-10-CM | POA: Diagnosis not present

## 2023-04-20 DIAGNOSIS — M25462 Effusion, left knee: Secondary | ICD-10-CM | POA: Diagnosis not present

## 2023-04-20 DIAGNOSIS — E669 Obesity, unspecified: Secondary | ICD-10-CM | POA: Diagnosis not present

## 2023-04-20 DIAGNOSIS — Z96652 Presence of left artificial knee joint: Secondary | ICD-10-CM | POA: Diagnosis not present

## 2023-04-20 DIAGNOSIS — M1712 Unilateral primary osteoarthritis, left knee: Secondary | ICD-10-CM | POA: Diagnosis not present

## 2023-04-20 LAB — SYNOVIAL CELL COUNT + DIFF, W/ CRYSTALS
Crystals, Fluid: NONE SEEN
Eosinophils-Synovial: 0 %
Lymphocytes-Synovial Fld: 53 %
Monocyte-Macrophage-Synovial Fluid: 45 %
Neutrophil, Synovial: 1 %
WBC, Synovial: 66 /mm3 (ref 0–200)

## 2023-04-21 DIAGNOSIS — E669 Obesity, unspecified: Secondary | ICD-10-CM | POA: Diagnosis not present

## 2023-04-21 DIAGNOSIS — M1712 Unilateral primary osteoarthritis, left knee: Secondary | ICD-10-CM | POA: Diagnosis not present

## 2023-04-21 DIAGNOSIS — G8929 Other chronic pain: Secondary | ICD-10-CM | POA: Diagnosis not present

## 2023-04-21 DIAGNOSIS — M25462 Effusion, left knee: Secondary | ICD-10-CM | POA: Diagnosis not present

## 2023-04-21 DIAGNOSIS — Z96652 Presence of left artificial knee joint: Secondary | ICD-10-CM | POA: Diagnosis not present

## 2023-04-21 DIAGNOSIS — E1169 Type 2 diabetes mellitus with other specified complication: Secondary | ICD-10-CM | POA: Diagnosis not present

## 2023-04-28 ENCOUNTER — Other Ambulatory Visit
Admission: RE | Admit: 2023-04-28 | Discharge: 2023-04-28 | Disposition: A | Discharge status: Home / Self Care | Payer: Medicare HMO | Source: Ambulatory Visit | Attending: Student | Admitting: Student

## 2023-04-28 DIAGNOSIS — Z96652 Presence of left artificial knee joint: Secondary | ICD-10-CM | POA: Insufficient documentation

## 2023-04-28 DIAGNOSIS — M25462 Effusion, left knee: Secondary | ICD-10-CM | POA: Diagnosis not present

## 2023-04-28 DIAGNOSIS — M1712 Unilateral primary osteoarthritis, left knee: Secondary | ICD-10-CM | POA: Diagnosis not present

## 2023-04-28 DIAGNOSIS — G8929 Other chronic pain: Secondary | ICD-10-CM | POA: Diagnosis not present

## 2023-04-28 DIAGNOSIS — E1169 Type 2 diabetes mellitus with other specified complication: Secondary | ICD-10-CM | POA: Diagnosis not present

## 2023-04-28 DIAGNOSIS — E669 Obesity, unspecified: Secondary | ICD-10-CM | POA: Diagnosis not present

## 2023-04-28 DIAGNOSIS — M25562 Pain in left knee: Secondary | ICD-10-CM | POA: Diagnosis not present

## 2023-04-28 LAB — SYNOVIAL CELL COUNT + DIFF, W/ CRYSTALS
Crystals, Fluid: NONE SEEN
Eosinophils-Synovial: 3 %
Lymphocytes-Synovial Fld: 32 %
Monocyte-Macrophage-Synovial Fluid: 5 %
Neutrophil, Synovial: 60 %
WBC, Synovial: 517 /mm3 — ABNORMAL HIGH (ref 0–200)

## 2023-04-30 ENCOUNTER — Ambulatory Visit: Payer: Medicare HMO | Admitting: Dermatology

## 2023-05-04 DIAGNOSIS — Z794 Long term (current) use of insulin: Secondary | ICD-10-CM | POA: Diagnosis not present

## 2023-05-04 DIAGNOSIS — E119 Type 2 diabetes mellitus without complications: Secondary | ICD-10-CM | POA: Diagnosis not present

## 2023-05-05 DIAGNOSIS — E119 Type 2 diabetes mellitus without complications: Secondary | ICD-10-CM | POA: Diagnosis not present

## 2023-05-05 DIAGNOSIS — Z794 Long term (current) use of insulin: Secondary | ICD-10-CM | POA: Diagnosis not present

## 2023-05-07 DIAGNOSIS — Z96652 Presence of left artificial knee joint: Secondary | ICD-10-CM | POA: Diagnosis not present

## 2023-05-07 DIAGNOSIS — M7051 Other bursitis of knee, right knee: Secondary | ICD-10-CM | POA: Diagnosis not present

## 2023-05-07 DIAGNOSIS — M1711 Unilateral primary osteoarthritis, right knee: Secondary | ICD-10-CM | POA: Diagnosis not present

## 2023-05-11 ENCOUNTER — Ambulatory Visit: Payer: Medicare HMO | Admitting: Dermatology

## 2023-05-11 DIAGNOSIS — E782 Mixed hyperlipidemia: Secondary | ICD-10-CM | POA: Diagnosis not present

## 2023-05-11 DIAGNOSIS — I10 Essential (primary) hypertension: Secondary | ICD-10-CM | POA: Diagnosis not present

## 2023-05-11 DIAGNOSIS — Z794 Long term (current) use of insulin: Secondary | ICD-10-CM | POA: Diagnosis not present

## 2023-05-11 DIAGNOSIS — E1165 Type 2 diabetes mellitus with hyperglycemia: Secondary | ICD-10-CM | POA: Diagnosis not present

## 2023-05-19 DIAGNOSIS — B351 Tinea unguium: Secondary | ICD-10-CM | POA: Diagnosis not present

## 2023-05-19 DIAGNOSIS — S90211A Contusion of right great toe with damage to nail, initial encounter: Secondary | ICD-10-CM | POA: Diagnosis not present

## 2023-05-19 DIAGNOSIS — E118 Type 2 diabetes mellitus with unspecified complications: Secondary | ICD-10-CM | POA: Diagnosis not present

## 2023-06-15 DIAGNOSIS — R053 Chronic cough: Secondary | ICD-10-CM | POA: Diagnosis not present

## 2023-06-15 DIAGNOSIS — E119 Type 2 diabetes mellitus without complications: Secondary | ICD-10-CM | POA: Diagnosis not present

## 2023-06-25 DIAGNOSIS — Z0000 Encounter for general adult medical examination without abnormal findings: Secondary | ICD-10-CM | POA: Diagnosis not present

## 2023-06-25 DIAGNOSIS — I825Y9 Chronic embolism and thrombosis of unspecified deep veins of unspecified proximal lower extremity: Secondary | ICD-10-CM | POA: Diagnosis not present

## 2023-06-25 DIAGNOSIS — F325 Major depressive disorder, single episode, in full remission: Secondary | ICD-10-CM | POA: Diagnosis not present

## 2023-06-25 DIAGNOSIS — I700 Atherosclerosis of aorta: Secondary | ICD-10-CM | POA: Diagnosis not present

## 2023-06-25 DIAGNOSIS — Z1331 Encounter for screening for depression: Secondary | ICD-10-CM | POA: Diagnosis not present

## 2023-06-25 DIAGNOSIS — I10 Essential (primary) hypertension: Secondary | ICD-10-CM | POA: Diagnosis not present

## 2023-06-25 DIAGNOSIS — E119 Type 2 diabetes mellitus without complications: Secondary | ICD-10-CM | POA: Diagnosis not present

## 2023-06-25 DIAGNOSIS — D869 Sarcoidosis, unspecified: Secondary | ICD-10-CM | POA: Diagnosis not present

## 2023-07-01 DIAGNOSIS — G2581 Restless legs syndrome: Secondary | ICD-10-CM | POA: Diagnosis not present

## 2023-07-01 DIAGNOSIS — M545 Low back pain, unspecified: Secondary | ICD-10-CM | POA: Diagnosis not present

## 2023-07-01 DIAGNOSIS — G8929 Other chronic pain: Secondary | ICD-10-CM | POA: Diagnosis not present

## 2023-07-02 ENCOUNTER — Ambulatory Visit: Payer: Medicare HMO | Admitting: Dermatology

## 2023-07-06 ENCOUNTER — Ambulatory Visit: Admitting: Dermatology

## 2023-07-06 ENCOUNTER — Encounter: Payer: Self-pay | Admitting: Dermatology

## 2023-07-06 DIAGNOSIS — Z1283 Encounter for screening for malignant neoplasm of skin: Secondary | ICD-10-CM | POA: Diagnosis not present

## 2023-07-06 DIAGNOSIS — Z79899 Other long term (current) drug therapy: Secondary | ICD-10-CM

## 2023-07-06 DIAGNOSIS — L570 Actinic keratosis: Secondary | ICD-10-CM

## 2023-07-06 DIAGNOSIS — L820 Inflamed seborrheic keratosis: Secondary | ICD-10-CM

## 2023-07-06 DIAGNOSIS — L821 Other seborrheic keratosis: Secondary | ICD-10-CM | POA: Diagnosis not present

## 2023-07-06 DIAGNOSIS — L219 Seborrheic dermatitis, unspecified: Secondary | ICD-10-CM | POA: Diagnosis not present

## 2023-07-06 DIAGNOSIS — D229 Melanocytic nevi, unspecified: Secondary | ICD-10-CM

## 2023-07-06 DIAGNOSIS — W908XXA Exposure to other nonionizing radiation, initial encounter: Secondary | ICD-10-CM | POA: Diagnosis not present

## 2023-07-06 DIAGNOSIS — L720 Epidermal cyst: Secondary | ICD-10-CM

## 2023-07-06 DIAGNOSIS — Z7189 Other specified counseling: Secondary | ICD-10-CM

## 2023-07-06 DIAGNOSIS — D1801 Hemangioma of skin and subcutaneous tissue: Secondary | ICD-10-CM

## 2023-07-06 DIAGNOSIS — L814 Other melanin hyperpigmentation: Secondary | ICD-10-CM | POA: Diagnosis not present

## 2023-07-06 DIAGNOSIS — L719 Rosacea, unspecified: Secondary | ICD-10-CM

## 2023-07-06 DIAGNOSIS — L578 Other skin changes due to chronic exposure to nonionizing radiation: Secondary | ICD-10-CM | POA: Diagnosis not present

## 2023-07-06 DIAGNOSIS — L729 Follicular cyst of the skin and subcutaneous tissue, unspecified: Secondary | ICD-10-CM

## 2023-07-06 DIAGNOSIS — L817 Pigmented purpuric dermatosis: Secondary | ICD-10-CM

## 2023-07-06 DIAGNOSIS — I872 Venous insufficiency (chronic) (peripheral): Secondary | ICD-10-CM

## 2023-07-06 MED ORDER — KETOCONAZOLE 2 % EX SHAM: MEDICATED_SHAMPOO | CUTANEOUS | 4 refills | Status: AC

## 2023-07-06 NOTE — Progress Notes (Signed)
 Follow-Up Visit   Subjective  George Daniels is a 78 y.o. male who presents for the following: Skin Cancer Screening and Full Body Skin Exam  The patient presents for Total-Body Skin Exam (TBSE) for skin cancer screening and mole check. The patient has spots, moles and lesions to be evaluated, some may be new or changing and the patient may have concern these could be cancer.  The following portions of the chart were reviewed this encounter and updated as appropriate: medications, allergies, medical history  Review of Systems:  No other skin or systemic complaints except as noted in HPI or Assessment and Plan.  Objective  Well appearing patient in no apparent distress; mood and affect are within normal limits.  A full examination was performed including scalp, head, eyes, ears, nose, lips, neck, chest, axillae, abdomen, back, buttocks, bilateral upper extremities, bilateral lower extremities, hands, feet, fingers, toes, fingernails, and toenails. All findings within normal limits unless otherwise noted below.   Relevant physical exam findings are noted in the Assessment and Plan.  L forehead Open comedone. Scalp x 1, L hand x 1 (2) Erythematous stuck-on, waxy papule or plaque Face and ears x 17 (17) Erythematous thin papules/macules with gritty scale.   Assessment & Plan   SKIN CANCER SCREENING PERFORMED TODAY.  ACTINIC DAMAGE - Chronic condition, secondary to cumulative UV/sun exposure - diffuse scaly erythematous macules with underlying dyspigmentation - Recommend daily broad spectrum sunscreen SPF 30+ to sun-exposed areas, reapply every 2 hours as needed.  - Staying in the shade or wearing long sleeves, sun glasses (UVA+UVB protection) and wide brim hats (4-inch brim around the entire circumference of the hat) are also recommended for sun protection.  - Call for new or changing lesions.  LENTIGINES, SEBORRHEIC KERATOSES, HEMANGIOMAS - Benign normal skin lesions -  Benign-appearing - Call for any changes  MELANOCYTIC NEVI - Tan-brown and/or pink-flesh-colored symmetric macules and papules - Benign appearing on exam today - Observation - Call clinic for new or changing moles - Recommend daily use of broad spectrum spf 30+ sunscreen to sun-exposed areas.   ROSACEA Exam Mid face erythema with telangiectasias +/- scattered inflammatory papules of face. Chronic and persistent condition with duration or expected duration over one year. Condition is symptomatic / bothersome to patient. Not to goal.  Rosacea is a chronic progressive skin condition usually affecting the face of adults, causing redness and/or acne bumps. It is treatable but not curable. It sometimes affects the eyes (ocular rosacea) as well. It may respond to topical and/or systemic medication and can flare with stress, sun exposure, alcohol, exercise, topical steroids (including hydrocortisone/cortisone 10) and some foods.  Daily application of broad spectrum spf 30+ sunscreen to face is recommended to reduce flares. Treatment Plan Counseling for BBL / IPL / Laser and Coordination of Care Discussed the treatment option of Broad Band Light (BBL) /Intense Pulsed Light (IPL)/ Laser for skin discoloration, including brown spots and redness.  Typically we recommend at least 1-3 treatment sessions about 5-8 weeks apart for best results.  Cannot have tanned skin when BBL performed, and regular use of sunscreen/photoprotection is advised after the procedure to help maintain results. The patient's condition may also require "maintenance treatments" in the future.  The fee for BBL / laser treatments is $350 per treatment session for the whole face.  A fee can be quoted for other parts of the body.  Insurance typically does not pay for BBL/laser treatments and therefore the fee is an out-of-pocket cost.  Recommend prophylactic valtrex treatment. Once scheduled for procedure, will send Rx in prior to patient's  appointment.   Stasis dermatitis of both legs B/L leg With Schamberg's purpura - Stasis in the legs causes chronic leg swelling, which may result in itchy or painful rashes, skin discoloration, skin texture changes, and sometimes ulceration.  Recommend daily graduated compression hose/stockings- easiest to put on first thing in morning, remove at bedtime.  Elevate legs as much as possible. Avoid salt/sodium rich foods.   Epidermal inclusion cyst    R post auricular  Benign-appearing. Exam most consistent with an epidermal inclusion cyst. Discussed that a cyst is a benign growth that can grow over time and sometimes get irritated or inflamed. Recommend observation if it is not bothersome. Discussed option of surgical excision to remove it if it is growing, symptomatic, or other changes noted. Please call for new or changing lesions so they can be evaluated.  MILIUM CYST L forehead Acne/Milia surgery - L forehead Procedure risks and benefits were discussed with the patient and verbal consent was obtained. Following prep of the skin on the L forehead with an alcohol swab, extraction of milia was performed with a comedone extractor following superficial incision made over their surfaces with a #11 surgical blade. Capillary hemostasis was achieved with 20% aluminum chloride solution. Vaseline ointment was applied to each site. The patient tolerated the procedure well. INFLAMED SEBORRHEIC KERATOSIS (2) Scalp x 1, L hand x 1 (2) Symptomatic, irritating, patient would like treated.  Destruction of lesion - Scalp x 1, L hand x 1 (2) Complexity: simple   Destruction method: cryotherapy   Informed consent: discussed and consent obtained   Timeout:  patient name, date of birth, surgical site, and procedure verified Lesion destroyed using liquid nitrogen: Yes   Region frozen until ice ball extended beyond lesion: Yes   Outcome: patient tolerated procedure well with no complications   Post-procedure  details: wound care instructions given   AK (ACTINIC KERATOSIS) (17) Face and ears x 17 (17) Actinic keratoses are precancerous spots that appear secondary to cumulative UV radiation exposure/sun exposure over time. They are chronic with expected duration over 1 year. A portion of actinic keratoses will progress to squamous cell carcinoma of the skin. It is not possible to reliably predict which spots will progress to skin cancer and so treatment is recommended to prevent development of skin cancer.  Recommend daily broad spectrum sunscreen SPF 30+ to sun-exposed areas, reapply every 2 hours as needed.  Recommend staying in the shade or wearing long sleeves, sun glasses (UVA+UVB protection) and wide brim hats (4-inch brim around the entire circumference of the hat). Call for new or changing lesions.  Destruction of lesion - Face and ears x 17 (17) Complexity: simple   Destruction method: cryotherapy   Informed consent: discussed and consent obtained   Timeout:  patient name, date of birth, surgical site, and procedure verified Lesion destroyed using liquid nitrogen: Yes   Region frozen until ice ball extended beyond lesion: Yes   Outcome: patient tolerated procedure well with no complications   Post-procedure details: wound care instructions given   SEBORRHEIC DERMATITIS   Related Medications ketoconazole (NIZORAL) 2 % shampoo Massage into scalp let sit 5-10 minutes then wash out. Use 3d/wk.   Seborrheic dermatitis Scalp Seborrheic Dermatitis  -  is a chronic persistent rash characterized by pinkness and scaling most commonly of the mid face but also can occur on the scalp (dandruff), ears; mid chest, mid back and  groin.  It tends to be exacerbated by stress and cooler weather.  People who have neurologic disease may experience new onset or exacerbation of existing seborrheic dermatitis.  The condition is not curable but treatable and can be controlled.   Continue Ketoconazole 2%  shampoo QD 3d/wk. Let sit 5-10 minutes before washing off.  Return in about 1 year (around 07/05/2024) for TBSE - hx AK, ISK; cyst excision R ear with Dr. Roseanne Reno or Dr. Katrinka Blazing.  Maylene Roes, CMA, am acting as scribe for Armida Sans, MD .  Documentation: I have reviewed the above documentation for accuracy and completeness, and I agree with the above.  Armida Sans, MD

## 2023-07-06 NOTE — Patient Instructions (Addendum)

## 2023-07-13 ENCOUNTER — Ambulatory Visit: Admitting: Dermatology

## 2023-07-13 DIAGNOSIS — D492 Neoplasm of unspecified behavior of bone, soft tissue, and skin: Secondary | ICD-10-CM

## 2023-07-13 DIAGNOSIS — Q859 Phakomatosis, unspecified: Secondary | ICD-10-CM | POA: Diagnosis not present

## 2023-07-13 NOTE — Patient Instructions (Signed)

## 2023-07-13 NOTE — Progress Notes (Signed)
   Follow-Up Visit   Subjective  George Daniels is a 78 y.o. male who presents for the following: Cyst R post auricular, pt presents for excision.  Gets swollen and irritated off and on.   The following portions of the chart were reviewed this encounter and updated as appropriate: medications, allergies, medical history  Review of Systems:  No other skin or systemic complaints except as noted in HPI or Assessment and Plan.  Objective  Well appearing patient in no apparent distress; mood and affect are within normal limits.   A focused examination was performed of the following areas: R post auricular  Relevant exam findings are noted in the Assessment and Plan.  R post medial earlobe White firm papule 0.5cm  Assessment & Plan     NEOPLASM OF SKIN R post medial earlobe Skin excision  Excision method:  punch Lesion length (cm):  0.5 Lesion width (cm):  0.5 Margin per side (cm):  0.1 Total excision diameter (cm):  0.7 Informed consent: discussed and consent obtained   Timeout: patient name, date of birth, surgical site, and procedure verified   Procedure prep:  Patient was prepped and draped in usual sterile fashion Prep type:  Povidone-iodine Anesthesia: the lesion was anesthetized in a standard fashion   Anesthetic:  1% lidocaine w/ epinephrine 1-100,000 buffered w/ 8.4% NaHCO3 (lido w/ epi 2.0cc) Instrument used comment:  4.22mm punch Hemostasis achieved with: pressure   Outcome: patient tolerated procedure well with no complications    Skin repair Complexity:  Simple Final length (cm):  0.8 Informed consent: discussed and consent obtained   Undermining: edges could be approximated without difficulty   Fine/surface layer approximation (top stitches):  Suture size:  4-0 Suture type: nylon   Suture type comment:  Nylon Stitches: simple interrupted   Hemostasis achieved with: suture Outcome: patient tolerated procedure well with no complications   Post-procedure  details: wound care instructions given   Post-procedure details comment:  Ointment and pressure bandage applied Specimen 1 - Surgical pathology Differential Diagnosis: D48.5 Cyst vs other  Check Margins: No White cystic pap 0.5cm  Return in about 1 week (around 07/20/2023) for suture removal.  I, Sonya Hupman, RMA, am acting as scribe for Willeen Niece, MD .   Documentation: I have reviewed the above documentation for accuracy and completeness, and I agree with the above.  Willeen Niece, MD

## 2023-07-14 ENCOUNTER — Telehealth: Payer: Self-pay

## 2023-07-14 NOTE — Telephone Encounter (Signed)
Patient doing fine after yesterdays surgery./sh 

## 2023-07-15 LAB — SURGICAL PATHOLOGY

## 2023-07-20 ENCOUNTER — Ambulatory Visit (INDEPENDENT_AMBULATORY_CARE_PROVIDER_SITE_OTHER): Admitting: Dermatology

## 2023-07-20 DIAGNOSIS — D239 Other benign neoplasm of skin, unspecified: Secondary | ICD-10-CM

## 2023-07-20 DIAGNOSIS — Q859 Phakomatosis, unspecified: Secondary | ICD-10-CM

## 2023-07-20 NOTE — Progress Notes (Signed)
   Follow-Up Visit   Subjective  George Daniels is a 78 y.o. male who presents for the following: 1 wk f/u bx proven FOLLICULOSEBACEOUS CYSTIC HAMARTOMA, pt presents for suture removal  The patient has spots, moles and lesions to be evaluated, some may be new or changing and the patient may have concern these could be cancer.   The following portions of the chart were reviewed this encounter and updated as appropriate: medications, allergies, medical history  Review of Systems:  No other skin or systemic complaints except as noted in HPI or Assessment and Plan.  Objective  Well appearing patient in no apparent distress; mood and affect are within normal limits.   A focused examination was performed of the following areas: Scalp, ear  Relevant exam findings are noted in the Assessment and Plan.    Assessment & Plan   FOLLICULOSEBACEOUS CYSTIC HAMARTOMA  Bx proven benign R post medial earlobe Exam: healing bx site  Treatment Plan:  Encounter for Removal of Sutures - Incision site at the R post medial earlobe is clean, dry and intact - Wound cleansed, sutures removed, wound cleansed.  - Discussed pathology results showing FOLLICULOSEBACEOUS CYSTIC HAMARTOMA   - Patient advised to keep steri-strips dry until they fall off. - Scars remodel for a full year. - Patient can apply over-the-counter silicone scar cream each night to help with scar remodeling if desired. - Patient advised to call with any concerns or if they notice any new or changing lesions.    Return if symptoms worsen or fail to improve.  I, Ardis Rowan, RMA, am acting as scribe for Willeen Niece, MD .   Documentation: I have reviewed the above documentation for accuracy and completeness, and I agree with the above.  Willeen Niece, MD

## 2023-07-20 NOTE — Patient Instructions (Signed)

## 2023-09-02 DIAGNOSIS — E118 Type 2 diabetes mellitus with unspecified complications: Secondary | ICD-10-CM | POA: Diagnosis not present

## 2023-09-02 DIAGNOSIS — E1165 Type 2 diabetes mellitus with hyperglycemia: Secondary | ICD-10-CM | POA: Diagnosis not present

## 2023-09-02 DIAGNOSIS — I825Y9 Chronic embolism and thrombosis of unspecified deep veins of unspecified proximal lower extremity: Secondary | ICD-10-CM | POA: Diagnosis not present

## 2023-09-02 DIAGNOSIS — Z794 Long term (current) use of insulin: Secondary | ICD-10-CM | POA: Diagnosis not present

## 2023-09-02 DIAGNOSIS — I700 Atherosclerosis of aorta: Secondary | ICD-10-CM | POA: Diagnosis not present

## 2023-09-08 ENCOUNTER — Encounter: Payer: Self-pay | Admitting: Dermatology

## 2023-09-08 DIAGNOSIS — I10 Essential (primary) hypertension: Secondary | ICD-10-CM | POA: Diagnosis not present

## 2023-09-08 DIAGNOSIS — E119 Type 2 diabetes mellitus without complications: Secondary | ICD-10-CM | POA: Diagnosis not present

## 2023-09-08 DIAGNOSIS — E782 Mixed hyperlipidemia: Secondary | ICD-10-CM | POA: Diagnosis not present

## 2023-09-08 DIAGNOSIS — Z794 Long term (current) use of insulin: Secondary | ICD-10-CM | POA: Diagnosis not present

## 2023-10-13 DIAGNOSIS — Z96652 Presence of left artificial knee joint: Secondary | ICD-10-CM | POA: Diagnosis not present

## 2023-10-13 DIAGNOSIS — M1711 Unilateral primary osteoarthritis, right knee: Secondary | ICD-10-CM | POA: Diagnosis not present

## 2023-10-13 DIAGNOSIS — D860 Sarcoidosis of lung: Secondary | ICD-10-CM | POA: Diagnosis not present

## 2023-10-13 DIAGNOSIS — M7052 Other bursitis of knee, left knee: Secondary | ICD-10-CM | POA: Diagnosis not present

## 2023-10-13 DIAGNOSIS — Z79899 Other long term (current) drug therapy: Secondary | ICD-10-CM | POA: Diagnosis not present

## 2023-10-16 ENCOUNTER — Encounter: Payer: Self-pay | Admitting: Dermatology

## 2023-10-27 DIAGNOSIS — J309 Allergic rhinitis, unspecified: Secondary | ICD-10-CM | POA: Diagnosis not present

## 2023-10-27 DIAGNOSIS — R21 Rash and other nonspecific skin eruption: Secondary | ICD-10-CM | POA: Diagnosis not present

## 2023-10-27 DIAGNOSIS — T50905D Adverse effect of unspecified drugs, medicaments and biological substances, subsequent encounter: Secondary | ICD-10-CM | POA: Diagnosis not present

## 2023-10-28 ENCOUNTER — Encounter: Payer: Self-pay | Admitting: Dermatology

## 2023-10-28 ENCOUNTER — Ambulatory Visit: Admitting: Dermatology

## 2023-10-28 DIAGNOSIS — L578 Other skin changes due to chronic exposure to nonionizing radiation: Secondary | ICD-10-CM

## 2023-10-28 DIAGNOSIS — L820 Inflamed seborrheic keratosis: Secondary | ICD-10-CM

## 2023-10-28 DIAGNOSIS — L918 Other hypertrophic disorders of the skin: Secondary | ICD-10-CM

## 2023-10-28 DIAGNOSIS — W908XXA Exposure to other nonionizing radiation, initial encounter: Secondary | ICD-10-CM | POA: Diagnosis not present

## 2023-10-28 DIAGNOSIS — C44329 Squamous cell carcinoma of skin of other parts of face: Secondary | ICD-10-CM | POA: Diagnosis not present

## 2023-10-28 DIAGNOSIS — L821 Other seborrheic keratosis: Secondary | ICD-10-CM

## 2023-10-28 DIAGNOSIS — D492 Neoplasm of unspecified behavior of bone, soft tissue, and skin: Secondary | ICD-10-CM | POA: Diagnosis not present

## 2023-10-28 DIAGNOSIS — C4492 Squamous cell carcinoma of skin, unspecified: Secondary | ICD-10-CM

## 2023-10-28 DIAGNOSIS — C44320 Squamous cell carcinoma of skin of unspecified parts of face: Secondary | ICD-10-CM

## 2023-10-28 DIAGNOSIS — L570 Actinic keratosis: Secondary | ICD-10-CM

## 2023-10-28 DIAGNOSIS — D485 Neoplasm of uncertain behavior of skin: Secondary | ICD-10-CM

## 2023-10-28 HISTORY — DX: Squamous cell carcinoma of skin, unspecified: C44.92

## 2023-10-28 NOTE — Patient Instructions (Signed)

## 2023-10-28 NOTE — Progress Notes (Signed)
 Follow-Up Visit   Subjective  George Daniels is a 78 y.o. male who presents for the following: Irregular skin lesion on the cheek, appeared after being treated with LN2. Patient concerned and would like checked today.   The patient has spots, moles and lesions to be evaluated, some may be new or changing and the patient may have concern these could be cancer.  The following portions of the chart were reviewed this encounter and updated as appropriate: medications, allergies, medical history  Review of Systems:  No other skin or systemic complaints except as noted in HPI or Assessment and Plan.  Objective  Well appearing patient in no apparent distress; mood and affect are within normal limits.  A focused examination was performed of the following areas: the face   Relevant exam findings are noted in the Assessment and Plan.  L zygoma Hyperkeratotic papule 1.5 cm  Face and ears x 8 (8) Erythematous thin papules/macules with gritty scale.  Scalp x 1, L forearm x 1 (2) Erythematous stuck-on, waxy papule or plaque L neck x 1 Fleshy, skin-colored pedunculated papules.    Assessment & Plan   SEBORRHEIC KERATOSIS - Stuck-on, waxy, tan-brown papules and/or plaques  - Benign-appearing - Discussed benign etiology and prognosis. - Observe - Call for any changes  ACTINIC DAMAGE - chronic, secondary to cumulative UV radiation exposure/sun exposure over time - diffuse scaly erythematous macules with underlying dyspigmentation - Recommend daily broad spectrum sunscreen SPF 30+ to sun-exposed areas, reapply every 2 hours as needed.  - Recommend staying in the shade or wearing long sleeves, sun glasses (UVA+UVB protection) and wide brim hats (4-inch brim around the entire circumference of the hat). - Call for new or changing lesions.  NEOPLASM OF UNCERTAIN BEHAVIOR OF SKIN L zygoma Epidermal / dermal shaving  Lesion diameter (cm):  1.5 Informed consent: discussed and consent  obtained   Timeout: patient name, date of birth, surgical site, and procedure verified   Procedure prep:  Patient was prepped and draped in usual sterile fashion Prep type:  Isopropyl alcohol Anesthesia: the lesion was anesthetized in a standard fashion   Anesthetic:  1% lidocaine  w/ epinephrine  1-100,000 buffered w/ 8.4% NaHCO3 Instrument used: DermaBlade   Hemostasis achieved with: pressure, aluminum chloride and electrodesiccation   Outcome: patient tolerated procedure well   Post-procedure details: sterile dressing applied and wound care instructions given   Dressing type: bandage (Mupirocin 2% ointment)    Destruction of lesion Complexity: extensive   Destruction method: electrodesiccation and curettage   Informed consent: discussed and consent obtained   Timeout:  patient name, date of birth, surgical site, and procedure verified Procedure prep:  Patient was prepped and draped in usual sterile fashion Prep type:  Isopropyl alcohol Anesthesia: the lesion was anesthetized in a standard fashion   Anesthetic:  1% lidocaine  w/ epinephrine  1-100,000 buffered w/ 8.4% NaHCO3 Curettage performed in three different directions: Yes   Electrodesiccation performed over the curetted area: Yes   Final wound size (cm):  1.5 Hemostasis achieved with:  pressure, aluminum chloride and electrodesiccation Outcome: patient tolerated procedure well with no complications   Post-procedure details: sterile dressing applied and wound care instructions given   Dressing type: bandage and petrolatum    Specimen 1 - Surgical pathology Differential Diagnosis: D48.5 r/o SCC vs ISK  ED&C today Check Margins: No AK (ACTINIC KERATOSIS) (8) Face and ears x 8 (8) Actinic keratoses are precancerous spots that appear secondary to cumulative UV radiation exposure/sun exposure over time.  They are chronic with expected duration over 1 year. A portion of actinic keratoses will progress to squamous cell carcinoma of the  skin. It is not possible to reliably predict which spots will progress to skin cancer and so treatment is recommended to prevent development of skin cancer.  Recommend daily broad spectrum sunscreen SPF 30+ to sun-exposed areas, reapply every 2 hours as needed.  Recommend staying in the shade or wearing long sleeves, sun glasses (UVA+UVB protection) and wide brim hats (4-inch brim around the entire circumference of the hat). Call for new or changing lesions.  Destruction of lesion - Face and ears x 8 (8) Complexity: simple   Destruction method: cryotherapy   Informed consent: discussed and consent obtained   Timeout:  patient name, date of birth, surgical site, and procedure verified Lesion destroyed using liquid nitrogen: Yes   Region frozen until ice ball extended beyond lesion: Yes   Outcome: patient tolerated procedure well with no complications   Post-procedure details: wound care instructions given    INFLAMED SEBORRHEIC KERATOSIS (2) Scalp x 1, L forearm x 1 (2) Symptomatic, irritating, patient would like treated.  Destruction of lesion - Scalp x 1, L forearm x 1 (2) Complexity: simple   Destruction method: cryotherapy   Informed consent: discussed and consent obtained   Timeout:  patient name, date of birth, surgical site, and procedure verified Lesion destroyed using liquid nitrogen: Yes   Region frozen until ice ball extended beyond lesion: Yes   Outcome: patient tolerated procedure well with no complications   Post-procedure details: wound care instructions given    SKIN TAG L neck x 1 Symptomatic, irritating, patient would like treated.  Destruction of lesion - L neck x 1 Complexity: simple   Destruction method: cryotherapy   Informed consent: discussed and consent obtained   Timeout:  patient name, date of birth, surgical site, and procedure verified Lesion destroyed using liquid nitrogen: Yes   Region frozen until ice ball extended beyond lesion: Yes   Outcome:  patient tolerated procedure well with no complications   Post-procedure details: wound care instructions given    ACTINIC SKIN DAMAGE   SEBORRHEIC KERATOSIS    Return for appointment as scheduled.  LILLETTE Rosina Mayans, CMA, am acting as scribe for Alm Rhyme, MD .   Documentation: I have reviewed the above documentation for accuracy and completeness, and I agree with the above.  Alm Rhyme, MD

## 2023-10-29 DIAGNOSIS — J3489 Other specified disorders of nose and nasal sinuses: Secondary | ICD-10-CM | POA: Diagnosis not present

## 2023-10-30 LAB — SURGICAL PATHOLOGY

## 2023-11-02 ENCOUNTER — Ambulatory Visit: Payer: Self-pay | Admitting: Dermatology

## 2023-11-02 ENCOUNTER — Encounter: Payer: Self-pay | Admitting: Dermatology

## 2023-11-02 NOTE — Telephone Encounter (Addendum)
 Called and discussed results with patient. He verbalized understanding and denied further questions. Will recheck at next follow up  ----- Message from Alm Rhyme sent at 11/02/2023  5:23 PM EDT ----- FINAL DIAGNOSIS        1. Skin, L zygoma :       WELL DIFFERENTIATED SQUAMOUS CELL CARCINOMA   Cancer = SCC Well differentiated Already treated Recheck next visit ----- Message ----- From: Interface, [facility] Sent: 10/30/2023   5:52 PM EDT To: Alm JAYSON Rhyme, MD

## 2023-11-03 DIAGNOSIS — E119 Type 2 diabetes mellitus without complications: Secondary | ICD-10-CM | POA: Diagnosis not present

## 2023-11-03 DIAGNOSIS — D869 Sarcoidosis, unspecified: Secondary | ICD-10-CM | POA: Diagnosis not present

## 2023-11-03 DIAGNOSIS — H43813 Vitreous degeneration, bilateral: Secondary | ICD-10-CM | POA: Diagnosis not present

## 2023-11-03 DIAGNOSIS — Z961 Presence of intraocular lens: Secondary | ICD-10-CM | POA: Diagnosis not present

## 2023-11-05 DIAGNOSIS — G8929 Other chronic pain: Secondary | ICD-10-CM | POA: Diagnosis not present

## 2023-11-05 DIAGNOSIS — M25562 Pain in left knee: Secondary | ICD-10-CM | POA: Diagnosis not present

## 2023-11-05 DIAGNOSIS — M25561 Pain in right knee: Secondary | ICD-10-CM | POA: Diagnosis not present

## 2023-11-13 DIAGNOSIS — S39012D Strain of muscle, fascia and tendon of lower back, subsequent encounter: Secondary | ICD-10-CM | POA: Diagnosis not present

## 2023-11-13 DIAGNOSIS — M25552 Pain in left hip: Secondary | ICD-10-CM | POA: Diagnosis not present

## 2023-11-16 DIAGNOSIS — G8929 Other chronic pain: Secondary | ICD-10-CM | POA: Diagnosis not present

## 2023-11-16 DIAGNOSIS — M25561 Pain in right knee: Secondary | ICD-10-CM | POA: Diagnosis not present

## 2023-11-16 DIAGNOSIS — M25562 Pain in left knee: Secondary | ICD-10-CM | POA: Diagnosis not present

## 2023-11-19 DIAGNOSIS — M25561 Pain in right knee: Secondary | ICD-10-CM | POA: Diagnosis not present

## 2023-11-19 DIAGNOSIS — G8929 Other chronic pain: Secondary | ICD-10-CM | POA: Diagnosis not present

## 2023-11-19 DIAGNOSIS — M25562 Pain in left knee: Secondary | ICD-10-CM | POA: Diagnosis not present

## 2023-11-24 DIAGNOSIS — M25561 Pain in right knee: Secondary | ICD-10-CM | POA: Diagnosis not present

## 2023-11-24 DIAGNOSIS — G8929 Other chronic pain: Secondary | ICD-10-CM | POA: Diagnosis not present

## 2023-11-24 DIAGNOSIS — M25562 Pain in left knee: Secondary | ICD-10-CM | POA: Diagnosis not present

## 2023-11-26 DIAGNOSIS — G8929 Other chronic pain: Secondary | ICD-10-CM | POA: Diagnosis not present

## 2023-11-26 DIAGNOSIS — M25561 Pain in right knee: Secondary | ICD-10-CM | POA: Diagnosis not present

## 2023-11-26 DIAGNOSIS — M25562 Pain in left knee: Secondary | ICD-10-CM | POA: Diagnosis not present

## 2023-11-30 DIAGNOSIS — G8929 Other chronic pain: Secondary | ICD-10-CM | POA: Diagnosis not present

## 2023-11-30 DIAGNOSIS — M25562 Pain in left knee: Secondary | ICD-10-CM | POA: Diagnosis not present

## 2023-11-30 DIAGNOSIS — M25561 Pain in right knee: Secondary | ICD-10-CM | POA: Diagnosis not present

## 2023-12-03 DIAGNOSIS — M25552 Pain in left hip: Secondary | ICD-10-CM | POA: Diagnosis not present

## 2023-12-04 DIAGNOSIS — M170 Bilateral primary osteoarthritis of knee: Secondary | ICD-10-CM | POA: Diagnosis not present

## 2023-12-04 DIAGNOSIS — Z96652 Presence of left artificial knee joint: Secondary | ICD-10-CM | POA: Diagnosis not present

## 2023-12-10 DIAGNOSIS — M25552 Pain in left hip: Secondary | ICD-10-CM | POA: Diagnosis not present

## 2023-12-17 DIAGNOSIS — M25552 Pain in left hip: Secondary | ICD-10-CM | POA: Diagnosis not present

## 2023-12-24 DIAGNOSIS — M25552 Pain in left hip: Secondary | ICD-10-CM | POA: Diagnosis not present

## 2023-12-28 DIAGNOSIS — E7800 Pure hypercholesterolemia, unspecified: Secondary | ICD-10-CM | POA: Diagnosis not present

## 2023-12-28 DIAGNOSIS — I10 Essential (primary) hypertension: Secondary | ICD-10-CM | POA: Diagnosis not present

## 2023-12-28 DIAGNOSIS — I825Y9 Chronic embolism and thrombosis of unspecified deep veins of unspecified proximal lower extremity: Secondary | ICD-10-CM | POA: Diagnosis not present

## 2023-12-28 DIAGNOSIS — E119 Type 2 diabetes mellitus without complications: Secondary | ICD-10-CM | POA: Diagnosis not present

## 2023-12-28 DIAGNOSIS — F325 Major depressive disorder, single episode, in full remission: Secondary | ICD-10-CM | POA: Diagnosis not present

## 2023-12-28 DIAGNOSIS — D869 Sarcoidosis, unspecified: Secondary | ICD-10-CM | POA: Diagnosis not present

## 2023-12-30 DIAGNOSIS — M25552 Pain in left hip: Secondary | ICD-10-CM | POA: Diagnosis not present

## 2024-01-06 DIAGNOSIS — D860 Sarcoidosis of lung: Secondary | ICD-10-CM | POA: Diagnosis not present

## 2024-01-07 DIAGNOSIS — M25552 Pain in left hip: Secondary | ICD-10-CM | POA: Diagnosis not present

## 2024-01-25 DIAGNOSIS — M4310 Spondylolisthesis, site unspecified: Secondary | ICD-10-CM | POA: Diagnosis not present

## 2024-01-25 DIAGNOSIS — M5416 Radiculopathy, lumbar region: Secondary | ICD-10-CM | POA: Diagnosis not present

## 2024-01-25 DIAGNOSIS — M4807 Spinal stenosis, lumbosacral region: Secondary | ICD-10-CM | POA: Diagnosis not present

## 2024-01-26 ENCOUNTER — Other Ambulatory Visit: Payer: Self-pay | Admitting: Family Medicine

## 2024-01-26 DIAGNOSIS — M5416 Radiculopathy, lumbar region: Secondary | ICD-10-CM

## 2024-01-30 ENCOUNTER — Ambulatory Visit
Admission: RE | Admit: 2024-01-30 | Discharge: 2024-01-30 | Disposition: A | Discharge status: Home / Self Care | Source: Ambulatory Visit | Attending: Family Medicine | Admitting: Family Medicine

## 2024-01-30 DIAGNOSIS — M5117 Intervertebral disc disorders with radiculopathy, lumbosacral region: Secondary | ICD-10-CM | POA: Diagnosis not present

## 2024-01-30 DIAGNOSIS — M4316 Spondylolisthesis, lumbar region: Secondary | ICD-10-CM | POA: Diagnosis not present

## 2024-01-30 DIAGNOSIS — M48061 Spinal stenosis, lumbar region without neurogenic claudication: Secondary | ICD-10-CM | POA: Diagnosis not present

## 2024-01-30 DIAGNOSIS — M5416 Radiculopathy, lumbar region: Secondary | ICD-10-CM

## 2024-02-15 DIAGNOSIS — M48062 Spinal stenosis, lumbar region with neurogenic claudication: Secondary | ICD-10-CM | POA: Diagnosis not present

## 2024-02-15 DIAGNOSIS — M5416 Radiculopathy, lumbar region: Secondary | ICD-10-CM | POA: Diagnosis not present

## 2024-03-08 DIAGNOSIS — I825Y9 Chronic embolism and thrombosis of unspecified deep veins of unspecified proximal lower extremity: Secondary | ICD-10-CM | POA: Diagnosis not present

## 2024-03-08 DIAGNOSIS — E118 Type 2 diabetes mellitus with unspecified complications: Secondary | ICD-10-CM | POA: Diagnosis not present

## 2024-03-14 DIAGNOSIS — E782 Mixed hyperlipidemia: Secondary | ICD-10-CM | POA: Diagnosis not present

## 2024-03-14 DIAGNOSIS — I10 Essential (primary) hypertension: Secondary | ICD-10-CM | POA: Diagnosis not present

## 2024-03-14 DIAGNOSIS — E1142 Type 2 diabetes mellitus with diabetic polyneuropathy: Secondary | ICD-10-CM | POA: Diagnosis not present

## 2024-03-18 DIAGNOSIS — M48062 Spinal stenosis, lumbar region with neurogenic claudication: Secondary | ICD-10-CM | POA: Diagnosis not present

## 2024-03-18 DIAGNOSIS — M5416 Radiculopathy, lumbar region: Secondary | ICD-10-CM | POA: Diagnosis not present

## 2024-04-26 ENCOUNTER — Ambulatory Visit: Admitting: Dermatology

## 2024-04-26 DIAGNOSIS — D1801 Hemangioma of skin and subcutaneous tissue: Secondary | ICD-10-CM | POA: Diagnosis not present

## 2024-04-26 DIAGNOSIS — L820 Inflamed seborrheic keratosis: Secondary | ICD-10-CM

## 2024-04-26 DIAGNOSIS — Z85828 Personal history of other malignant neoplasm of skin: Secondary | ICD-10-CM | POA: Diagnosis not present

## 2024-04-26 DIAGNOSIS — Z8589 Personal history of malignant neoplasm of other organs and systems: Secondary | ICD-10-CM

## 2024-04-26 DIAGNOSIS — D229 Melanocytic nevi, unspecified: Secondary | ICD-10-CM

## 2024-04-26 DIAGNOSIS — L814 Other melanin hyperpigmentation: Secondary | ICD-10-CM

## 2024-04-26 DIAGNOSIS — W908XXA Exposure to other nonionizing radiation, initial encounter: Secondary | ICD-10-CM

## 2024-04-26 DIAGNOSIS — D692 Other nonthrombocytopenic purpura: Secondary | ICD-10-CM | POA: Diagnosis not present

## 2024-04-26 DIAGNOSIS — L578 Other skin changes due to chronic exposure to nonionizing radiation: Secondary | ICD-10-CM | POA: Diagnosis not present

## 2024-04-26 DIAGNOSIS — L821 Other seborrheic keratosis: Secondary | ICD-10-CM

## 2024-04-26 DIAGNOSIS — Z1283 Encounter for screening for malignant neoplasm of skin: Secondary | ICD-10-CM | POA: Diagnosis not present

## 2024-04-26 DIAGNOSIS — L570 Actinic keratosis: Secondary | ICD-10-CM

## 2024-04-26 NOTE — Patient Instructions (Addendum)

## 2024-04-26 NOTE — Progress Notes (Unsigned)
 "  Follow-Up Visit   Subjective  George Daniels is a 79 y.o. male who presents for the following: Skin Cancer Screening and Full Body Skin Exam, hx of SCC  The patient presents for Total-Body Skin Exam (TBSE) for skin cancer screening and mole check. The patient has spots, moles and lesions to be evaluated, some may be new or changing and the patient may have concern these could be cancer.  The following portions of the chart were reviewed this encounter and updated as appropriate: medications, allergies, medical history  Review of Systems:  No other skin or systemic complaints except as noted in HPI or Assessment and Plan.  Objective  Well appearing patient in no apparent distress; mood and affect are within normal limits.  A full examination was performed including scalp, head, eyes, ears, nose, lips, neck, chest, axillae, abdomen, back, buttocks, bilateral upper extremities, bilateral lower extremities, hands, feet, fingers, toes, fingernails, and toenails. All findings within normal limits unless otherwise noted below.   Relevant physical exam findings are noted in the Assessment and Plan.  face,scalp,ears x 17 (17) Erythematous thin papules/macules with gritty scale.  scalp x 2 (2) Stuck-on, waxy, tan-brown papules and plaques -- Discussed benign etiology and prognosis.   Assessment & Plan   SKIN CANCER SCREENING PERFORMED TODAY.  ACTINIC DAMAGE - Chronic condition, secondary to cumulative UV/sun exposure - diffuse scaly erythematous macules with underlying dyspigmentation - Recommend daily broad spectrum sunscreen SPF 30+ to sun-exposed areas, reapply every 2 hours as needed.  - Staying in the shade or wearing long sleeves, sun glasses (UVA+UVB protection) and wide brim hats (4-inch brim around the entire circumference of the hat) are also recommended for sun protection.  - Call for new or changing lesions.  LENTIGINES, SEBORRHEIC KERATOSES, HEMANGIOMAS - Benign normal skin  lesions - Benign-appearing - Call for any changes  MELANOCYTIC NEVI - Tan-brown and/or pink-flesh-colored symmetric macules and papules - Benign appearing on exam today - Observation - Call clinic for new or changing moles - Recommend daily use of broad spectrum spf 30+ sunscreen to sun-exposed areas.   Purpura - Chronic; persistent and recurrent.  Treatable, but not curable. Arms - Violaceous macules and patches - Benign - Related to trauma, age, sun damage and/or use of blood thinners, chronic use of topical and/or oral steroids - Observe - Can use OTC arnica containing moisturizer such as Dermend Bruise Formula if desired - Call for worsening or other concerns   HISTORY OF SQUAMOUS CELL CARCINOMA OF THE SKIN Left zygoma - No evidence of recurrence today - No lymphadenopathy - Recommend regular full body skin exams - Recommend daily broad spectrum sunscreen SPF 30+ to sun-exposed areas, reapply every 2 hours as needed.  - Call if any new or changing lesions are noted between office visits    AK (ACTINIC KERATOSIS) (17) face,scalp,ears x 17 (17) ACTINIC DAMAGE - chronic, secondary to cumulative UV radiation exposure/sun exposure over time - diffuse scaly erythematous macules with underlying dyspigmentation - Recommend daily broad spectrum sunscreen SPF 30+ to sun-exposed areas, reapply every 2 hours as needed.  - Recommend staying in the shade or wearing long sleeves, sun glasses (UVA+UVB protection) and wide brim hats (4-inch brim around the entire circumference of the hat). - Call for new or changing lesions.   May consider field treatment in the future  - Destruction of lesion - face,scalp,ears x 17 (17) Complexity: simple   Destruction method: cryotherapy   Informed consent: discussed and consent obtained  Timeout:  patient name, date of birth, surgical site, and procedure verified Lesion destroyed using liquid nitrogen: Yes   Region frozen until ice ball extended  beyond lesion: Yes   Outcome: patient tolerated procedure well with no complications   Post-procedure details: wound care instructions given    INFLAMED SEBORRHEIC KERATOSIS (2) scalp x 2 (2) Symptomatic, irritating, patient would like treated.  - Destruction of lesion - scalp x 2 (2) Complexity: simple   Destruction method: cryotherapy   Informed consent: discussed and consent obtained   Timeout:  patient name, date of birth, surgical site, and procedure verified Lesion destroyed using liquid nitrogen: Yes   Region frozen until ice ball extended beyond lesion: Yes   Outcome: patient tolerated procedure well with no complications   Post-procedure details: wound care instructions given     Return in about 6 months (around 10/24/2024) for UBSE, hx of SCC, hx of AKs .  IFay Daniels, CMA, am acting as scribe for Alm Rhyme, MD .   Documentation: I have reviewed the above documentation for accuracy and completeness, and I agree with the above.  Alm Rhyme, MD    "

## 2024-04-27 ENCOUNTER — Encounter: Payer: Self-pay | Admitting: Dermatology

## 2024-07-06 ENCOUNTER — Ambulatory Visit: Admitting: Dermatology

## 2024-10-24 ENCOUNTER — Ambulatory Visit: Admitting: Dermatology
